# Patient Record
Sex: Male | Born: 1955
Health system: Southern US, Community
[De-identification: ages and names within clinical notes are randomized; demographics above are authoritative.]

## PROBLEM LIST (undated history)

## (undated) DIAGNOSIS — H269 Unspecified cataract: Secondary | ICD-10-CM

## (undated) HISTORY — DX: Unspecified cataract: H26.9

## (undated) HISTORY — PX: WISDOM TOOTH EXTRACTION: SHX21

---

## 2019-12-13 ENCOUNTER — Other Ambulatory Visit: Payer: Self-pay

## 2019-12-13 ENCOUNTER — Emergency Department (HOSPITAL_COMMUNITY): Payer: Medicaid Other

## 2019-12-13 ENCOUNTER — Inpatient Hospital Stay (HOSPITAL_COMMUNITY)
Admission: EM | Admit: 2019-12-13 | Discharge: 2019-12-23 | DRG: 638 | Disposition: A | Discharge status: Home Health | Payer: Medicaid Other | Attending: Family Medicine | Admitting: Family Medicine

## 2019-12-13 ENCOUNTER — Encounter (HOSPITAL_COMMUNITY): Payer: Self-pay | Admitting: Emergency Medicine

## 2019-12-13 DIAGNOSIS — R000 Tachycardia, unspecified: Secondary | ICD-10-CM

## 2019-12-13 DIAGNOSIS — R8281 Pyuria: Secondary | ICD-10-CM | POA: Diagnosis present

## 2019-12-13 DIAGNOSIS — K047 Periapical abscess without sinus: Secondary | ICD-10-CM | POA: Diagnosis present

## 2019-12-13 DIAGNOSIS — E1110 Type 2 diabetes mellitus with ketoacidosis without coma: Principal | ICD-10-CM | POA: Diagnosis present

## 2019-12-13 DIAGNOSIS — E861 Hypovolemia: Secondary | ICD-10-CM | POA: Diagnosis present

## 2019-12-13 DIAGNOSIS — R432 Parageusia: Secondary | ICD-10-CM | POA: Diagnosis present

## 2019-12-13 DIAGNOSIS — N390 Urinary tract infection, site not specified: Secondary | ICD-10-CM | POA: Diagnosis present

## 2019-12-13 DIAGNOSIS — R319 Hematuria, unspecified: Secondary | ICD-10-CM | POA: Diagnosis present

## 2019-12-13 DIAGNOSIS — N179 Acute kidney failure, unspecified: Secondary | ICD-10-CM | POA: Diagnosis present

## 2019-12-13 DIAGNOSIS — N1832 Chronic kidney disease, stage 3b: Secondary | ICD-10-CM | POA: Diagnosis present

## 2019-12-13 DIAGNOSIS — Z283 Underimmunization status: Secondary | ICD-10-CM

## 2019-12-13 DIAGNOSIS — I471 Supraventricular tachycardia: Secondary | ICD-10-CM | POA: Diagnosis present

## 2019-12-13 DIAGNOSIS — E1165 Type 2 diabetes mellitus with hyperglycemia: Secondary | ICD-10-CM | POA: Diagnosis present

## 2019-12-13 DIAGNOSIS — E1169 Type 2 diabetes mellitus with other specified complication: Secondary | ICD-10-CM | POA: Diagnosis present

## 2019-12-13 DIAGNOSIS — R439 Unspecified disturbances of smell and taste: Secondary | ICD-10-CM | POA: Diagnosis present

## 2019-12-13 DIAGNOSIS — E119 Type 2 diabetes mellitus without complications: Secondary | ICD-10-CM

## 2019-12-13 DIAGNOSIS — E669 Obesity, unspecified: Secondary | ICD-10-CM | POA: Diagnosis present

## 2019-12-13 DIAGNOSIS — Z6835 Body mass index (BMI) 35.0-35.9, adult: Secondary | ICD-10-CM

## 2019-12-13 DIAGNOSIS — E0810 Diabetes mellitus due to underlying condition with ketoacidosis without coma: Secondary | ICD-10-CM

## 2019-12-13 DIAGNOSIS — R430 Anosmia: Secondary | ICD-10-CM | POA: Diagnosis present

## 2019-12-13 DIAGNOSIS — E876 Hypokalemia: Secondary | ICD-10-CM | POA: Diagnosis not present

## 2019-12-13 DIAGNOSIS — E785 Hyperlipidemia, unspecified: Secondary | ICD-10-CM | POA: Diagnosis present

## 2019-12-13 DIAGNOSIS — D751 Secondary polycythemia: Secondary | ICD-10-CM | POA: Diagnosis present

## 2019-12-13 DIAGNOSIS — R600 Localized edema: Secondary | ICD-10-CM | POA: Diagnosis present

## 2019-12-13 DIAGNOSIS — E860 Dehydration: Secondary | ICD-10-CM | POA: Diagnosis present

## 2019-12-13 DIAGNOSIS — E872 Acidosis: Secondary | ICD-10-CM

## 2019-12-13 DIAGNOSIS — D1779 Benign lipomatous neoplasm of other sites: Secondary | ICD-10-CM | POA: Diagnosis present

## 2019-12-13 DIAGNOSIS — Z23 Encounter for immunization: Secondary | ICD-10-CM

## 2019-12-13 DIAGNOSIS — E1310 Other specified diabetes mellitus with ketoacidosis without coma: Secondary | ICD-10-CM

## 2019-12-13 DIAGNOSIS — E1129 Type 2 diabetes mellitus with other diabetic kidney complication: Secondary | ICD-10-CM | POA: Diagnosis present

## 2019-12-13 DIAGNOSIS — R1909 Other intra-abdominal and pelvic swelling, mass and lump: Secondary | ICD-10-CM

## 2019-12-13 DIAGNOSIS — E870 Hyperosmolality and hypernatremia: Secondary | ICD-10-CM | POA: Diagnosis present

## 2019-12-13 DIAGNOSIS — B951 Streptococcus, group B, as the cause of diseases classified elsewhere: Secondary | ICD-10-CM | POA: Diagnosis present

## 2019-12-13 DIAGNOSIS — R7989 Other specified abnormal findings of blood chemistry: Secondary | ICD-10-CM | POA: Diagnosis present

## 2019-12-13 DIAGNOSIS — E1122 Type 2 diabetes mellitus with diabetic chronic kidney disease: Secondary | ICD-10-CM | POA: Diagnosis present

## 2019-12-13 DIAGNOSIS — R739 Hyperglycemia, unspecified: Secondary | ICD-10-CM

## 2019-12-13 DIAGNOSIS — Z79899 Other long term (current) drug therapy: Secondary | ICD-10-CM

## 2019-12-13 DIAGNOSIS — Z20822 Contact with and (suspected) exposure to covid-19: Secondary | ICD-10-CM | POA: Diagnosis present

## 2019-12-13 DIAGNOSIS — I959 Hypotension, unspecified: Secondary | ICD-10-CM | POA: Diagnosis present

## 2019-12-13 DIAGNOSIS — R1110 Vomiting, unspecified: Secondary | ICD-10-CM

## 2019-12-13 DIAGNOSIS — E8729 Other acidosis: Secondary | ICD-10-CM | POA: Insufficient documentation

## 2019-12-13 DIAGNOSIS — I517 Cardiomegaly: Secondary | ICD-10-CM | POA: Diagnosis present

## 2019-12-13 LAB — BASIC METABOLIC PANEL
Anion gap: 29 — ABNORMAL HIGH (ref 5–15)
Anion gap: 28 — ABNORMAL HIGH (ref 5–15)
BUN: 30 mg/dL — ABNORMAL HIGH (ref 8–23)
BUN: 33 mg/dL — ABNORMAL HIGH (ref 8–23)
CO2: 9 mmol/L — ABNORMAL LOW (ref 22–32)
CO2: 9 mmol/L — ABNORMAL LOW (ref 22–32)
Calcium: 10 mg/dL (ref 8.9–10.3)
Calcium: 9.8 mg/dL (ref 8.9–10.3)
Chloride: 106 mmol/L (ref 98–111)
Chloride: 111 mmol/L (ref 98–111)
Creatinine, Ser: 1.84 mg/dL — ABNORMAL HIGH (ref 0.61–1.24)
Creatinine, Ser: 1.94 mg/dL — ABNORMAL HIGH (ref 0.61–1.24)
GFR calc Af Amer: 44 mL/min — ABNORMAL LOW (ref >60)
GFR calc Af Amer: 41 mL/min — ABNORMAL LOW (ref >60)
GFR calc non Af Amer: 38 mL/min — ABNORMAL LOW (ref >60)
GFR calc non Af Amer: 36 mL/min — ABNORMAL LOW (ref >60)
Glucose, Bld: 566 mg/dL (ref 70–99)
Glucose, Bld: 482 mg/dL — ABNORMAL HIGH (ref 70–99)
Potassium: 4.7 mmol/L (ref 3.5–5.1)
Potassium: 4.9 mmol/L (ref 3.5–5.1)
Sodium: 144 mmol/L (ref 135–145)
Sodium: 148 mmol/L — ABNORMAL HIGH (ref 135–145)

## 2019-12-13 LAB — I-STAT VENOUS BLOOD GAS, ED
Acid-base deficit: 13 mmol/L — ABNORMAL HIGH (ref 0.0–2.0)
Bicarbonate: 10.9 mmol/L — ABNORMAL LOW (ref 20.0–28.0)
Calcium, Ion: 1.19 mmol/L (ref 1.15–1.40)
HCT: 56 % — ABNORMAL HIGH (ref 39.0–52.0)
Hemoglobin: 19 g/dL — ABNORMAL HIGH (ref 13.0–17.0)
O2 Saturation: 86 %
Potassium: 4.8 mmol/L (ref 3.5–5.1)
Sodium: 150 mmol/L — ABNORMAL HIGH (ref 135–145)
TCO2: 12 mmol/L — ABNORMAL LOW (ref 22–32)
pCO2, Ven: 22.1 mmHg — ABNORMAL LOW (ref 44.0–60.0)
pH, Ven: 7.302 (ref 7.250–7.430)
pO2, Ven: 55 mmHg — ABNORMAL HIGH (ref 32.0–45.0)

## 2019-12-13 LAB — CBG MONITORING, ED
Glucose-Capillary: 556 mg/dL (ref 70–99)
Glucose-Capillary: 490 mg/dL — ABNORMAL HIGH (ref 70–99)
Glucose-Capillary: 490 mg/dL — ABNORMAL HIGH (ref 70–99)
Glucose-Capillary: 423 mg/dL — ABNORMAL HIGH (ref 70–99)
Glucose-Capillary: 423 mg/dL — ABNORMAL HIGH (ref 70–99)
Glucose-Capillary: 338 mg/dL — ABNORMAL HIGH (ref 70–99)
Glucose-Capillary: 312 mg/dL — ABNORMAL HIGH (ref 70–99)
Glucose-Capillary: 288 mg/dL — ABNORMAL HIGH (ref 70–99)
Glucose-Capillary: 280 mg/dL — ABNORMAL HIGH (ref 70–99)

## 2019-12-13 LAB — SARS CORONAVIRUS 2 BY RT PCR (HOSPITAL ORDER, PERFORMED IN ~~LOC~~ HOSPITAL LAB): SARS Coronavirus 2: NEGATIVE

## 2019-12-13 LAB — URINALYSIS, ROUTINE W REFLEX MICROSCOPIC
Bacteria, UA: NONE SEEN
Bilirubin Urine: NEGATIVE
Glucose, UA: >=500 mg/dL — AB
Ketones, ur: 80 mg/dL — AB
Leukocytes,Ua: NEGATIVE
Nitrite: NEGATIVE
Protein, ur: 100 mg/dL — AB
RBC / HPF: >50 RBC/hpf — ABNORMAL HIGH (ref 0–5)
Specific Gravity, Urine: 1.03 (ref 1.005–1.030)
pH: 5 (ref 5.0–8.0)

## 2019-12-13 LAB — CBC
HCT: 59.3 % — ABNORMAL HIGH (ref 39.0–52.0)
Hemoglobin: 18.8 g/dL — ABNORMAL HIGH (ref 13.0–17.0)
MCH: 30 pg (ref 26.0–34.0)
MCHC: 31.7 g/dL (ref 30.0–36.0)
MCV: 94.7 fL (ref 80.0–100.0)
Platelets: 265 10*3/uL (ref 150–400)
RBC: 6.26 MIL/uL — ABNORMAL HIGH (ref 4.22–5.81)
RDW: 12.8 % (ref 11.5–15.5)
WBC: 12.1 10*3/uL — ABNORMAL HIGH (ref 4.0–10.5)
nRBC: 0 % (ref 0.0–0.2)

## 2019-12-13 LAB — CBC WITH DIFFERENTIAL/PLATELET
Abs Immature Granulocytes: 0.06 10*3/uL (ref 0.00–0.07)
Basophils Absolute: 0 10*3/uL (ref 0.0–0.1)
Basophils Relative: 0 %
Eosinophils Absolute: 0 10*3/uL (ref 0.0–0.5)
Eosinophils Relative: 0 %
HCT: 57.6 % — ABNORMAL HIGH (ref 39.0–52.0)
Hemoglobin: 18.6 g/dL — ABNORMAL HIGH (ref 13.0–17.0)
Immature Granulocytes: 0 %
Lymphocytes Relative: 7 %
Lymphs Abs: 1 10*3/uL (ref 0.7–4.0)
MCH: 30.7 pg (ref 26.0–34.0)
MCHC: 32.3 g/dL (ref 30.0–36.0)
MCV: 95 fL (ref 80.0–100.0)
Monocytes Absolute: 1 10*3/uL (ref 0.1–1.0)
Monocytes Relative: 7 %
Neutro Abs: 12.3 10*3/uL — ABNORMAL HIGH (ref 1.7–7.7)
Neutrophils Relative %: 86 %
Platelets: 237 10*3/uL (ref 150–400)
RBC: 6.06 MIL/uL — ABNORMAL HIGH (ref 4.22–5.81)
RDW: 13.2 % (ref 11.5–15.5)
WBC: 14.4 10*3/uL — ABNORMAL HIGH (ref 4.0–10.5)
nRBC: 0 % (ref 0.0–0.2)

## 2019-12-13 LAB — LIPID PANEL
Cholesterol: 304 mg/dL — ABNORMAL HIGH (ref 0–200)
HDL: 33 mg/dL — ABNORMAL LOW (ref >40)
LDL Cholesterol: 211 mg/dL — ABNORMAL HIGH (ref 0–99)
Total CHOL/HDL Ratio: 9.2 RATIO
Triglycerides: 300 mg/dL — ABNORMAL HIGH (ref <150)
VLDL: 60 mg/dL — ABNORMAL HIGH (ref 0–40)

## 2019-12-13 LAB — HEPATIC FUNCTION PANEL
ALT: 47 U/L — ABNORMAL HIGH (ref 0–44)
AST: 26 U/L (ref 15–41)
Albumin: 3.9 g/dL (ref 3.5–5.0)
Alkaline Phosphatase: 147 U/L — ABNORMAL HIGH (ref 38–126)
Bilirubin, Direct: 0.2 mg/dL (ref 0.0–0.2)
Indirect Bilirubin: 2.2 mg/dL — ABNORMAL HIGH (ref 0.3–0.9)
Total Bilirubin: 2.4 mg/dL — ABNORMAL HIGH (ref 0.3–1.2)
Total Protein: 8.9 g/dL — ABNORMAL HIGH (ref 6.5–8.1)

## 2019-12-13 LAB — HEMOGLOBIN A1C
Hgb A1c MFr Bld: 13.2 % — ABNORMAL HIGH (ref 4.8–5.6)
Mean Plasma Glucose: 332.14 mg/dL

## 2019-12-13 LAB — BETA-HYDROXYBUTYRIC ACID: Beta-Hydroxybutyric Acid: >8 mmol/L — ABNORMAL HIGH (ref 0.05–0.27)

## 2019-12-13 LAB — BRAIN NATRIURETIC PEPTIDE: B Natriuretic Peptide: 686.3 pg/mL — ABNORMAL HIGH (ref 0.0–100.0)

## 2019-12-13 LAB — HIV ANTIBODY (ROUTINE TESTING W REFLEX): HIV Screen 4th Generation wRfx: NONREACTIVE

## 2019-12-13 MED ORDER — LACTATED RINGERS IV SOLN: INTRAVENOUS | Status: DC

## 2019-12-13 MED ORDER — POTASSIUM CHLORIDE 10 MEQ/100ML IV SOLN
10.0000 meq | INTRAVENOUS | Status: AC
  Administered 2019-12-13 (×2): 10 meq via INTRAVENOUS
  Filled 2019-12-13: qty 100

## 2019-12-13 MED ORDER — POTASSIUM CHLORIDE 10 MEQ/100ML IV SOLN
10.0000 meq | INTRAVENOUS | Status: AC
  Filled 2019-12-13: qty 100

## 2019-12-13 MED ORDER — SODIUM CHLORIDE 0.9 % IV BOLUS
1000.0000 mL | Freq: Once | INTRAVENOUS | Status: AC
  Administered 2019-12-13: 1000 mL via INTRAVENOUS

## 2019-12-13 MED ORDER — LACTATED RINGERS IV BOLUS
20.0000 mL/kg | Freq: Once | INTRAVENOUS | Status: AC
  Administered 2019-12-13: 1000 mL via INTRAVENOUS

## 2019-12-13 MED ORDER — DEXTROSE IN LACTATED RINGERS 5 % IV SOLN: INTRAVENOUS | Status: DC

## 2019-12-13 MED ORDER — DEXTROSE 50 % IV SOLN: 0.0000 mL | INTRAVENOUS | Status: DC | PRN

## 2019-12-13 MED ORDER — INSULIN REGULAR(HUMAN) IN NACL 100-0.9 UT/100ML-% IV SOLN
INTRAVENOUS | Status: DC
  Administered 2019-12-13: 6 [IU]/h via INTRAVENOUS
  Administered 2019-12-14: 3.6 [IU]/h via INTRAVENOUS
  Filled 2019-12-13 (×2): qty 100

## 2019-12-13 MED ORDER — ENOXAPARIN SODIUM 40 MG/0.4ML ~~LOC~~ SOLN
40.0000 mg | SUBCUTANEOUS | Status: DC
  Administered 2019-12-14: 40 mg via SUBCUTANEOUS
  Filled 2019-12-13: qty 0.4

## 2019-12-13 NOTE — ED Triage Notes (Signed)
Pt reports no taste or smell x 2 weeks with generalized weakness and numbness to bilateral feet.  Denies pain.

## 2019-12-13 NOTE — H&P (Addendum)
[facility] Admission History and Physical Service Pager: [phone]  Patient name: Bill Torres Medical record number: [id number] Date of birth: 02/20/1956 Age: 64 y.o. Gender: male  Primary Care Provider: Patient, No Pcp Per Consultants: None Code Status: Full Preferred Emergency Contact: Kallie Locks (daughter) [phone]  Chief Complaint: Generalized weakness  Assessment and Plan: Isao Seltzer is a 64 y.o. male presenting with generalized weakness and hyperglycemia, likely DKA. PMH is significant for obesity. Has never seen a doctor.  DKA  Newly diagnosed T2DM Patient presenting with tachycardia and hyperglycemia with glucose 566 on presentation with anion gap metabolic acidosis (bicarb 9, anion gap 29, pH decreased at 7.3).  EKG showing sinus tachycardia without arrhythmia, likely in the setting of dehydration.  Suspect newly diagnosed DM in DKA, BHBA significantly elevated at >8.  UA on admission showing greater than 500 glucose, 80 ketones, 100 protein, no evidence of infection.  Mild leukocytosis with WBC 12.1 which can be seen with DKA.  Covid negative. Patient was started on insulin drip per protocol in the ED. - admit to progressive, FPTS, Dr. McDiarmid - continue insulin drip per protocol - CBG monitoring per protocol - f/u BHB q8h - BMP q4h - other labs: HbA1c, urine microalbumin - strict I/O - cardiac monitoring, continuous pulse ox - vitals per unit routine  Anosmia  unvaccinated against Covid-19 Patient reports several days of loss of smell, taste.  No known exposures to COVID-19.  Covid test on admission is negative.  He is unvaccinated.  No symptoms of cough, chest pain, dyspnea.  CXR unremarkable, other than some possible atelectasis.  Tachycardic, but otherwise stable.  Breathing comfortably on room air.  Unclear etiology of anosmia. -Continue to monitor -Patient would benefit from inpatient COVID-19 vaccine if  amendable  ?AKI, CKD3b Elevated creatinine 1.84 on admission, unclear baseline.  Likely prerenal in the setting of dehydration, though creatinine level could be patient's baseline.  Could possibly have underlying CKD given lack of medical care.  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  - IV fluids - BMP - Continue to monitor fluid status  Hyperlipidemia: Lipid panel on admission shows cholesterol 304, triglycerides 300, HDL low at 33, VLDL elevated at 60, and LDL elevated at 211.  Patient's ASCVD risk 16%. -Patient would benefit from statin once no longer n.p.o.  Lower extremity edema Patient with 1+ pitting edema bilaterally on exam.  CXR without evidence of pulmonary edema.  BNP elevated at 686. -Recommend following up with TTE  FEN/GI: NPO, insulin drip per protocol Prophylaxis: enoxaparin  Disposition: progressive  History of Present Illness:  Bill Torres is a 64 y.o. male presenting with generalized weakness and fatigue.  Patient has noticed decreased smell and taste for the past 2 weeks.  He has not been eating as much as a result, and has been eating primarily fruit and drinking ginger ale to try to get his taste back.  He does have some taste but states it is very diminished.  As a result, he has also had fatigue and generalized weakness as well.  He also endorses polyuria (sometimes voiding 3 times in a night).  Denies nausea, vomiting.  He is not vaccinated against COVID. No known exposures.  Patient states he mostly stays at home and always wears a mask when he leaves the house.  Denies chest pain, shortness of breath, cough, diarrhea, vision changes.  Nobody at home is ill, no sick contacts.  Review Of Systems: Per HPI  with the following additions: no headache, neck pain  Patient Active Problem List   Diagnosis Date Noted  . Diabetic ketoacidosis associated with diabetes mellitus due to underlying condition ([place]) 12/13/2019    Past Medical  History: History reviewed. No pertinent past medical history. No known PMH Takes multiple vitamins  Past Surgical History: History reviewed. No pertinent surgical history.  No surgical history  Social History: Social History   Tobacco Use  . Smoking status: Never Smoker  . Smokeless tobacco: Never Used  Substance Use Topics  . Alcohol use: Not Currently  . Drug use: Not Currently   Additional social history: lives with daughter and 2 children.  Denies smoking, alcohol use, recreational drug use. Please also refer to relevant sections of EMR.  Family History: No family history on file. Mom: no known medical history Dad: no known medical history Daughter: T58DM, 74 years old  Allergies and Medications: No Known Allergies No current facility-administered medications on file prior to encounter.   No current outpatient medications on file prior to encounter.  No medications  Objective: BP (!) 138/107 (BP Location: Right Arm)   Pulse (!) 133   Temp 99.4 F (37.4 C) (Oral)   Resp 16   Ht 6\' 1"  (1.854 m)   Wt 120.2 kg   SpO2 97%   BMI 34.96 kg/m  Exam: General: Obese male lying in bed, NAD Eyes: EOMI ENTM: MM dry Neck: supple Cardiovascular: Tachycardic, regular rhythm, no murmurs, pulses intact, 1+ pitting edema Respiratory: CTAB, breathing comfortably on room air, no tachypnea Gastrointestinal: soft, non-tender, +BS MSK: moving extremities Derm: no rash, cool extremities Neuro: alert, cooperative, interactive, sensation intact to bilateral feet to light touch, CN II-XII grossly intact Psych: affect appropriate  Labs and Imaging: CBC BMET  Recent Labs  Lab 12/13/19 0856  WBC 12.1*  HGB 18.8*  HCT 59.3*  PLT 265   Recent Labs  Lab 12/13/19 0856  NA 144  K 4.7  CL 106  CO2 9*  BUN 30*  CREATININE 1.84*  GLUCOSE 566*  CALCIUM 10.0     EKG:  Date/Time:  Saturday December 13 2019 08:58:50 EDT Ventricular Rate:  131 PR Interval:  144 QRS  Duration: 76 QT Interval:  296 QTC Calculation: 437 R Axis:   5 Text Interpretation: Sinus tachycardia Otherwise normal ECG No prior ECG for comparison. No STEMI Confirmed by Antony Blackbird [phone]) on 12/13/2019 12:56:03 PM   Zola Button, MD 12/13/2019, 4:16 PM PGY-1, [place] Intern pager: [phone], text pages welcome   FPTS Upper-Level Resident Addendum  I have independently interviewed and examined the patient. I have discussed the above with the original author and agree with their documentation. My edits for correction/addition/clarification are in italics. Please see also any attending notes.    Milus Banister, DO PGY-3, [facility] 12/13/2019 7:58 PM  [place] Service pager: [phone] (text pages welcome through [facility])

## 2019-12-13 NOTE — ED Provider Notes (Signed)
[name] Provider Note   CSN: [id number] Arrival date & time: 12/13/19  6283     History Chief Complaint  Patient presents with  . Weakness    Bill Torres is a 64 y.o. male.  The history is provided by the patient and medical records. No language interpreter was used.  Hyperglycemia Blood sugar level PTA:  Not checked Severity:  Severe Timing:  Constant Progression:  Unchanged Chronicity:  New Diabetes status:  Non-diabetic Context: new diabetes diagnosis (today)   Context: not change in medication   Relieved by:  Nothing Ineffective treatments:  None tried Associated symptoms: dehydration, fatigue, increased thirst and polyuria   Associated symptoms: no abdominal pain, no altered mental status, no blurred vision, no chest pain, no confusion, no diaphoresis, no dizziness, no dysuria, no fever, no malaise, no nausea, no shortness of breath, no vomiting, no weakness and no weight change   Risk factors: obesity        History reviewed. No pertinent past medical history.  There are no problems to display for this patient.   History reviewed. No pertinent surgical history.     No family history on file.  Social History   Tobacco Use  . Smoking status: Never Smoker  . Smokeless tobacco: Never Used  Substance Use Topics  . Alcohol use: Not Currently  . Drug use: Not Currently    Home Medications Prior to Admission medications   Not on File    Allergies    Patient has no allergy information on record.  Review of Systems   Review of Systems  Constitutional: Positive for fatigue. Negative for chills, diaphoresis and fever.  Eyes: Negative for blurred vision and visual disturbance.  Respiratory: Negative for cough, chest tightness, shortness of breath and wheezing.   Cardiovascular: Negative for chest pain, palpitations and leg swelling.  Gastrointestinal: Negative for abdominal pain, constipation,  diarrhea, nausea and vomiting.  Endocrine: Positive for polydipsia and polyuria.  Genitourinary: Positive for frequency. Negative for dysuria.  Musculoskeletal: Negative for back pain and neck pain.  Skin: Negative for rash and wound.  Neurological: Negative for dizziness, weakness, light-headedness and headaches.  Psychiatric/Behavioral: Negative for agitation and confusion.  All other systems reviewed and are negative.   Physical Exam Updated Vital Signs BP (!) 138/107 (BP Location: Right Arm)   Pulse (!) 133   Temp 99.4 F (37.4 C) (Oral)   Resp 16   Ht 6\' 1"  (1.854 m)   Wt 120.2 kg   SpO2 97%   BMI 34.96 kg/m   Physical Exam Vitals and nursing note reviewed.  Constitutional:      General: He is not in acute distress.    Appearance: He is well-developed. He is not ill-appearing, toxic-appearing or diaphoretic.  HENT:     Head: Normocephalic and atraumatic.     Nose: No congestion or rhinorrhea.     Mouth/Throat:     Mouth: Mucous membranes are dry.     Pharynx: No oropharyngeal exudate or posterior oropharyngeal erythema.  Eyes:     Extraocular Movements: Extraocular movements intact.     Conjunctiva/sclera: Conjunctivae normal.     Pupils: Pupils are equal, round, and reactive to light.  Cardiovascular:     Rate and Rhythm: Regular rhythm. Tachycardia present.     Pulses: Normal pulses.     Heart sounds: No murmur heard.   Pulmonary:     Effort: Pulmonary effort is normal. No respiratory distress.  Breath sounds: Normal breath sounds. No wheezing, rhonchi or rales.  Chest:     Chest wall: No tenderness.  Abdominal:     General: Abdomen is flat.     Palpations: Abdomen is soft.     Tenderness: There is no abdominal tenderness. There is no right CVA tenderness, left CVA tenderness, guarding or rebound.  Musculoskeletal:        General: No tenderness.     Cervical back: Neck supple.     Right lower leg: No edema.     Left lower leg: No edema.  Skin:     General: Skin is warm and dry.     Capillary Refill: Capillary refill takes less than 2 seconds.     Findings: No erythema.  Neurological:     General: No focal deficit present.     Mental Status: He is alert.  Psychiatric:        Mood and Affect: Mood normal.     ED Results / Procedures / Treatments   Labs (all labs ordered are listed, but only abnormal results are displayed) Labs Reviewed  BASIC METABOLIC PANEL - Abnormal; Notable for the following components:      Result Value   CO2 9 (*)    Glucose, Bld 566 (*)    BUN 30 (*)    Creatinine, Ser 1.84 (*)    GFR calc non Af Amer 38 (*)    GFR calc Af Amer 44 (*)    Anion gap 29 (*)    All other components within normal limits  CBC - Abnormal; Notable for the following components:   WBC 12.1 (*)    RBC 6.26 (*)    Hemoglobin 18.8 (*)    HCT 59.3 (*)    All other components within normal limits  CBG MONITORING, ED - Abnormal; Notable for the following components:   Glucose-Capillary 556 (*)    All other components within normal limits  URINE CULTURE  SARS CORONAVIRUS 2 BY RT PCR (HOSPITAL ORDER, [place] [facility])  URINALYSIS, ROUTINE W REFLEX MICROSCOPIC  BETA-HYDROXYBUTYRIC ACID  BETA-HYDROXYBUTYRIC ACID  CBC WITH DIFFERENTIAL/PLATELET  HEPATIC FUNCTION PANEL  HEMOGLOBIN A1C  LIPID PANEL  MICROALBUMIN / CREATININE URINE RATIO  HIV ANTIBODY (ROUTINE TESTING W REFLEX)  CBG MONITORING, ED  I-STAT VENOUS BLOOD GAS, ED    EKG EKG Interpretation  Date/Time:  Saturday December 13 2019 08:58:50 EDT Ventricular Rate:  131 PR Interval:  144 QRS Duration: 76 QT Interval:  296 QTC Calculation: 437 R Axis:   5 Text Interpretation: Sinus tachycardia Otherwise normal ECG No prior ECG for comparison. No STEMI Confirmed by Antony Blackbird [phone]) on 12/13/2019 12:56:03 PM   Radiology DG Chest 1 View  Result Date: 12/13/2019 CLINICAL DATA:  No taste or smell for 2 weeks. Generalized weakness. EXAM:  CHEST  1 VIEW COMPARISON:  None. FINDINGS: Cardiomediastinal silhouette is normal. No pneumothorax. Mild opacity in left base favored to be atelectasis. No other acute abnormalities. IMPRESSION: Mild opacity in left base is favored represent atelectasis. No other acute abnormalities. Electronically Signed   By: Dorise Bullion III M.D   On: 12/13/2019 14:35    Procedures Procedures (including critical care time)  CRITICAL CARE Performed by: Gwenyth Allegra Ronniesha Seibold Total critical care time: 30 minutes Critical care time was exclusive of separately billable procedures and treating other patients. Critical care was necessary to treat or prevent imminent or life-threatening deterioration. Critical care was time spent personally by me on  the following activities: development of treatment plan with patient and/or surrogate as well as nursing, discussions with consultants, evaluation of patient's response to treatment, examination of patient, obtaining history from patient or surrogate, ordering and performing treatments and interventions, ordering and review of laboratory studies, ordering and review of radiographic studies, pulse oximetry and re-evaluation of patient's condition.   Medications Ordered in ED Medications  insulin regular, human (MYXREDLIN) 100 units/ 100 mL infusion (6 Units/hr Intravenous New Bag/Given 12/13/19 1503)  lactated ringers infusion (has no administration in time range)  dextrose 5 % in lactated ringers infusion (has no administration in time range)  dextrose 50 % solution 0-50 mL (has no administration in time range)  potassium chloride 10 mEq in 100 mL IVPB (has no administration in time range)  sodium chloride 0.9 % bolus 1,000 mL (has no administration in time range)  lactated ringers bolus 2,404 mL (1,000 mLs Intravenous New Bag/Given 12/13/19 1459)    ED Course  I have reviewed the triage vital signs and the nursing notes.  Pertinent labs & imaging results that  were available during my care of the patient were reviewed by me and considered in my medical decision making (see chart for details).    MDM Rules/Calculators/A&P                          Bill Torres is a 64 y.o. male with no significant past medical history who presents with loss of smell and taste for last 2 weeks, polyuria, and fatigue. Patient reports that he has not been vaccinated for COVID-19 and does not have any medical problems. He reports he does not take medicines. He says that for the last 2 weeks has been feeling very tired and fatigued. He said he also had increase in urination but denies dysuria. He reports no fevers, chills, chest, or cough. Denies chest pain, palpitations, shortness of breath. On arrival, he was found to be tachycardic but not hypotensive or hypoxic. Temperature was 99.4 orally.  Patient had screening blood work in triage which reveals that he does appear to be in DKA.  On exam, lungs are clear and chest is nontender. Abdomen is nontender. He is tachycardic in the 120s to 130s. His mouth is very dry on exam. He reports feeling fatigued and lightheaded.  Labs show DKA, will get work-up to look for occult infection and Covid given the loss of taste and smell. We will start him on insulin drip and fluids. He will be admitted for new onset diabetes and DKA with possible COVID-19 or other occult infection contributing.  He will be admitted after work-up.     All the other DKA orders were placed and he will be admitted for further management.  Final Clinical Impression(s) / ED Diagnoses Final diagnoses:  Tachycardia  Diabetes mellitus, new onset ([place])  Diabetic ketoacidosis without coma associated with other specified diabetes mellitus ([place])     Clinical Impression: 1. Diabetes mellitus, new onset ([place])   2. Tachycardia   3. Diabetic ketoacidosis without coma associated with other specified diabetes mellitus ([place])     Disposition: Admit  This  note was prepared with assistance of Dragon voice recognition software. Occasional wrong-word or sound-a-like substitutions may have occurred due to the inherent limitations of voice recognition software.     Danaye Sobh, Gwenyth Allegra, MD 12/13/19 [phone]

## 2019-12-14 ENCOUNTER — Observation Stay (HOSPITAL_COMMUNITY): Payer: Medicaid Other

## 2019-12-14 ENCOUNTER — Encounter (HOSPITAL_COMMUNITY): Payer: Self-pay | Admitting: Family Medicine

## 2019-12-14 DIAGNOSIS — Z283 Underimmunization status: Secondary | ICD-10-CM | POA: Diagnosis not present

## 2019-12-14 DIAGNOSIS — N179 Acute kidney failure, unspecified: Secondary | ICD-10-CM | POA: Diagnosis present

## 2019-12-14 DIAGNOSIS — E870 Hyperosmolality and hypernatremia: Secondary | ICD-10-CM | POA: Diagnosis present

## 2019-12-14 DIAGNOSIS — R439 Unspecified disturbances of smell and taste: Secondary | ICD-10-CM | POA: Diagnosis present

## 2019-12-14 DIAGNOSIS — I471 Supraventricular tachycardia: Secondary | ICD-10-CM | POA: Diagnosis present

## 2019-12-14 DIAGNOSIS — R7989 Other specified abnormal findings of blood chemistry: Secondary | ICD-10-CM | POA: Diagnosis present

## 2019-12-14 DIAGNOSIS — R432 Parageusia: Secondary | ICD-10-CM | POA: Diagnosis present

## 2019-12-14 DIAGNOSIS — D1779 Benign lipomatous neoplasm of other sites: Secondary | ICD-10-CM | POA: Diagnosis present

## 2019-12-14 DIAGNOSIS — E1110 Type 2 diabetes mellitus with ketoacidosis without coma: Secondary | ICD-10-CM | POA: Diagnosis present

## 2019-12-14 DIAGNOSIS — E1165 Type 2 diabetes mellitus with hyperglycemia: Secondary | ICD-10-CM | POA: Diagnosis present

## 2019-12-14 DIAGNOSIS — E785 Hyperlipidemia, unspecified: Secondary | ICD-10-CM

## 2019-12-14 DIAGNOSIS — E1169 Type 2 diabetes mellitus with other specified complication: Secondary | ICD-10-CM

## 2019-12-14 DIAGNOSIS — E861 Hypovolemia: Secondary | ICD-10-CM | POA: Diagnosis present

## 2019-12-14 DIAGNOSIS — E860 Dehydration: Secondary | ICD-10-CM | POA: Diagnosis present

## 2019-12-14 DIAGNOSIS — R8281 Pyuria: Secondary | ICD-10-CM | POA: Diagnosis present

## 2019-12-14 DIAGNOSIS — I517 Cardiomegaly: Secondary | ICD-10-CM | POA: Diagnosis present

## 2019-12-14 DIAGNOSIS — E1129 Type 2 diabetes mellitus with other diabetic kidney complication: Secondary | ICD-10-CM | POA: Diagnosis present

## 2019-12-14 DIAGNOSIS — N390 Urinary tract infection, site not specified: Secondary | ICD-10-CM | POA: Diagnosis present

## 2019-12-14 DIAGNOSIS — R430 Anosmia: Secondary | ICD-10-CM | POA: Diagnosis present

## 2019-12-14 DIAGNOSIS — D751 Secondary polycythemia: Secondary | ICD-10-CM | POA: Diagnosis present

## 2019-12-14 DIAGNOSIS — B951 Streptococcus, group B, as the cause of diseases classified elsewhere: Secondary | ICD-10-CM | POA: Diagnosis present

## 2019-12-14 DIAGNOSIS — R600 Localized edema: Secondary | ICD-10-CM

## 2019-12-14 DIAGNOSIS — E669 Obesity, unspecified: Secondary | ICD-10-CM | POA: Diagnosis present

## 2019-12-14 DIAGNOSIS — E1310 Other specified diabetes mellitus with ketoacidosis without coma: Secondary | ICD-10-CM | POA: Diagnosis not present

## 2019-12-14 DIAGNOSIS — Z6835 Body mass index (BMI) 35.0-35.9, adult: Secondary | ICD-10-CM | POA: Diagnosis not present

## 2019-12-14 DIAGNOSIS — K047 Periapical abscess without sinus: Secondary | ICD-10-CM | POA: Diagnosis present

## 2019-12-14 DIAGNOSIS — Z23 Encounter for immunization: Secondary | ICD-10-CM | POA: Diagnosis not present

## 2019-12-14 DIAGNOSIS — Z20822 Contact with and (suspected) exposure to covid-19: Secondary | ICD-10-CM | POA: Diagnosis present

## 2019-12-14 DIAGNOSIS — E1122 Type 2 diabetes mellitus with diabetic chronic kidney disease: Secondary | ICD-10-CM | POA: Diagnosis present

## 2019-12-14 DIAGNOSIS — R1909 Other intra-abdominal and pelvic swelling, mass and lump: Secondary | ICD-10-CM | POA: Diagnosis present

## 2019-12-14 DIAGNOSIS — R319 Hematuria, unspecified: Secondary | ICD-10-CM | POA: Diagnosis present

## 2019-12-14 HISTORY — DX: Hyperlipidemia, unspecified: E78.5

## 2019-12-14 HISTORY — DX: Type 2 diabetes mellitus with other specified complication: E11.69

## 2019-12-14 LAB — BASIC METABOLIC PANEL
Anion gap: 15 (ref 5–15)
Anion gap: 17 — ABNORMAL HIGH (ref 5–15)
Anion gap: 15 (ref 5–15)
BUN: 26 mg/dL — ABNORMAL HIGH (ref 8–23)
BUN: 27 mg/dL — ABNORMAL HIGH (ref 8–23)
BUN: 20 mg/dL (ref 8–23)
CO2: 18 mmol/L — ABNORMAL LOW (ref 22–32)
CO2: 19 mmol/L — ABNORMAL LOW (ref 22–32)
CO2: 18 mmol/L — ABNORMAL LOW (ref 22–32)
Calcium: 9.6 mg/dL (ref 8.9–10.3)
Calcium: 9.6 mg/dL (ref 8.9–10.3)
Calcium: 8.9 mg/dL (ref 8.9–10.3)
Chloride: 119 mmol/L — ABNORMAL HIGH (ref 98–111)
Chloride: 116 mmol/L — ABNORMAL HIGH (ref 98–111)
Chloride: 118 mmol/L — ABNORMAL HIGH (ref 98–111)
Creatinine, Ser: 1.38 mg/dL — ABNORMAL HIGH (ref 0.61–1.24)
Creatinine, Ser: 1.37 mg/dL — ABNORMAL HIGH (ref 0.61–1.24)
Creatinine, Ser: 1.43 mg/dL — ABNORMAL HIGH (ref 0.61–1.24)
GFR calc Af Amer: >60 mL/min (ref >60)
GFR calc Af Amer: >60 mL/min (ref >60)
GFR calc Af Amer: 60 mL/min — ABNORMAL LOW (ref >60)
GFR calc non Af Amer: 54 mL/min — ABNORMAL LOW (ref >60)
GFR calc non Af Amer: 55 mL/min — ABNORMAL LOW (ref >60)
GFR calc non Af Amer: 52 mL/min — ABNORMAL LOW (ref >60)
Glucose, Bld: 224 mg/dL — ABNORMAL HIGH (ref 70–99)
Glucose, Bld: 184 mg/dL — ABNORMAL HIGH (ref 70–99)
Glucose, Bld: 388 mg/dL — ABNORMAL HIGH (ref 70–99)
Potassium: 4.1 mmol/L (ref 3.5–5.1)
Potassium: 4.4 mmol/L (ref 3.5–5.1)
Potassium: 4 mmol/L (ref 3.5–5.1)
Sodium: 152 mmol/L — ABNORMAL HIGH (ref 135–145)
Sodium: 152 mmol/L — ABNORMAL HIGH (ref 135–145)
Sodium: 151 mmol/L — ABNORMAL HIGH (ref 135–145)

## 2019-12-14 LAB — GLUCOSE, CAPILLARY
Glucose-Capillary: 363 mg/dL — ABNORMAL HIGH (ref 70–99)
Glucose-Capillary: 203 mg/dL — ABNORMAL HIGH (ref 70–99)
Glucose-Capillary: 186 mg/dL — ABNORMAL HIGH (ref 70–99)
Glucose-Capillary: 172 mg/dL — ABNORMAL HIGH (ref 70–99)
Glucose-Capillary: 180 mg/dL — ABNORMAL HIGH (ref 70–99)
Glucose-Capillary: 191 mg/dL — ABNORMAL HIGH (ref 70–99)
Glucose-Capillary: 350 mg/dL — ABNORMAL HIGH (ref 70–99)
Glucose-Capillary: 302 mg/dL — ABNORMAL HIGH (ref 70–99)
Glucose-Capillary: 346 mg/dL — ABNORMAL HIGH (ref 70–99)

## 2019-12-14 LAB — ECHOCARDIOGRAM COMPLETE
Area-P 1/2: 5.38 cm2
Height: 73 in
S' Lateral: 2.6 cm
Single Plane A4C EF: 73.8 %
Weight: 4246.94 oz

## 2019-12-14 LAB — URINE CULTURE: Culture: >=100000 — AB

## 2019-12-14 LAB — MICROALBUMIN / CREATININE URINE RATIO
Creatinine, Urine: 54.5 mg/dL
Microalb Creat Ratio: 610 mg/g creat — ABNORMAL HIGH (ref 0–29)
Microalb, Ur: 332.5 ug/mL — ABNORMAL HIGH

## 2019-12-14 LAB — BETA-HYDROXYBUTYRIC ACID
Beta-Hydroxybutyric Acid: 3.17 mmol/L — ABNORMAL HIGH (ref 0.05–0.27)
Beta-Hydroxybutyric Acid: 3.39 mmol/L — ABNORMAL HIGH (ref 0.05–0.27)

## 2019-12-14 MED ORDER — PERFLUTREN LIPID MICROSPHERE
1.0000 mL | INTRAVENOUS | Status: AC | PRN
  Administered 2019-12-14: 2 mL via INTRAVENOUS
  Filled 2019-12-14: qty 10

## 2019-12-14 MED ORDER — ATORVASTATIN CALCIUM 40 MG PO TABS: 40.0000 mg | ORAL_TABLET | Freq: Every day | ORAL | Status: DC

## 2019-12-14 MED ORDER — ONDANSETRON HCL 4 MG/2ML IJ SOLN
4.0000 mg | Freq: Four times a day (QID) | INTRAMUSCULAR | Status: DC
  Administered 2019-12-14 – 2019-12-17 (×10): 4 mg via INTRAVENOUS
  Filled 2019-12-14 (×11): qty 2

## 2019-12-14 MED ORDER — INSULIN GLARGINE 100 UNIT/ML ~~LOC~~ SOLN
5.0000 [IU] | SUBCUTANEOUS | Status: DC
  Administered 2019-12-14: 5 [IU] via SUBCUTANEOUS
  Filled 2019-12-14: qty 0.05

## 2019-12-14 MED ORDER — INSULIN GLARGINE 100 UNIT/ML ~~LOC~~ SOLN
15.0000 [IU] | Freq: Every day | SUBCUTANEOUS | Status: DC
  Administered 2019-12-14 – 2019-12-15 (×2): 15 [IU] via SUBCUTANEOUS
  Filled 2019-12-14 (×2): qty 0.15

## 2019-12-14 MED ORDER — INSULIN ASPART 100 UNIT/ML ~~LOC~~ SOLN
0.0000 [IU] | Freq: Three times a day (TID) | SUBCUTANEOUS | Status: DC
  Administered 2019-12-14 (×2): 7 [IU] via SUBCUTANEOUS

## 2019-12-14 MED ORDER — INSULIN GLARGINE 100 UNIT/ML ~~LOC~~ SOLN
30.0000 [IU] | SUBCUTANEOUS | Status: DC
  Filled 2019-12-14: qty 0.3

## 2019-12-14 MED ORDER — PNEUMOCOCCAL VAC POLYVALENT 25 MCG/0.5ML IJ INJ: 0.5000 mL | INJECTION | INTRAMUSCULAR | Status: DC | PRN

## 2019-12-14 MED ORDER — ATORVASTATIN CALCIUM 80 MG PO TABS
80.0000 mg | ORAL_TABLET | Freq: Every day | ORAL | Status: DC
  Administered 2019-12-16 – 2019-12-23 (×8): 80 mg via ORAL
  Filled 2019-12-14 (×9): qty 1

## 2019-12-14 MED ORDER — LIVING WELL WITH DIABETES BOOK
Freq: Once | Status: AC
  Filled 2019-12-14: qty 1

## 2019-12-14 MED ORDER — INSULIN ASPART 100 UNIT/ML ~~LOC~~ SOLN
0.0000 [IU] | SUBCUTANEOUS | Status: DC
  Administered 2019-12-14: 7 [IU] via SUBCUTANEOUS
  Administered 2019-12-15 (×2): 9 [IU] via SUBCUTANEOUS
  Administered 2019-12-15 – 2019-12-16 (×5): 7 [IU] via SUBCUTANEOUS
  Administered 2019-12-16 (×2): 9 [IU] via SUBCUTANEOUS
  Administered 2019-12-16 (×2): 7 [IU] via SUBCUTANEOUS

## 2019-12-14 MED ORDER — DEXTROSE-NACL 5-0.45 % IV SOLN: INTRAVENOUS | Status: DC

## 2019-12-14 NOTE — Progress Notes (Signed)
Inpatient Diabetes Program Recommendations  AACE/ADA: New Consensus Statement on Inpatient Glycemic Control (2015)  Target Ranges:  Prepandial:   less than 140 mg/dL      Peak postprandial:   less than 180 mg/dL (1-2 hours)      Critically ill patients:  140 - 180 mg/dL   Lab Results  Component Value Date   GLUCAP 191 (H) 12/14/2019   HGBA1C 13.2 (H) 12/13/2019    Review of Glycemic Control  Diabetes history: New DM/new insulin  Current orders for Inpatient glycemic control: IV insulin transitioning to SQ this am  Inpatient Diabetes Program Recommendations:    - Based on A1c level, and insulin gtt rates (5 units/hour) increase Lantus to 35 units.  Noted no insurance. New DM and needs new insulin. Ordered the living well with DM education booklet for education to start. Education by bedside nursing staff.  DM Coordinator is not on campus over the weekend but is available by phone. DM Coordinator will see tomorrow and discuss insulin.   Thanks,  Tama Headings RN, MSN, BC-ADM Inpatient Diabetes Coordinator Team Pager [phone] (8a-5p)

## 2019-12-14 NOTE — Progress Notes (Signed)
  Echocardiogram 2D Echocardiogram with contrast has been performed.  Merrie Roof F 12/14/2019, 9:43 AM

## 2019-12-14 NOTE — Progress Notes (Signed)
   12/14/19 1845  Assess: MEWS Score  Temp 98.2 F (36.8 C)  BP (!) 144/84  Pulse Rate (!) 120  ECG Heart Rate (!) 120  SpO2 97 %  O2 Device Room Air  Patient Activity (if Appropriate) In bed  Assess: MEWS Score  MEWS Temp 0  MEWS Systolic 0  MEWS Pulse 2  MEWS RR 0  MEWS LOC 0  MEWS Score 2  MEWS Score Color Yellow  Assess: if the MEWS score is Yellow or Red  Were vital signs taken at a resting state? Yes  Focused Assessment No change from prior assessment  MEWS guidelines implemented *See Row Information* No, previously yellow, continue vital signs every 4 hours  Treat  MEWS Interventions Escalated (See documentation below) (see notes)  Pain Scale 0-10  Pain Score Asleep  Notify: Provider  Provider Name/Title Dr. Bayard Beaver  Date Provider Notified 12/14/19  Time Provider Notified 1844  Notification Type Page (secure chat)  Notification Reason Other (Comment)  Response Other (Comment) (awaiting orders)   -MEWS score has been YELLOW the entire shift due to elevated HR 110's,; Primary team aware - Page Resident on duty informed, awaiting orders -Other vitals are fine, pt is asymptomatic - No further interventions at this time

## 2019-12-14 NOTE — Progress Notes (Signed)
Mr. Larmon assessed this AM; no significant findings; pt stable.  Unable to chart assessment due to emergency.

## 2019-12-14 NOTE — Progress Notes (Addendum)
Family Medicine Teaching Service Progressive Note Service Pager: [phone]  Patient name: Bill Torres Medical record number: [id number] Date of birth: 1955-11-23 Age: 64 y.o. Gender: male  Primary Care Provider: Patient, No Pcp Per Consultants: None Code Status: Full Preferred Emergency Contact: Kallie Locks (daughter) [phone]  Major Events:  8/21: admitted for DKA, placed on insulin gtt 8/22:   Assessment and Plan: Bill Torres is a 64 y.o. male who presened with generalized weakness and hyperglycemia, likely DKA. PMH is significant for obesity and no prior medical care   DKA  Newly diagnosed T2DM  Hypernatremia  Patient transitioned from insulin drip to subcutaneous insulin.  BMP this morning shows glucose of 184. CBG 180 hgb A1c 13.2, urine microalbumin elevated at 332.5. Patient continues to have nausea when he tries to drink water. Reports that he has no nausea with eating fruit as it was the only thing he could taste. Will slowly titrate insulin given patient is not attempting to eat or drink PO given loss of taste and smell. Consider giving 1/2 NS for hypernatremia with NA of 152 this AM.  - transitioned to subcutaneous insulin  - continue mIVFs - start lantus 15 units today  - sensitive sliding scale insulin  - CBG monitoring before meals and at bedtime  - daily BMP - strict I/O - cardiac monitoring, continuous pulse ox - vitals per unit routine  Generalized Weakness  Patient continues to report feeling some weakness that he reported on admission. Patient requires assistance with sitting upright in bed for pulmonary exam. Likely deconditioning from being sick with DKA for the last few days.  -PT/OT consults   Supraventricular Tachycardia  No reported medical history of tachycardia as patient has not been to physician. HR ranged form 114 to 127 overnight. Patient denies sensation of palpitations. Tachycardia considered to be due to hypotension  compensatory response, however, BP has improved on mIVFs. Review of ECG does not show signs of atrial fibrillation of flutter. BP prior to rounds was hypotensive with systolic of 80.  - cardiac monitoring  - consider starting BB if BP remains within normal limits  - continue to monitor HR   Elevated BNP   Patient with BNP of 686.3 on admission.  Patient with trace lower extremity edema.  Patient denies shortness of breath or orthopnea and does not have crackles on physical exam.  Patient does report shortness of breath with exertion on an outpatient basis.  Given patient's lack of medical care over the years, will consider if this is potential new onset heart failure.  -Follow-up echocardiogram  Anosmia  unvaccinated against Covid-19 Patient continues to report loss of smell and taste.  Patient states that he is only able to taste fruit and is requesting that this morning. -Continue to monitor -Patient would benefit from inpatient COVID-19 vaccine if amendable  ?AKI, CKD3b Elevated creatinine 1.84 on admission, improved slightly overnight with fluids to 1.3.  Microalbumin elevated at 332.5 - continue IV fluids - BMP - monitor I/Os  Hyperlipidemia: Lipid panel on admission shows cholesterol 304, triglycerides 300, HDL low at 33, VLDL elevated at 60, and LDL elevated at 211.  -Start atorvastatin 80 mg daily  FEN/GI: carb modified diet   Prophylaxis: enoxaparin  Disposition: progressive  Subjective  Patient repots feeling tired and unable to drink water without feeling nauseous. Patient states that he has only been able to taste fruit during the last few days.   Objective: Temp:  [99.2 F (37.3 C)-99.4 F (37.4 C)] 99.2  F (37.3 C) (08/22 0844) Pulse Rate:  [115-133] 116 (08/22 0844) Resp:  [15-26] 15 (08/22 0500) BP: (107-141)/(57-107) 120/87 (08/22 0844) SpO2:  [96 %-99 %] 96 % (08/22 0500) Weight:  [120.4 kg] 120.4 kg (08/22 0008)   Physical Exam: General: Obese male  lying in bed in no acute distress HEENT: Somewhat dry mucous membranes with peeling skin on lips Cardiovascular: Tachycardia, do not note murmur Respiratory: Some decreased breath sounds but no crackles and no wheezing no rhonchi Gastrointestinal: No tenderness to palpation, obese abdomen Neuro: Alert and oriented to person, place and situation  Laboratory: Recent Labs  Lab 12/13/19 0856 12/13/19 1647 12/13/19 1654  WBC 12.1* 14.4*  --   HGB 18.8* 18.6* 19.0*  HCT 59.3* 57.6* 56.0*  PLT 265 237  --    Recent Labs  Lab 12/13/19 1647 12/13/19 1647 12/13/19 1654 12/14/19 0032 12/14/19 0609  NA 148*   < > 150* 152* 152*  K 4.9   < > 4.8 4.1 4.4  CL 111  --   --  119* 116*  CO2 9*  --   --  18* 19*  BUN 33*  --   --  26* 27*  CREATININE 1.94*  --   --  1.38* 1.37*  CALCIUM 9.8  --   --  9.6 9.6  PROT 8.9*  --   --   --   --   BILITOT 2.4*  --   --   --   --   ALKPHOS 147*  --   --   --   --   ALT 47*  --   --   --   --   AST 26  --   --   --   --   GLUCOSE 482*  --   --  224* 184*   < > = values in this interval not displayed.    DG Chest 1 View  Result Date: 12/13/2019 CLINICAL DATA:  No taste or smell for 2 weeks. Generalized weakness. EXAM: CHEST  1 VIEW COMPARISON:  None. FINDINGS: Cardiomediastinal silhouette is normal. No pneumothorax. Mild opacity in left base favored to be atelectasis. No other acute abnormalities. IMPRESSION: Mild opacity in left base is favored represent atelectasis. No other acute abnormalities. Electronically Signed   By: Dorise Bullion III M.D   On: 12/13/2019 14:35    Eulis Foster, MD 12/14/2019, 10:10 AM PGY-2, [facility] Intern pager: [phone], text pages welcome

## 2019-12-15 ENCOUNTER — Inpatient Hospital Stay (HOSPITAL_COMMUNITY): Payer: Medicaid Other

## 2019-12-15 ENCOUNTER — Encounter (HOSPITAL_COMMUNITY): Payer: Self-pay | Admitting: Family Medicine

## 2019-12-15 DIAGNOSIS — E1110 Type 2 diabetes mellitus with ketoacidosis without coma: Principal | ICD-10-CM

## 2019-12-15 DIAGNOSIS — K047 Periapical abscess without sinus: Secondary | ICD-10-CM

## 2019-12-15 DIAGNOSIS — N179 Acute kidney failure, unspecified: Secondary | ICD-10-CM

## 2019-12-15 DIAGNOSIS — R430 Anosmia: Secondary | ICD-10-CM

## 2019-12-15 DIAGNOSIS — E870 Hyperosmolality and hypernatremia: Secondary | ICD-10-CM

## 2019-12-15 HISTORY — DX: Periapical abscess without sinus: K04.7

## 2019-12-15 LAB — BASIC METABOLIC PANEL
Anion gap: 14 (ref 5–15)
BUN: 16 mg/dL (ref 8–23)
CO2: 23 mmol/L (ref 22–32)
Calcium: 9.2 mg/dL (ref 8.9–10.3)
Chloride: 117 mmol/L — ABNORMAL HIGH (ref 98–111)
Creatinine, Ser: 1.38 mg/dL — ABNORMAL HIGH (ref 0.61–1.24)
GFR calc Af Amer: >60 mL/min (ref >60)
GFR calc non Af Amer: 54 mL/min — ABNORMAL LOW (ref >60)
Glucose, Bld: 371 mg/dL — ABNORMAL HIGH (ref 70–99)
Potassium: 3.9 mmol/L (ref 3.5–5.1)
Sodium: 154 mmol/L — ABNORMAL HIGH (ref 135–145)

## 2019-12-15 LAB — GLUCOSE, CAPILLARY
Glucose-Capillary: 331 mg/dL — ABNORMAL HIGH (ref 70–99)
Glucose-Capillary: 367 mg/dL — ABNORMAL HIGH (ref 70–99)
Glucose-Capillary: 321 mg/dL — ABNORMAL HIGH (ref 70–99)
Glucose-Capillary: 232 mg/dL — ABNORMAL HIGH (ref 70–99)
Glucose-Capillary: 340 mg/dL — ABNORMAL HIGH (ref 70–99)
Glucose-Capillary: 344 mg/dL — ABNORMAL HIGH (ref 70–99)
Glucose-Capillary: 367 mg/dL — ABNORMAL HIGH (ref 70–99)

## 2019-12-15 LAB — VITAMIN B12: Vitamin B-12: 809 pg/mL (ref 180–914)

## 2019-12-15 LAB — TSH: TSH: 1.459 u[IU]/mL (ref 0.350–4.500)

## 2019-12-15 MED ORDER — INSULIN GLARGINE 100 UNIT/ML ~~LOC~~ SOLN
25.0000 [IU] | Freq: Every day | SUBCUTANEOUS | Status: DC
  Administered 2019-12-16: 25 [IU] via SUBCUTANEOUS
  Filled 2019-12-15: qty 0.25

## 2019-12-15 MED ORDER — INSULIN STARTER KIT- PEN NEEDLES (ENGLISH)
1.0000 | Freq: Once | Status: AC
  Administered 2019-12-15: 1
  Filled 2019-12-15: qty 1

## 2019-12-15 MED ORDER — INSULIN GLARGINE 100 UNIT/ML ~~LOC~~ SOLN
10.0000 [IU] | Freq: Once | SUBCUTANEOUS | Status: AC
  Administered 2019-12-15: 10 [IU] via SUBCUTANEOUS
  Filled 2019-12-15: qty 0.1

## 2019-12-15 MED ORDER — ENOXAPARIN SODIUM 60 MG/0.6ML ~~LOC~~ SOLN
60.0000 mg | SUBCUTANEOUS | Status: DC
  Administered 2019-12-15 – 2019-12-18 (×4): 60 mg via SUBCUTANEOUS
  Filled 2019-12-15 (×4): qty 0.6

## 2019-12-15 MED ORDER — AMOXICILLIN 500 MG PO CAPS
500.0000 mg | ORAL_CAPSULE | Freq: Three times a day (TID) | ORAL | Status: DC
  Administered 2019-12-15 – 2019-12-23 (×24): 500 mg via ORAL
  Filled 2019-12-15 (×24): qty 1

## 2019-12-15 MED ORDER — AMOXICILLIN-POT CLAVULANATE 500-125 MG PO TABS
1.0000 | ORAL_TABLET | Freq: Three times a day (TID) | ORAL | Status: DC
  Filled 2019-12-15: qty 1

## 2019-12-15 MED ORDER — DEXTROSE 5 % IV SOLN: INTRAVENOUS | Status: DC

## 2019-12-15 NOTE — Progress Notes (Addendum)
Inpatient Diabetes Program Recommendations  AACE/ADA: New Consensus Statement on Inpatient Glycemic Control (2015)  Target Ranges:  Prepandial:   less than 140 mg/dL      Peak postprandial:   less than 180 mg/dL (1-2 hours)      Critically ill patients:  140 - 180 mg/dL   Lab Results  Component Value Date   GLUCAP 340 (H) 12/15/2019   HGBA1C 13.2 (H) 12/13/2019    Review of Glycemic Control Results for Bill Torres, Bill Torres (MRN [id number]) as of 12/15/2019 12:20  Ref. Range 12/15/2019 06:19 12/15/2019 08:02 12/15/2019 11:23  Glucose-Capillary Latest Ref Range: 70 - 99 mg/dL 367 (H) 321 (H) 340 (H)   Diabetes history: DM 2 Outpatient Diabetes medications:  None Current orders for Inpatient glycemic control:  Lantus 25 units daily Novolog sensitive q 4 hours  Inpatient Diabetes Program Recommendations:    Attempted to speak with patient regarding new diagnosis of DM.  Patient tired and eyes closed during conversation.  Explained that based on A1C and current blood sugars, patient will likely need insulin at home.  Patient verbalized understanding however he then closed his eyes.  Discussed need for patient to learn how to check blood sugars and administer insulin.  Asked patient about what he eats/drinks at home.  He complains that he has not been able to taste and therefore has been drinking lots of ginger ale and pepsi.  When I asked him about drinking more water he states, I cant taste the water and it made me throw up.  Will re-visit patient on 8/24 when he is more alert.  Discussed educational needs with RN as well.    Please consider increasing Lantus to 35 units daily as well.   Thanks,  Adah Perl, RN, BC-ADM Inpatient Diabetes Coordinator Pager [phone] (8a-5p)

## 2019-12-15 NOTE — Progress Notes (Signed)
Diabetes RN requested staff RN's to teach pt to give own insulin injections. This nurse attempted to with noon doses but pt refused, stating he was too tired.

## 2019-12-15 NOTE — Hospital Course (Addendum)
Bill Torres is a 64 y.o. male who presened with generalized weakness and hyperglycemia, likely DKA. PMH is significant for obesity and no prior medical care.   DKA  Newly diagnosed T2DM  Patient presented with tachycardia and hyperglycemia with glucose 566 on presentation.  He had an EKG done which showed tachycardia without arrhythmia.  It was suspected to be newly diagnosed diabetes and DKA, with a beta hydroxybutyrate significantly elevated at greater than 8, UA showing greater than 500 glucose, 80 ketones, 100 protein, and no evidence of infection.  He had a mild leukocytosis with WBC 12.1. He was started on insulin drip per protocol in the ED.  Patient was transitioned to subcutaneous insulin on 8/22.  Anosmia  unvaccinated against Covid-19 Patient reported several days of loss of smell, taste.  Covid test was negative.  Chest x-ray showed some possible atelectasis, but was otherwise unremarkable.  He was tachycardic, but otherwise stable and breathing comfortably on room air. Maxillofacial CT shows poor dentition with numerous absent teeth and dental caries, and periapical abscesses involving the residual left mandibular molar, mandibular incisors, and maxillary incisors. Patient treated with Amoxicillin 500mg  TID for 14 days  UTI Urine culture was positive for group B strep. Patient remained asymptomatic. Infection was treated with the Amoxicillin the patient was taking for his abscesses.    Lipoma of right groin Patient complained of lower right sided groin pain on 8/26 that was thought to be due to a hernia. A 4-5cm mass was palpated, and was confirmed to be a lipoma on soft tissue ultrasound. It did not seem to cause much pain to patient. Monitored the lipoma and patient can follow up outpatient   Generalized Weakness  Patient reported weakness on admission. He was likely deconditioned from being sick with DKA. He had PT and OT while hospitalized and both recommended SNF once  discharged to maximize function prior to going home with daughter who works. They also recommended a rolling walker.   Hyperlipidemia  Patient's lipid panel on admission showed cholesterol 304, triglycerides 300, HDL low at 33, VLDL elevated at 60, and LDL elevated at 211. He was put on atorvastatin 80 mg daily and tolerated it well.      F/U Issues  - Needs dental follow up for abscesses

## 2019-12-15 NOTE — Progress Notes (Signed)
Spoke to daughter and provided update on the patient's care. Daughter will visit the patient at beside in one hour.

## 2019-12-15 NOTE — Evaluation (Signed)
Occupational Therapy Evaluation Patient Details Name: Bill Torres MRN: [id number] DOB: Jul 16, 1955 Today's Date: 12/15/2019    History of Present Illness Bill Torres is a 64 y.o. male who presened with generalized weakness and hyperglycemia, likely DKA. pt with x2 weeks with no taste or smell with negative COVID test. PMH is significant for obesity and no prior medical care    Clinical Impression   PT admitted with DM likely DKA. Pt currently with functional limitiations due to the deficits listed below (see OT problem list). Pt lacks awareness to DM diagnoses as it relates to food choices and how he should attempt PO intakes. Pt requesting juice tasting he can not keep water down. Pt states "you dont know how bad I want just a cold cup of ice water" Pt in a very dark room and reports "I want it dark".  Pt will benefit from skilled OT to increase their independence and safety with adls and balance to allow discharge SNF (pending (A) at home as reports daughter works and he delayed coming to hospital because he didn't want to bother her).     Follow Up Recommendations  SNF    Equipment Recommendations  Other (comment) (RW)    Recommendations for Other Services       Precautions / Restrictions Precautions Precautions: Fall Restrictions Weight Bearing Restrictions: No      Mobility Bed Mobility Overal bed mobility: Needs Assistance Bed Mobility: Rolling;Supine to Sit;Sit to Supine Rolling: Min assist Sidelying to sit: Min assist   Sit to supine: Min guard   General bed mobility comments: pt requires mod (A) first attempt with mod cues. pt progressed to sitting the second attempt min (A) in all aspects. Pt requires mod cues even with improved mobility  Transfers Overall transfer level: Needs assistance Equipment used: Rolling walker (2 wheeled) Transfers: Sit to/from [facility] Sit to Stand: Min assist;From elevated surface          General transfer comment: declineda t this time    Balance Overall balance assessment: Needs assistance Sitting-balance support: No upper extremity supported;Feet supported Sitting balance-Leahy Scale: Fair     Standing balance support: Bilateral upper extremity supported;During functional activity Standing balance-Leahy Scale: Poor Standing balance comment: relies on UE support.                            ADL either performed or assessed with clinical judgement   ADL Overall ADL's : Needs assistance/impaired Eating/Feeding: Set up Eating/Feeding Details (indicate cue type and reason): reports npo due to nauseated trying     Upper Body Bathing: Minimal assistance   Lower Body Bathing: Minimal assistance                         General ADL Comments: focused on bed mobility as pt is in a dark room laying down and prefers to lay in the dark     Vision Patient Visual Report: Other (comment) Additional Comments: difficult to assess     Perception     Praxis      Pertinent Vitals/Pain Pain Assessment: No/denies pain     Hand Dominance Right   Extremity/Trunk Assessment Upper Extremity Assessment Upper Extremity Assessment: Generalized weakness   Lower Extremity Assessment Lower Extremity Assessment: Defer to PT evaluation   Cervical / Trunk Assessment Cervical / Trunk Assessment: Normal   Communication Communication Communication: No difficulties   Cognition Arousal/Alertness: Awake/alert  Behavior During Therapy: WFL for tasks assessed/performed Overall Cognitive Status: Impaired/Different from baseline Area of Impairment: Awareness                               General Comments: pt reports i think if i could get some grape fruit juice i be able to taste that its bitter. pt lacks awareness to DM related food choices   General Comments  pt reports "i would have been here sooner but i didnt want to bother my daughter. she is  just so busy"    Exercises Exercises: General Lower Extremity General Exercises - Lower Extremity Long Arc Quad: AROM;Both;10 reps;Seated   Shoulder Instructions      Home Living Family/patient expects to be discharged to:: Private residence Living Arrangements: Children Available Help at Discharge: Family;Available PRN/intermittently Type of Home: House Home Access: Level entry     Home Layout: Able to live on main level with bedroom/bathroom;1/2 bath on main level;Two level Alternate Level Stairs-Number of Steps: flight   Bathroom Shower/Tub: Teacher, early years/pre: Standard     Home Equipment: None   Additional Comments: reports daughter works as a Transport planner in the homes. pt states "she takes care of people in their homes"      Prior Functioning/Environment Level of Independence: Independent        Comments: Driving, retired, pt cooks some        OT Problem List: Impaired balance (sitting and/or standing);Decreased activity tolerance;Decreased knowledge of use of DME or AE;Decreased knowledge of precautions;Decreased safety awareness;Decreased cognition;Obesity      OT Treatment/Interventions: Self-care/ADL training;Therapeutic exercise;Energy conservation;DME and/or AE instruction;Therapeutic activities;Cognitive remediation/compensation;Patient/family education;Balance training    OT Goals(Current goals can be found in the care plan section) Acute Rehab OT Goals Patient Stated Goal: to be able to get some grape fruit so i can taste OT Goal Formulation: With patient Time For Goal Achievement: 12/29/19 Potential to Achieve Goals: Good  OT Frequency: Min 2X/week   Barriers to D/C:            Co-evaluation              AM-PAC OT "6 Clicks" Daily Activity     Outcome Measure Help from another person eating meals?: A Little Help from another person taking care of personal grooming?: A Little Help from another person toileting, which  includes using toliet, bedpan, or urinal?: A Little Help from another person bathing (including washing, rinsing, drying)?: A Little Help from another person to put on and taking off regular upper body clothing?: A Little Help from another person to put on and taking off regular lower body clothing?: A Little 6 Click Score: 18   End of Session Nurse Communication: Mobility status;Precautions  Activity Tolerance: Patient tolerated treatment well Patient left: in bed;with call bell/phone within reach;with bed alarm set  OT Visit Diagnosis: Unsteadiness on feet (R26.81);Muscle weakness (generalized) (M62.81)                Time: 0712-1975 OT Time Calculation (min): 17 min Charges:  OT General Charges $OT Visit: 1 Visit OT Evaluation $OT Eval Moderate Complexity: 1 Mod   Bill Torres, OTR/L  Acute Rehabilitation Services Pager: [phone] Office: [phone] .   Bill Torres 12/15/2019, 2:11 PM

## 2019-12-15 NOTE — Progress Notes (Addendum)
Family Medicine Teaching Service Daily Progress Note Intern Pager: [phone]  Patient name: Hyun Marsalis Medical record number: [id number] Date of birth: 1955-11-18 Age: 64 y.o. Gender: male  Primary Care Provider: Patient, No Pcp Per Consultants: None Code Status: Full  Major Events:  8/21: admitted for DKA, placed on insulin gtt 8/22: Transitioned to subcutaneous insulin  Assessment and Plan: Sonia Stickels is a 64 y.o. male who presened with generalized weakness and hyperglycemia, likely DKA. PMH is significant for obesity and no prior medical care.   DKA  Newly diagnosed T2DM Patient transitioned from insulin drip to subcutaneous insulin on 8/22.  BMP this morning shows glucose of 388. CBG 367. On admission, hgb A1c 13.2, urine microalbumin elevated at 332.5.  - continue mIVFs - Increased Lantus from 15 to 25 units daily  - sensitive sliding scale insulin  - CBG monitoring before meals and at bedtime  - daily BMP - strict I/O - cardiac monitoring, continuous pulse ox - vitals per unit routine  Hypernatremia Na+ currently 154.  Free water deficit calculated to be 7.3 L - Switched from D5 1/2 NS to D5 for 18 hours and will reassess   Anosmia and Dysgeusia  unvaccinated against Covid-19 Patient continues to report loss of smell and taste. Maxillofacial CT shows poor dentition with numerous absent teeth and dental caries, and periapical abscesses involving the residual left mandibular molar, mandibular incisors, and maxillary incisors.  -Continue to monitor -Patient would benefit from inpatient COVID-19 vaccine if amendable - amoxicillin 500mg  TID 14 days  - Consult Dr. Enrique Sack, appreciate recommendations   Generalized Weakness  Patient reported weakness on admission. Patient requires assistance with sitting upright in bed for pulmonary exam. Likely deconditioning from being sick with DKA for the last few days.  -PT/OT consults   Supraventricular  Tachycardia  No reported medical history of tachycardia as patient has not been to physician. HR ranged form 107 to 120 overnight. Patient denies sensation of palpitations. Tachycardia considered to be due to hypotension compensatory response, however, BP has improved on mIVFs. Review of ECG does not show signs of atrial fibrillation or flutter. BP prior to rounds was hypotensive with systolic of 80.  - cardiac monitoring  - consider starting BB if BP remains within normal limits  - continue to monitor HR   Elevated BNP   Patient with BNP of 686.3 on admission.  Patient with trace lower extremity edema.  Patient denies shortness of breath or orthopnea and does not have crackles on physical exam. Patient does report shortness of breath with exertion on an outpatient basis. Given patient's lack of medical care over the years, will consider if this is potential new onset heart failure.  -Follow-up echocardiogram  ?AKI, CKD3b Elevated creatinine 1.84 on admission, currently 1.43.  Microalbumin elevated at 332.5 - continue IV fluids - BMP - monitor I/Os  Hyperlipidemia  Lipid panel on admission shows cholesterol 304, triglycerides 300, HDL low at 33, VLDL elevated at 60, and LDL elevated at 211.  -Start atorvastatin 80 mg daily  FEN/GI: carb modified diet   Prophylaxis: enoxaparin  Disposition: progressive   Subjective:   No acute events overnight. Patient endorsed nausea but denied vomiting this morning stating he was able to keep his breakfast down. He still does not have a sense of smell or taste.   Objective: Temp:  [98.2 F (36.8 C)-99.3 F (37.4 C)] 98.5 F (36.9 C) (08/23 0500) Pulse Rate:  [104-120] 104 (08/23 0500) Resp:  [17-24] 19 (08/23  0500) BP: (108-144)/(58-87) 122/67 (08/23 0500) SpO2:  [93 %-98 %] 96 % (08/23 0500) Weight:  [122.3 kg] 122.3 kg (08/23 0100) Physical Exam: General: alert, in no acute distress  Cardiovascular: RRR. No murmur appreciated   Respiratory: CTA Abdomen: soft, non-tender obese  Extremities: warm, dry. Distal pulses 2+  Laboratory: Recent Labs  Lab 12/13/19 0856 12/13/19 1647 12/13/19 1654  WBC 12.1* 14.4*  --   HGB 18.8* 18.6* 19.0*  HCT 59.3* 57.6* 56.0*  PLT 265 237  --    Recent Labs  Lab 12/13/19 1647 12/13/19 1654 12/14/19 0032 12/14/19 0609 12/14/19 2144  NA 148*   < > 152* 152* 151*  K 4.9   < > 4.1 4.4 4.0  CL 111   < > 119* 116* 118*  CO2 9*   < > 18* 19* 18*  BUN 33*   < > 26* 27* 20  CREATININE 1.94*   < > 1.38* 1.37* 1.43*  CALCIUM 9.8   < > 9.6 9.6 8.9  PROT 8.9*  --   --   --   --   BILITOT 2.4*  --   --   --   --   ALKPHOS 147*  --   --   --   --   ALT 47*  --   --   --   --   AST 26  --   --   --   --   GLUCOSE 482*   < > 224* 184* 388*   < > = values in this interval not displayed.    Imaging/Diagnostic Tests:  DG Chest 1 View  Mild opacity in left base is favored represent atelectasis. No other acute abnormalities.  DG Abd 1 View  No cause for bombing identified.  No bowel obstruction.   US RENAL No cause for hematuria identified.  No acute abnormalities.   ECHOCARDIOGRAM COMPLETE 1. Left ventricular ejection fraction, by estimation, is 70 to 75%. The left ventricle has hyperdynamic function. The left ventricle has no regional wall motion abnormalities. There is mild concentric left ventricular hypertrophy. Left ventricular diastolic parameters are consistent with Grade I diastolic dysfunction (impaired relaxation).  2. Right ventricular systolic function is normal. The right ventricular size is mildly enlarged.  3. The mitral valve is normal in structure. No evidence of mitral valve regurgitation. No evidence of mitral stenosis.  4. The aortic valve is normal in structure. Aortic valve regurgitation is not visualized. No aortic stenosis is present.  5. The inferior vena cava is normal in size with greater than 50% respiratory variability, suggesting right atrial pressure  of 3 mmHg. FINDINGS  Left Ventricle: Left ventricular ejection fraction, by estimation, is 70 to 75%. The left ventricle has hyperdynamic function. The left ventricle has no regional wall motion abnormalities. Definity contrast agent was given IV to delineate the left ventricular endocardial borders. The left ventricular internal cavity size was normal in size. There is mild concentric left ventricular hypertrophy. Left ventricular diastolic parameters are consistent with Grade I diastolic dysfunction (impaired relaxation). Normal left ventricular filling pressure. Right Ventricle: The right ventricular size is mildly enlarged. No increase in right ventricular wall thickness. Right ventricular systolic function is normal. Left Atrium: Left atrial size was normal in size. Right Atrium: Right atrial size was normal in size. Pericardium: There is no evidence of pericardial effusion. Mitral Valve: The mitral valve is normal in structure. Normal mobility of the mitral valve leaflets. No evidence of mitral valve regurgitation. No evidence  of mitral valve stenosis. Tricuspid Valve: The tricuspid valve is normal in structure. Tricuspid valve regurgitation is not demonstrated. No evidence of tricuspid stenosis. Aortic Valve: The aortic valve is normal in structure. Aortic valve regurgitation is not visualized. No aortic stenosis is present. Pulmonic Valve: The pulmonic valve was normal in structure. Pulmonic valve regurgitation is not visualized. No evidence of pulmonic stenosis. Aorta: The aortic root is normal in size and structure. Venous: The inferior vena cava is normal in size with greater than 50% respiratory variability, suggesting right atrial pressure of 3 mmHg. IAS/Shunts: No atrial level shunt detected by color flow Doppler.   CT MAXILLOFACIAL WO CONTRAST Poor dentition with numerous absent teeth and dental caries. Additionally, periapical abscesses are seen involving the residual left mandibular molar,  mandibular incisors, and maxillary incisors.    Shary Key, DO 12/15/2019, 5:28 AM PGY-1, [place] Intern pager: [phone], text pages welcome

## 2019-12-15 NOTE — Progress Notes (Signed)
Brief Nutrition Education Note  RD consulted for nutrition education regarding diabetes.   Attempted to speak with pt x 2. At initial visit, pt very sleepy and did not respond to touch or name being called. RD also attempted to speak with pt via phone call to hospital room, however, no answer.   Lab Results  Component Value Date   HGBA1C 13.2 (H) 12/13/2019   PTA DM medications are none.   Labs reviewed: CBGS: 321 (inpatient orders for glycemic control are 0-9 units insulin aspart every 4 hours and 25 units insulin glargine daily).    RD provided "Carbohydrate Counting for People with Diabetes" handout from the Academy of Nutrition and Dietetics.   Current diet order is carb modified, patient is consuming approximately 100% of meals at this time. Labs and medications reviewed. No further nutrition interventions warranted at this time. RD contact information provided. If additional nutrition issues arise, please re-consult RD.  Loistine Chance, RD, LDN, [place] Registered Dietitian II Certified Diabetes Care and Education Specialist Please refer to [facility] for RD and/or RD on-call/weekend/after hours pager

## 2019-12-15 NOTE — Discharge Instructions (Addendum)
Dear Bill Torres,   Thank you so much for allowing [place] to be part of your care!  You were admitted to [facility] for diabetic ketoacidosis due to underlying diabetes. You were treated with insulin through the IV and transitioned to insulin injections. We will be discharging you with subcutaneous insulin injections to control your blood sugar.     POST-HOSPITAL & CARE INSTRUCTIONS 1. Please schedule appointment to see PCP within the next week 2. Please let PCP/Specialists know of any changes that were made.  3. Please see medications section of this packet for any medication changes.   DOCTOR'S APPOINTMENT & FOLLOW UP CARE INSTRUCTIONS  Future Appointments  Date Time Provider [place]  01/07/2020 10:50 AM Argentina Donovan, PA-C [id number] None    RETURN PRECAUTIONS: Return if you develop increased weakness, lightheaded, difficulty breathing, increased nausea and vomiting, or other new and concerning symptoms.   Take care and be well!  [facility]  [place], [place] [id number] [phone]    Carbohydrate Counting For People With Diabetes  Foods with carbohydrates make your blood glucose level go up. Learning how to count carbohydrates can help you control your blood glucose levels. First, identify the foods you eat that contain carbohydrates. Then, using the Foods with Carbohydrates chart, determine about how much carbohydrates are in your meals and snacks. Make sure you are eating foods with fiber, protein, and healthy fat along with your carbohydrate foods. Foods with Carbohydrates The following table shows carbohydrate foods that have about 15 grams of carbohydrate each. Using measuring cups, spoons, or a food scale when you first begin learning about carbohydrate counting can help you learn about the portion sizes you typically eat. The following foods have 15 grams  carbohydrate each:  Grains . 1 slice bread (1 ounce)  . 1 small tortilla (6-inch size)  .  large bagel (1 ounce)  . 1/3 cup pasta or rice (cooked)  .  hamburger or hot dog bun ( ounce)  .  cup cooked cereal  .  to  cup ready-to-eat cereal  . 2 taco shells (5-inch size) Fruit . 1 small fresh fruit ( to 1 cup)  .  medium banana  . 17 small grapes (3 ounces)  . 1 cup melon or berries  .  cup canned or frozen fruit  . 2 tablespoons dried fruit (blueberries, cherries, cranberries, raisins)  .  cup unsweetened fruit juice  Starchy Vegetables .  cup cooked beans, peas, corn, potatoes/sweet potatoes  .  large baked potato (3 ounces)  . 1 cup acorn or butternut squash  Snack Foods . 3 to 6 crackers  . 8 potato chips or 13 tortilla chips ( ounce to 1 ounce)  . 3 cups popped popcorn  Dairy . 3/4 cup (6 ounces) nonfat plain yogurt, or yogurt with sugar-free sweetener  . 1 cup milk  . 1 cup plain rice, soy, coconut or flavored almond milk Sweets and Desserts .  cup ice cream or frozen yogurt  . 1 tablespoon jam, jelly, pancake syrup, table sugar, or honey  . 2 tablespoons light pancake syrup  . 1 inch square of frosted cake or 2 inch square of unfrosted cake  . 2 small cookies (2/3 ounce each) or  large cookie  Sometimes you'll have to estimate carbohydrate amounts if you don't know the exact recipe. One cup of mixed foods like soups can  have 1 to 2 carbohydrate servings, while some casseroles might have 2 or more servings of carbohydrate. Foods that have less than 20 calories in each serving can be counted as "free" foods. Count 1 cup raw vegetables, or  cup cooked non-starchy vegetables as "free" foods. If you eat 3 or more servings at one meal, then count them as 1 carbohydrate serving.  Foods without Carbohydrates  Not all foods contain carbohydrates. Meat, some dairy, fats, non-starchy vegetables, and many beverages don't contain carbohydrate. So when you count  carbohydrates, you can generally exclude chicken, pork, beef, fish, seafood, eggs, tofu, cheese, butter, sour cream, avocado, nuts, seeds, olives, mayonnaise, water, black coffee, unsweetened tea, and zero-calorie drinks. Vegetables with no or low carbohydrate include green beans, cauliflower, tomatoes, and onions. How much carbohydrate should I eat at each meal?  Carbohydrate counting can help you plan your meals and manage your weight. Following are some starting points for carbohydrate intake at each meal. Work with your registered dietitian nutritionist to find the best range that works for your blood glucose and weight.   To Lose Weight To Maintain Weight  Women 2 - 3 carb servings 3 - 4 carb servings  Men 3 - 4 carb servings 4 - 5 carb servings  Checking your blood glucose after meals will help you know if you need to adjust the timing, type, or number of carbohydrate servings in your meal plan. Achieve and keep a healthy body weight by balancing your food intake and physical activity.  Tips How should I plan my meals?  Plan for half the food on your plate to include non-starchy vegetables, like salad greens, broccoli, or carrots. Try to eat 3 to 5 servings of non-starchy vegetables every day. Have a protein food at each meal. Protein foods include chicken, fish, meat, eggs, or beans (note that beans contain carbohydrate). These two food groups (non-starchy vegetables and proteins) are low in carbohydrate. If you fill up your plate with these foods, you will eat less carbohydrate but still fill up your stomach. Try to limit your carbohydrate portion to  of the plate.  What fats are healthiest to eat?  Diabetes increases risk for heart disease. To help protect your heart, eat more healthy fats, such as olive oil, nuts, and avocado. Eat less saturated fats like butter, cream, and high-fat meats, like bacon and sausage. Avoid trans fats, which are in all foods that list "partially hydrogenated oil"  as an ingredient. What should I drink?  Choose drinks that are not sweetened with sugar. The healthiest choices are water, carbonated or seltzer waters, and tea and coffee without added sugars.  Sweet drinks will make your blood glucose go up very quickly. One serving of soda or energy drink is  cup. It is best to drink these beverages only if your blood glucose is low.  Artificially sweetened, or diet drinks, typically do not increase your blood glucose if they have zero calories in them. Read labels of beverages, as some diet drinks do have carbohydrate and will raise your blood glucose. Label Reading Tips Read Nutrition Facts labels to find out how many grams of carbohydrate are in a food you want to eat. Don't forget: sometimes serving sizes on the label aren't the same as how much food you are going to eat, so you may need to calculate how much carbohydrate is in the food you are serving yourself.   Carbohydrate Counting for People with Diabetes Sample 1-Day Menu  Breakfast  cup yogurt, low fat, low sugar (1 carbohydrate serving)   cup cereal, ready-to-eat, unsweetened (1 carbohydrate serving)  1 cup strawberries (1 carbohydrate serving)   cup almonds ( carbohydrate serving)  Lunch 1, 5 ounce can chunk light tuna  2 ounces cheese, low fat cheddar  6 whole wheat crackers (1 carbohydrate serving)  1 small apple (1 carbohydrate servings)   cup carrots ( carbohydrate serving)   cup snap peas  1 cup 1% milk (1 carbohydrate serving)   Evening Meal Stir fry made with: 3 ounces chicken  1 cup brown rice (3 carbohydrate servings)   cup broccoli ( carbohydrate serving)   cup green beans   cup onions  1 tablespoon olive oil  2 tablespoons teriyaki sauce ( carbohydrate serving)  Evening Snack 1 extra small banana (1 carbohydrate serving)  1 tablespoon peanut butter   Carbohydrate Counting for People with Diabetes Vegan Sample 1-Day Menu  Breakfast 1 cup cooked oatmeal (2  carbohydrate servings)   cup blueberries (1 carbohydrate serving)  2 tablespoons flaxseeds  1 cup soymilk fortified with calcium and vitamin D  1 cup coffee  Lunch 2 slices whole wheat bread (2 carbohydrate servings)   cup baked tofu   cup lettuce  2 slices tomato  2 slices avocado   cup baby carrots ( carbohydrate serving)  1 orange (1 carbohydrate serving)  1 cup soymilk fortified with calcium and vitamin D   Evening Meal Burrito made with: 1 6-inch corn tortilla (1 carbohydrate serving)  1 cup refried vegetarian beans (2 carbohydrate servings)   cup chopped tomatoes   cup lettuce   cup salsa  1/3 cup brown rice (1 carbohydrate serving)  1 tablespoon olive oil for rice   cup zucchini   Evening Snack 6 small whole grain crackers (1 carbohydrate serving)  2 apricots ( carbohydrate serving)   cup unsalted peanuts ( carbohydrate serving)    Carbohydrate Counting for People with Diabetes Vegetarian (Lacto-Ovo) Sample 1-Day Menu  Breakfast 1 cup cooked oatmeal (2 carbohydrate servings)   cup blueberries (1 carbohydrate serving)  2 tablespoons flaxseeds  1 egg  1 cup 1% milk (1 carbohydrate serving)  1 cup coffee  Lunch 2 slices whole wheat bread (2 carbohydrate servings)  2 ounces low-fat cheese   cup lettuce  2 slices tomato  2 slices avocado   cup baby carrots ( carbohydrate serving)  1 orange (1 carbohydrate serving)  1 cup unsweetened tea  Evening Meal Burrito made with: 1 6-inch corn tortilla (1 carbohydrate serving)   cup refried vegetarian beans (1 carbohydrate serving)   cup tomatoes   cup lettuce   cup salsa  1/3 cup brown rice (1 carbohydrate serving)  1 tablespoon olive oil for rice   cup zucchini  1 cup 1% milk (1 carbohydrate serving)  Evening Snack 6 small whole grain crackers (1 carbohydrate serving)  2 apricots ( carbohydrate serving)   cup unsalted peanuts ( carbohydrate serving)    Copyright 2020  Academy of Nutrition and  Dietetics. All rights reserved.  Using Nutrition Labels: Carbohydrate  . Serving Size  . Look at the serving size. All the information on the label is based on this portion. Randol Kern Per Container  . The number of servings contained in the package. . Guidelines for Carbohydrate  . Look at the total grams of carbohydrate in the serving size.  . 1 carbohydrate choice = 15 grams of carbohydrate. Range of Carbohydrate Grams Per Choice  Carbohydrate Grams/Choice Carbohydrate Choices  6-10   11-20 1  21-25 1  26-35 2  36-40 2  41-50 3  51-55 3  56-65 4  66-70 4  71-80 5    Copyright 2020  Academy of Nutrition and Dietetics. All rights reserved.

## 2019-12-15 NOTE — Evaluation (Signed)
Physical Therapy Evaluation Patient Details Name: Bill Torres MRN: [id number] DOB: [birth year] Today's Date: 12/15/2019   History of Present Illness  (P) Bill Torres is a 64 y.o. male who presened with generalized weakness and hyperglycemia, likely DKA. pt with x2 weeks with no taste or smell with negative COVID test. PMH is significant for obesity and no prior medical care   Clinical Impression  Pt admitted with above diagnosis. Pt was able to stand with min  assist and cues from significantly raised bed.  Pt took a few steps to [facility].  Limited per pt as he was fatigued.  Of note, pts bed was wet with urine and pt unaware. Changed all linens. May need SNF to maximize function prior to d/c home as his daughter works.   Pt currently with functional limitations due to the deficits listed below (see PT Problem List). Pt will benefit from skilled PT to increase their independence and safety with mobility to allow discharge to the venue listed below.      Follow Up Recommendations SNF;Supervision/Assistance - 24 hour (Depends on progress, daughter works per pt)    Clinical biochemist with 5" wheels;3in1 (PT)    Recommendations for Other Services       Precautions / Restrictions Precautions Precautions: (P) Fall Restrictions Weight Bearing Restrictions: No      Mobility  Bed Mobility Overal bed mobility: Needs Assistance Bed Mobility: Rolling;Sidelying to Sit Rolling: Mod assist Sidelying to sit: Mod assist       General bed mobility comments: Pt needed to reach to threapist and pull up on PT hand to come to EOB.  Took incr time and effort  Transfers Overall transfer level: Needs assistance Equipment used: Rolling walker (2 wheeled) Transfers: Sit to/from [facility] Sit to Stand: Min assist;From elevated surface         General transfer comment: Pt stood from bed with bed elevated quite a bit as pt is 6 feet 1 inch tall.  Pt needed assist to power up but was steady once up with RW.  Took a few steps to [facility] with RW. Changed linens as they were wet with sweat or urine.    Ambulation/Gait Ambulation/Gait assistance: Min guard;Min assist Gait Distance (Feet): 3 Feet Assistive device: Rolling walker (2 wheeled) Gait Pattern/deviations: Step-through pattern;Decreased stride length   Gait velocity interpretation: <1.31 ft/sec, indicative of household ambulator General Gait Details: Pt steady with RW but limited distance today per pt request  Stairs            Wheelchair Mobility    Modified Rankin (Stroke Patients Only)       Balance Overall balance assessment: Needs assistance Sitting-balance support: No upper extremity supported;Feet supported Sitting balance-Leahy Scale: Fair     Standing balance support: Bilateral upper extremity supported;During functional activity Standing balance-Leahy Scale: Poor Standing balance comment: relies on UE support.                              Pertinent Vitals/Pain      Home Living Family/patient expects to be discharged to:: (P) Private residence Living Arrangements: (P) Children Available Help at Discharge: (P) Family;Available PRN/intermittently Type of Home: (P) House Home Access: (P) Level entry     Home Layout: (P) Able to live on main level with bedroom/bathroom;1/2 bath on main level;Two level        Prior Function  Hand Dominance        Extremity/Trunk Assessment   Upper Extremity Assessment Upper Extremity Assessment: Defer to OT evaluation    Lower Extremity Assessment Lower Extremity Assessment: Generalized weakness    Cervical / Trunk Assessment Cervical / Trunk Assessment: Normal  Communication      Cognition                                              General Comments      Exercises General Exercises - Lower Extremity Long Arc Quad: AROM;Both;10 reps;Seated    Assessment/Plan    PT Assessment Patient needs continued PT services  PT Problem List Decreased balance;Decreased activity tolerance;Decreased mobility;Decreased knowledge of use of DME;Decreased safety awareness;Decreased knowledge of precautions       PT Treatment Interventions DME instruction;Gait training;Functional mobility training;Therapeutic activities;Therapeutic exercise;Balance training;Patient/family education;Stair training    PT Goals (Current goals can be found in the Care Plan section)  Acute Rehab PT Goals Patient Stated Goal: to go home PT Goal Formulation: With patient Time For Goal Achievement: 12/29/19 Potential to Achieve Goals: Good    Frequency Min 3X/week   Barriers to discharge Decreased caregiver support      Co-evaluation               AM-PAC PT "6 Clicks" Mobility  Outcome Measure Help needed turning from your back to your side while in a flat bed without using bedrails?: A Little Help needed moving from lying on your back to sitting on the side of a flat bed without using bedrails?: A Lot Help needed moving to and from a bed to a chair (including a wheelchair)?: A Lot Help needed standing up from a chair using your arms (e.g., wheelchair or bedside chair)?: A Lot Help needed to walk in hospital room?: A Little Help needed climbing 3-5 steps with a railing? : A Lot 6 Click Score: 14    End of Session Equipment Utilized During Treatment: Gait belt Activity Tolerance: Patient limited by fatigue Patient left: in bed;with call bell/phone within reach;with bed alarm set Nurse Communication: Mobility status PT Visit Diagnosis: Muscle weakness (generalized) (M62.81)    Time: 9702-6378 PT Time Calculation (min) (ACUTE ONLY): 27 min   Charges:   PT Evaluation $PT Eval Moderate Complexity: 1 Mod PT Treatments $Gait Training: 8-22 mins        Bill Torres W,PT Acute Rehabilitation Services Pager:  [phone]  Office:  [phone]     Denice Paradise 12/15/2019, 2:02 PM

## 2019-12-15 NOTE — Plan of Care (Signed)
  Problem: Clinical Measurements: Goal: Ability to maintain clinical measurements within normal limits will improve Outcome: Progressing   

## 2019-12-16 DIAGNOSIS — K047 Periapical abscess without sinus: Secondary | ICD-10-CM

## 2019-12-16 DIAGNOSIS — R432 Parageusia: Secondary | ICD-10-CM

## 2019-12-16 LAB — BASIC METABOLIC PANEL
Anion gap: 13 (ref 5–15)
BUN: 10 mg/dL (ref 8–23)
CO2: 26 mmol/L (ref 22–32)
Calcium: 9.1 mg/dL (ref 8.9–10.3)
Chloride: 109 mmol/L (ref 98–111)
Creatinine, Ser: 1.16 mg/dL (ref 0.61–1.24)
GFR calc Af Amer: >60 mL/min (ref >60)
GFR calc non Af Amer: >60 mL/min (ref >60)
Glucose, Bld: 341 mg/dL — ABNORMAL HIGH (ref 70–99)
Potassium: 3.7 mmol/L (ref 3.5–5.1)
Sodium: 148 mmol/L — ABNORMAL HIGH (ref 135–145)

## 2019-12-16 LAB — GLUCOSE, CAPILLARY
Glucose-Capillary: 386 mg/dL — ABNORMAL HIGH (ref 70–99)
Glucose-Capillary: 310 mg/dL — ABNORMAL HIGH (ref 70–99)
Glucose-Capillary: 324 mg/dL — ABNORMAL HIGH (ref 70–99)
Glucose-Capillary: 333 mg/dL — ABNORMAL HIGH (ref 70–99)
Glucose-Capillary: 356 mg/dL — ABNORMAL HIGH (ref 70–99)
Glucose-Capillary: 350 mg/dL — ABNORMAL HIGH (ref 70–99)
Glucose-Capillary: 332 mg/dL — ABNORMAL HIGH (ref 70–99)

## 2019-12-16 LAB — HCV INTERPRETATION

## 2019-12-16 LAB — HCV AB W REFLEX TO QUANT PCR: HCV Ab: <0.1 s/co ratio (ref 0.0–0.9)

## 2019-12-16 MED ORDER — INSULIN GLARGINE 100 UNIT/ML ~~LOC~~ SOLN
10.0000 [IU] | Freq: Once | SUBCUTANEOUS | Status: AC
  Administered 2019-12-16: 10 [IU] via SUBCUTANEOUS
  Filled 2019-12-16: qty 0.1

## 2019-12-16 MED ORDER — DEXTROSE 5 % IV SOLN: INTRAVENOUS | Status: DC

## 2019-12-16 MED ORDER — INSULIN ASPART 100 UNIT/ML ~~LOC~~ SOLN
5.0000 [IU] | Freq: Three times a day (TID) | SUBCUTANEOUS | Status: DC
  Administered 2019-12-16: 5 [IU] via SUBCUTANEOUS

## 2019-12-16 MED ORDER — INSULIN GLARGINE 100 UNIT/ML ~~LOC~~ SOLN
40.0000 [IU] | Freq: Every day | SUBCUTANEOUS | Status: DC
  Administered 2019-12-17: 40 [IU] via SUBCUTANEOUS
  Filled 2019-12-16: qty 0.4

## 2019-12-16 MED ORDER — INSULIN ASPART 100 UNIT/ML ~~LOC~~ SOLN
0.0000 [IU] | Freq: Once | SUBCUTANEOUS | Status: AC
  Administered 2019-12-16: 7 [IU] via SUBCUTANEOUS

## 2019-12-16 MED ORDER — DEXTROSE 5 % IV SOLN: INTRAVENOUS | Status: AC

## 2019-12-16 MED ORDER — INSULIN ASPART 100 UNIT/ML ~~LOC~~ SOLN
0.0000 [IU] | SUBCUTANEOUS | Status: DC
  Administered 2019-12-17 (×3): 5 [IU] via SUBCUTANEOUS
  Administered 2019-12-17 (×2): 7 [IU] via SUBCUTANEOUS
  Administered 2019-12-17: 9 [IU] via SUBCUTANEOUS
  Administered 2019-12-18: 5 [IU] via SUBCUTANEOUS
  Administered 2019-12-18: 7 [IU] via SUBCUTANEOUS
  Administered 2019-12-18: 3 [IU] via SUBCUTANEOUS
  Administered 2019-12-18 – 2019-12-19 (×5): 5 [IU] via SUBCUTANEOUS

## 2019-12-16 MED ORDER — INSULIN GLARGINE 100 UNIT/ML ~~LOC~~ SOLN: 35.0000 [IU] | Freq: Every day | SUBCUTANEOUS | Status: DC

## 2019-12-16 MED ORDER — INSULIN GLARGINE 100 UNIT/ML ~~LOC~~ SOLN: 45.0000 [IU] | Freq: Every day | SUBCUTANEOUS | Status: DC

## 2019-12-16 NOTE — Progress Notes (Signed)
Brief Follow-Up Education Note  RD working remotely.  RD attempted to follow-up with pt regarding diabetes education. Please see progress note on 12/15/19 regarding efforts to educate pt. RD attempted to speak with pt via call to hospital room phone, however, call was terminated prior to RD having the opportunity to speak with pt.   Handouts have been provided to AVS. Plan for pt to follow-up with [facility] for PCP and medication assistance.   RD will re-attempt to educate as time allows.   Loistine Chance, RD, LDN, [place] Registered Dietitian II Certified Diabetes Care and Education Specialist Please refer to [facility] for RD and/or RD on-call/weekend/after hours pager

## 2019-12-16 NOTE — Progress Notes (Signed)
Inpatient Diabetes Program Recommendations  AACE/ADA: New Consensus Statement on Inpatient Glycemic Control (2015)  Target Ranges:  Prepandial:   less than 140 mg/dL      Peak postprandial:   less than 180 mg/dL (1-2 hours)      Critically ill patients:  140 - 180 mg/dL   Lab Results  Component Value Date   GLUCAP 333 (H) 12/16/2019   HGBA1C 13.2 (H) 12/13/2019    Review of Glycemic Control Results for YADER, CRIGER (MRN [id number]) as of 12/16/2019 10:39  Ref. Range 12/15/2019 22:05 12/16/2019 02:33 12/16/2019 06:43 12/16/2019 07:26 12/16/2019 10:09  Glucose-Capillary Latest Ref Range: 70 - 99 mg/dL 367 (H) 386 (H) 310 (H) 324 (H) 333 (H)  Diabetes history: DM 2 Outpatient Diabetes medications:  None Current orders for Inpatient glycemic control:  Lantus 25 units daily Novolog sensitive q 4 hours  Inpatient Diabetes Program Recommendations:    Please increase Lantus to 40 units daily and add Novolog 4 units tid with meals. Will see patient again today.   Thanks,  Adah Perl, RN, BC-ADM Inpatient Diabetes Coordinator Pager [phone]   1420:  Spoke with patient at length regarding DM diagnosis.  Patient has never seen a doctor before and was tearful.  It is apparent that he is very overwhelmed with new diagnosis.  He states that he lost his job due to [place] and has had to move in with his daughter.  He has been weak for a while but states "I didn't want to bother anyone".  I asked if he was feeling sad and he states "yes".  Spoke with pt about new diagnosis.  Discussed A1C results with him and explained what an A1C is, basic pathophysiology of DM Type 2, basic home care, importance of checking CBGs and maintaining good CBG control to prevent long-term and short-term complications.  Reviewed signs and symptoms of hyperglycemia and hypoglycemia.  RNs to provide ongoing basic DM education at bedside with this patient.  He has given an injection to himself.  Will give him  Reli-On meter for home due to lack of insurance.  Called and spoke with daughter as well.  She has used insulin pen in the past, however wanted refresher. Sent insulin pen video to her. She is a CNA and knows how to check CBG's as well.  She states that her dad is very scared too and said things such as "If I live" and "If I'm still here".    Called and reported to resident.  Patient will likely not do well with 4 shot a day regimen.  Consider metformin plus basal insulin at d/c.  Hopefully he can get insulin from [facility].  He will need close f/u and lots of support.

## 2019-12-16 NOTE — Progress Notes (Signed)
Family Medicine Teaching Service Daily Progress Note Intern Pager: [phone]  Patient name: Bill Torres Medical record number: [id number] Date of birth: 1955/07/15 Age: 64 y.o. Gender: male  Primary Care Provider: Patient, No Pcp Per Consultants: None Code Status: Full  Major Events:  8/21: admitted for DKA, placed on insulin gtt 8/22:Transitioned to subcutaneous insulin  Assessment and Plan: Bill Torres is a 64 y.o. male who presented with generalized weakness and hyperglycemia, likely DKA. PMH is significant for obesity and no prior medical care. Patient is medically stable for discharge to SNF.  DKA  Newly diagnosed T2DM Patient transitioned from insulin drip to subcutaneous insulin on 8/22. On admission, hgb A1c13.2, urine microalbuminelevated at 332.5.CBG this am is 324 - continue mIVFs - Increasing Lantus to 40 units daily  - sensitive sliding scale insulin - CBGmonitoring before meals and at bedtime -daily BMP - strict I/O - cardiac monitoring, continuous pulse ox - vitals per unit routine  Hypernatremia Na+ currently 148.   - D5 until tomorrow and will reassess   Anosmia and Dysgeusia  unvaccinated against Covid-19 Patient continues to report loss of smell and taste. Maxillofacial CT shows poor dentition with numerous absent teeth and dental caries, and periapical abscesses involving the residual left mandibular molar, mandibular incisors, and maxillary incisors.  - Continue to monitor - Patient to receivet COVID-19 vaccine tomorrow, 8/25 - amoxicillin 500mg  TID 14 days  - Consult Dr. Enrique Sack, appreciate recommendations   Generalized Weakness  Patient reported weakness on admission. Patient requires assistance with sitting upright in bed for pulmonary exam. Likely deconditioning from being sick with DKA for the last few days.  -PT/OT consults: PT and OT both recommend SNF on d/c to maximize function prior to going home with daughter  who works. Recommends rolling walker   Supraventricular Tachycardia  No reported medical history of tachycardia as patient has not been to physician. Patient denies sensation of palpitations. Tachycardia considered to be due to hypotension compensatory response, however, BP has improved on mIVFs. Review of ECG does not show signs of atrial fibrillation or flutter.  - cardiac monitoring  - consider starting BB if BP remains within normal limits  - continue to monitor HR   Elevated BNP  Patient with BNP of 686.3 on admission. Patient with trace lower extremity edema. Patient denies shortness of breath or orthopnea and does not have crackles on physical exam. Patient does report shortness of breath with exertion on an outpatient basis. Given patient's lack of medical care over the years, will consider if this is potential new onset heart failure.  -Follow-up echocardiogram  ?AKI, CKD3b Elevated creatinine 1.84 on admission,currently 1.43.Microalbumin elevated at 332.5 -continueIV fluids - BMP -monitor I/Os  Hyperlipidemia  Lipid panel on admission shows cholesterol 304, triglycerides 300, HDL low at 33, VLDL elevated at 60, and LDL elevated at 211.  -atorvastatin80 mg daily  FEN/GI: carb modified diet   Prophylaxis: enoxaparin Disposition: d/c to SNF  Subjective:   No acute events overnight. Patient states he wasn't as nauseous and had not vomited overnight or this morning. He stated he was interested in SNF after discharge. He stated he was able to smell but still could not taste. I discussed with him about his dental abscesses which could be contributing.    Objective: Temp:  [97.9 F (36.6 C)-98.6 F (37 C)] 98.4 F (36.9 C) (08/24 0447) Pulse Rate:  [98-106] 98 (08/24 0500) Resp:  [12-20] 19 (08/24 0500) BP: (118-133)/(75-87) 118/79 (08/24 0500) SpO2:  [94 %-  95 %] 95 % (08/24 0500) Weight:  [120.5 kg] 120.5 kg (08/24 0447) Physical Exam: General: well  appearing, in no acute distress  Cardiovascular: RRR. No murmurs, rubs, or gallops Respiratory: CTA bilaterally Abdomen: soft, obese, non tender, non distended  Extremities: warm, dry. Distal pulses 2+ bilaterally   Laboratory: Recent Labs  Lab 12/13/19 0856 12/13/19 1647 12/13/19 1654  WBC 12.1* 14.4*  --   HGB 18.8* 18.6* 19.0*  HCT 59.3* 57.6* 56.0*  PLT 265 237  --    Recent Labs  Lab 12/13/19 1647 12/13/19 1654 12/14/19 0609 12/14/19 2144 12/15/19 0756  NA 148*   < > 152* 151* 154*  K 4.9   < > 4.4 4.0 3.9  CL 111   < > 116* 118* 117*  CO2 9*   < > 19* 18* 23  BUN 33*   < > 27* 20 16  CREATININE 1.94*   < > 1.37* 1.43* 1.38*  CALCIUM 9.8   < > 9.6 8.9 9.2  PROT 8.9*  --   --   --   --   BILITOT 2.4*  --   --   --   --   ALKPHOS 147*  --   --   --   --   ALT 47*  --   --   --   --   AST 26  --   --   --   --   GLUCOSE 482*   < > 184* 388* 371*   < > = values in this interval not displayed.     Imaging/Diagnostic Tests:  None new   Shary Key, DO 12/16/2019, 5:44 AM PGY-1, [place] Intern pager: [phone], text pages welcome

## 2019-12-16 NOTE — Social Work (Signed)
8/24:CSW sent email to Bill Torres with [employer] to follow up on Medicaid and Disability application.

## 2019-12-17 ENCOUNTER — Inpatient Hospital Stay: Payer: Medicaid Other

## 2019-12-17 DIAGNOSIS — Z23 Encounter for immunization: Secondary | ICD-10-CM

## 2019-12-17 DIAGNOSIS — E1169 Type 2 diabetes mellitus with other specified complication: Secondary | ICD-10-CM

## 2019-12-17 DIAGNOSIS — E785 Hyperlipidemia, unspecified: Secondary | ICD-10-CM

## 2019-12-17 LAB — GLUCOSE, CAPILLARY
Glucose-Capillary: 267 mg/dL — ABNORMAL HIGH (ref 70–99)
Glucose-Capillary: 288 mg/dL — ABNORMAL HIGH (ref 70–99)
Glucose-Capillary: 293 mg/dL — ABNORMAL HIGH (ref 70–99)
Glucose-Capillary: 329 mg/dL — ABNORMAL HIGH (ref 70–99)
Glucose-Capillary: 341 mg/dL — ABNORMAL HIGH (ref 70–99)
Glucose-Capillary: 347 mg/dL — ABNORMAL HIGH (ref 70–99)
Glucose-Capillary: 354 mg/dL — ABNORMAL HIGH (ref 70–99)

## 2019-12-17 LAB — BASIC METABOLIC PANEL WITH GFR
Anion gap: 15 (ref 5–15)
BUN: 10 mg/dL (ref 8–23)
CO2: 21 mmol/L — ABNORMAL LOW (ref 22–32)
Calcium: 8.7 mg/dL — ABNORMAL LOW (ref 8.9–10.3)
Chloride: 107 mmol/L (ref 98–111)
Creatinine, Ser: 0.94 mg/dL (ref 0.61–1.24)
GFR calc Af Amer: >60 mL/min
GFR calc non Af Amer: >60 mL/min
Glucose, Bld: 370 mg/dL — ABNORMAL HIGH (ref 70–99)
Potassium: 4.1 mmol/L (ref 3.5–5.1)
Sodium: 143 mmol/L (ref 135–145)

## 2019-12-17 MED ORDER — SENNA 8.6 MG PO TABS
1.0000 | ORAL_TABLET | Freq: Every day | ORAL | Status: DC
  Administered 2019-12-17 – 2019-12-23 (×5): 8.6 mg via ORAL
  Filled 2019-12-17 (×6): qty 1

## 2019-12-17 MED ORDER — METFORMIN HCL 500 MG PO TABS
500.0000 mg | ORAL_TABLET | Freq: Every day | ORAL | Status: DC
  Administered 2019-12-18 – 2019-12-21 (×4): 500 mg via ORAL
  Filled 2019-12-17 (×4): qty 1

## 2019-12-17 MED ORDER — POLYETHYLENE GLYCOL 3350 17 G PO PACK
17.0000 g | PACK | Freq: Every day | ORAL | Status: DC
  Administered 2019-12-17 – 2019-12-19 (×3): 17 g via ORAL
  Filled 2019-12-17 (×4): qty 1

## 2019-12-17 MED ORDER — ONDANSETRON HCL 4 MG PO TABS: 4.0000 mg | ORAL_TABLET | Freq: Three times a day (TID) | ORAL | Status: DC | PRN

## 2019-12-17 MED ORDER — INSULIN GLARGINE 100 UNIT/ML ~~LOC~~ SOLN
50.0000 [IU] | Freq: Every day | SUBCUTANEOUS | Status: DC
  Administered 2019-12-18: 50 [IU] via SUBCUTANEOUS
  Filled 2019-12-17 (×2): qty 0.5

## 2019-12-17 NOTE — TOC Progression Note (Signed)
Transition of Care [facility]) - Progression Note    Patient Details  Name: Bill Torres MRN: [id number] Date of Birth: 1955-07-17  [facility] CM/SW Contact  Zenon Mayo, RN Phone Number: 12/17/2019, 2:57 PM  Clinical Narrative:    Patient's daughter Bill Torres  In the room wanted to speak to someone about medicaid.  NCM left message for financial counselor , Elie Confer Revels to call her at her cell phone number at [phone].          Expected Discharge Plan and Services                                                 Social Determinants of Health (SDOH) Interventions    Readmission Risk Interventions No flowsheet data found.

## 2019-12-17 NOTE — Progress Notes (Addendum)
Physical Therapy Treatment Patient Details Name: Bill Torres MRN: [id number] DOB: 03-01-1956 Today's Date: 12/17/2019    History of Present Illness Bill Torres is a 64 y.o. male who presened with generalized weakness and hyperglycemia, likely DKA. pt with x2 weeks with no taste or smell with negative COVID test. PMH is significant for obesity and no prior medical care     PT Comments    Pt admitted with above diagnosis. Pt was able to ambulate with min guard assist with RW with good safety overall and no LOb.  VSS. Pt feels weak and still wants to do SNF for rehab. This PT agrees. Updated frequency.  Pt currently with functional limitations due to balance and endurance deficits. Pt will benefit from skilled PT to increase their independence and safety with mobility to allow discharge to the venue listed below.     Follow Up Recommendations  SNF;Supervision/Assistance - 24 hour (Depends on progress, daughter works per pt)     Clinical biochemist with 5" wheels;3in1 (PT)    Recommendations for Other Services       Precautions / Restrictions Precautions Precautions: Fall Restrictions Weight Bearing Restrictions: No    Mobility  Bed Mobility Overal bed mobility: Needs Assistance Bed Mobility: Rolling;Supine to Sit;Sit to Supine Rolling: Min assist Sidelying to sit: Min assist       General bed mobility comments: pt requires min asssist to come to EOB.   Transfers Overall transfer level: Needs assistance Equipment used: Rolling walker (2 wheeled) Transfers: Sit to/from [facility] Sit to Stand: From elevated surface;Supervision;Min guard         General transfer comment: Pt was able to ambulate with min guard assist with RW in room.  Refused to leave room stating he doesnt walk that much at home.  Ambulation/Gait Ambulation/Gait assistance: Min guard;Min assist Gait Distance (Feet): 55 Feet Assistive device:  Rolling walker (2 wheeled) Gait Pattern/deviations: Step-through pattern;Decreased stride length   Gait velocity interpretation: <1.31 ft/sec, indicative of household ambulator General Gait Details: Pt steady with RW but limited distance today per pt request. Walked to bathroom and then back to chair. Urinated on toilet only.     Stairs             Wheelchair Mobility    Modified Rankin (Stroke Patients Only)       Balance Overall balance assessment: Needs assistance Sitting-balance support: No upper extremity supported;Feet supported Sitting balance-Leahy Scale: Fair     Standing balance support: Bilateral upper extremity supported;During functional activity Standing balance-Leahy Scale: Poor Standing balance comment: relies on UE support.                             Cognition Arousal/Alertness: Awake/alert Behavior During Therapy: WFL for tasks assessed/performed Overall Cognitive Status: Impaired/Different from baseline Area of Impairment: Awareness                                      Exercises General Exercises - Lower Extremity Long Arc Quad: AROM;Both;10 reps;Seated Hip Flexion/Marching: AROM;Both;10 reps;Seated    General Comments        Pertinent Vitals/Pain Pain Assessment: No/denies pain    Home Living                      Prior Function  PT Goals (current goals can now be found in the care plan section) Acute Rehab PT Goals Patient Stated Goal: to be able to get some grape fruit so i can taste Progress towards PT goals: Progressing toward goals    Frequency    Min 2X/week      PT Plan Current plan remains appropriate;Frequency needs to be updated    Co-evaluation              AM-PAC PT "6 Clicks" Mobility   Outcome Measure  Help needed turning from your back to your side while in a flat bed without using bedrails?: A Little Help needed moving from lying on your back to  sitting on the side of a flat bed without using bedrails?: A Little Help needed moving to and from a bed to a chair (including a wheelchair)?: A Little Help needed standing up from a chair using your arms (e.g., wheelchair or bedside chair)?: A Little Help needed to walk in hospital room?: A Little Help needed climbing 3-5 steps with a railing? : A Lot 6 Click Score: 17    End of Session Equipment Utilized During Treatment: Gait belt Activity Tolerance: Patient limited by fatigue Patient left: with call bell/phone within reach;in chair;with chair alarm set Nurse Communication: Mobility status PT Visit Diagnosis: Muscle weakness (generalized) (M62.81)     Time: 7510-2585 PT Time Calculation (min) (ACUTE ONLY): 28 min  Charges:  $Gait Training: 23-37 mins                     Bill Torres W,PT Acute Rehabilitation Services Pager:  [phone]  Office:  [place] 12/17/2019, 1:45 PM

## 2019-12-17 NOTE — Progress Notes (Signed)
   Covid-19 Vaccination Clinic  Name:  Bill Torres    MRN: [id number] DOB: 1955-10-10  12/17/2019  [name] was observed post Covid-19 immunization for 15 minutes without incident. He was provided with Vaccine Information Sheet and instruction to access the V-Safe system.   [name] was instructed to call 911 with any severe reactions post vaccine: Marland Kitchen Difficulty breathing  . Swelling of face and throat  . A fast heartbeat  . A bad rash all over body  . Dizziness and weakness   Immunizations Administered    Name Date Dose VIS Date Route   Moderna COVID-19 Vaccine 12/17/2019 11:12 AM 0.5 mL 03/2019 Intramuscular   Manufacturer: Moderna   Lot: [id number]   [place]: 08168-387-06

## 2019-12-17 NOTE — Progress Notes (Signed)
Family Medicine Teaching Service Daily Progress Note Intern Pager: [phone]  Patient name: Bill Torres Medical record number: [id number] Date of birth: 1956-02-24 Age: 64 y.o. Gender: male  Primary Care Provider: Patient, No Pcp Per Consultants: None  Code Status: Full  Major Events:  8/21: admitted for DKA, placed on insulin gtt 8/22:Transitionedto subcutaneous insulin 8/25: Pfizer COVID vaccine dose 1  Assessment and Plan: Bill Torres is a 64 y.o. male who presented with generalized weakness and hyperglycemia, likely DKA. PMH is significant for obesity and no prior medical care.Patient is medically stable for discharge to SNF.  DKA  Newly diagnosed T2DM Patient transitioned from insulin drip to subcutaneous insulinon 8/22. On admission, hgb A1c13.2, urine microalbuminelevated at 332.5.CBG this am is 347. - continue mIVFs - Increasing Lantus to 50 units daily  - added Metformin 500mg  daily  - sensitive sliding scale insulin - CBGmonitoring before meals and at bedtime -daily BMP - strict I/O - cardiac monitoring, continuous pulse ox - vitals per unit routine  Hypernatremia Na+ currently 143. 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 - Continue to monitor - Amoxicillin 500mg  TID 14 days -Consult Dr. Enrique Sack, appreciate recommendations - Patient received 1st dose of COVID-19 vaccine today, 8/25  Generalized Weakness  Patientreportedweakness on admission. Patient requires assistance with sitting upright in bed for pulmonary exam. Likely deconditioning from being sick with DKA for the last few days.  -PT/OT consults: PT and OT both  recommend SNF on d/c to maximize function prior to going home with daughter who works. Recommends rolling walker   Supraventricular Tachycardia  No reported medical history of tachycardia as patient has not been to physician. Patient denies sensation of palpitations. Tachycardia considered to be due to hypotension compensatory response, however, BP has improved on mIVFs. Review of ECG does not show signs of atrial fibrillation orflutter.  - cardiac monitoring  - consider starting BB if BP remains within normal limits  - continue to monitor HR   Elevated BNP  Patient with BNP of 686.3 on admission. Patient with trace lower extremity edema. Patient denies shortness of breath or orthopnea and does not have crackles on physical exam. Patient does report shortness of breath with exertion on an outpatient basis. Given patient's lack of medical care over the years, will consider if this is potential new onset heart failure.  -Follow-up echocardiogram  ?AKI, CKD3b Elevated creatinine 1.84 on admission,currently 0.94.Microalbumin on admission elevated at 332.5 -continueIV fluids - BMP -monitor I/Os  Hyperlipidemia Lipid panel on admission shows cholesterol 304, triglycerides 300, HDL low at 33, VLDL elevated at 60, and LDL elevated at 211.  -atorvastatin80 mg daily  TOC consult - assisting with Medicaid and disability application   FEN/GI: carb modified diet  Prophylaxis: enoxaparin  Disposition: d/c to SNF  Subjective:   No acute events overnight. Patient stated he was feeling OK and denies nausea or vomiting overnight or this morning. States he has not had a bowel movement for several days, but that it is normal for him. Advised that he will have a stool softener available to him if needed. Denies any other compalints   Objective: Temp:  [97.9 F (36.6 C)-98.5 F (36.9 C)] 97.9 F (36.6 C) (08/25 0400) Pulse Rate:  [85-97] 87 (08/25 0401) Resp:  [  12-23] 17 (08/25  0401) BP: (111-128)/(68-80) 111/68 (08/25 0400) SpO2:  [76 %-97 %] 92 % (08/25 0401) Weight:  [121.8 kg] 121.8 kg (08/25 0012) Physical Exam: General: well appearing, in no acute distress   Cardiovascular: RRR No murmurs appreciated Respiratory: CTA bilaterally Abdomen: soft, obese, non- distended Extremities: warm, dry. No edema. Distal pulses 2+ bilaterally   Laboratory: Recent Labs  Lab 12/13/19 0856 12/13/19 1647 12/13/19 1654  WBC 12.1* 14.4*  --   HGB 18.8* 18.6* 19.0*  HCT 59.3* 57.6* 56.0*  PLT 265 237  --    Recent Labs  Lab 12/13/19 1647 12/13/19 1654 12/14/19 2144 12/15/19 0756 12/16/19 0704  NA 148*   < > 151* 154* 148*  K 4.9   < > 4.0 3.9 3.7  CL 111   < > 118* 117* 109  CO2 9*   < > 18* 23 26  BUN 33*   < > 20 16 10   CREATININE 1.94*   < > 1.43* 1.38* 1.16  CALCIUM 9.8   < > 8.9 9.2 9.1  PROT 8.9*  --   --   --   --   BILITOT 2.4*  --   --   --   --   ALKPHOS 147*  --   --   --   --   ALT 47*  --   --   --   --   AST 26  --   --   --   --   GLUCOSE 482*   < > 388* 371* 341*   < > = values in this interval not displayed.     Imaging/Diagnostic Tests:  No new images   Shary Key, DO 12/17/2019, 5:54 AM PGY-1, [place] Intern pager: [phone], text pages welcome

## 2019-12-18 ENCOUNTER — Inpatient Hospital Stay (HOSPITAL_COMMUNITY): Payer: Medicaid Other

## 2019-12-18 LAB — GLUCOSE, CAPILLARY
Glucose-Capillary: 251 mg/dL — ABNORMAL HIGH (ref 70–99)
Glucose-Capillary: 274 mg/dL — ABNORMAL HIGH (ref 70–99)
Glucose-Capillary: 276 mg/dL — ABNORMAL HIGH (ref 70–99)
Glucose-Capillary: 287 mg/dL — ABNORMAL HIGH (ref 70–99)
Glucose-Capillary: 298 mg/dL — ABNORMAL HIGH (ref 70–99)
Glucose-Capillary: 334 mg/dL — ABNORMAL HIGH (ref 70–99)

## 2019-12-18 LAB — BASIC METABOLIC PANEL
Anion gap: 12 (ref 5–15)
BUN: 8 mg/dL (ref 8–23)
CO2: 26 mmol/L (ref 22–32)
Calcium: 9 mg/dL (ref 8.9–10.3)
Chloride: 107 mmol/L (ref 98–111)
Creatinine, Ser: 0.94 mg/dL (ref 0.61–1.24)
GFR calc Af Amer: >60 mL/min (ref >60)
GFR calc non Af Amer: >60 mL/min (ref >60)
Glucose, Bld: 281 mg/dL — ABNORMAL HIGH (ref 70–99)
Potassium: 3.4 mmol/L — ABNORMAL LOW (ref 3.5–5.1)
Sodium: 145 mmol/L (ref 135–145)

## 2019-12-18 LAB — ZINC: Zinc: 84 ug/dL (ref 44–115)

## 2019-12-18 NOTE — Progress Notes (Signed)
Family Medicine Teaching Service Daily Progress Note Intern Pager: [phone]  Patient name: Bill Torres Medical record number: [id number] Date of birth: 07-Jul-1955 Age: 64 y.o. Gender: male  Primary Care Provider: Patient, No Pcp Per Consultants: None Code Status: Full   Major Events:  8/21: admitted for DKA, placed on insulin gtt 8/22:Transitionedto subcutaneous insulin 8/25: Moderna COVID vaccine dose 1  Assessment and Plan: Bill Torres is a 64 y.o. male who presented with generalized weakness and hyperglycemia, likely DKA. PMH is significant for obesity and no prior medical care.Patient is medically stable for discharge to SNF.  DKA  Newly diagnosed T2DM Patient transitioned from insulin drip to subcutaneous insulinon 8/22. On admission, hgb A1c13.2, urine microalbuminelevated at 332.5.CBG this am is 298 down from 347 yesterday morning. 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 - Continue to monitor - Amoxicillin 500mg  TID 14 days -Left message for Dr. Enrique Sack with my call back number, appreciate recommendations - Patientreceived 1st dose of COVID-19 vaccineyesterday, 8/25  Generalized Weakness  Patientreportedweakness on admission. Patient requires assistance with sitting upright in bed for pulmonary exam. Likely deconditioning from being sick with DKA for the last  few days.  -PT/OT consults: PT and OT both recommend SNF on d/c to maximize function prior to going home with daughter who works. Recommends rolling walker  Elevated BNP  Patient with BNP of 686.3 on admission. Patient with trace lower extremity edema. Patient denies shortness of breath or orthopnea and does not have crackles on physical exam. Patient does report shortness of breath with exertion on an outpatient basis. Given patient's lack of medical care over the years, will consider if this is potential new onset heart failure.  -Follow-up echocardiogram  ?AKI, CKD3b Elevated creatinine 1.84 on admission,currently 0.94.Microalbumin on admission elevated at 332.5 -continueIV fluids - BMP -monitor I/Os  Hyperlipidemia Lipid panel on admission shows cholesterol 304, triglycerides 300, HDL low at 33, VLDL elevated at 60, and LDL elevated at 211.  -atorvastatin80 mg daily  TOC consult - assisting with Medicaid and disability application   FEN/GI: carb modified diet  Prophylaxis: enoxaparin  Disposition: d/c to SNF  Subjective:   No acute events overnight. Patient complained of lower abdominal pain and diarrhea this morning which could be attributable to his Metformin he was starting on yesterday. Denies painful urination or bowel movements.   Objective: Temp:  [97.6 F (36.4 C)-99 F (37.2 C)] 99 F (37.2 C) (08/26 0412) Pulse Rate:  [92-100] 92 (08/26 0000) Resp:  [14-20] 14 (08/26 0000) BP: (96-107)/(63-82) 107/75 (08/26 0000) SpO2:  [96 %-100 %] 100 % (08/26 0000) Weight:  [123.5 kg] 123.5 kg (08/26 0412) Physical Exam: General: well appearing, in no acute distress  Cardiovascular: RRR. No murmurs appreciated Respiratory: CTA bilaterally. Normal WOB Abdomen: soft, obese, non-distended  Extremities: warm, dry. No edema. Distal pulses 2+ bilaterally   Laboratory: Recent Labs  Lab 12/13/19 0856 12/13/19 1647 12/13/19 1654  WBC 12.1* 14.4*  --   HGB  18.8* 18.6* 19.0*  HCT  59.3* 57.6* 56.0*  PLT 265 237  --    Recent Labs  Lab 12/13/19 1647 12/13/19 1654 12/15/19 0756 12/16/19 0704 12/17/19 0636  NA 148*   < > 154* 148* 143  K 4.9   < > 3.9 3.7 4.1  CL 111   < > 117* 109 107  CO2 9*   < > 23 26 21*  BUN 33*   < > 16 10 10   CREATININE 1.94*   < > 1.38* 1.16 0.94  CALCIUM 9.8   < > 9.2 9.1 8.7*  PROT 8.9*  --   --   --   --   BILITOT 2.4*  --   --   --   --   ALKPHOS 147*  --   --   --   --   ALT 47*  --   --   --   --   AST 26  --   --   --   --   GLUCOSE 482*   < > 371* 341* 370*   < > = values in this interval not displayed.     Imaging/Diagnostic Tests:  No new images   Shary Key, DO 12/18/2019, 7:00 AM PGY-1, [place] Intern pager: [phone], text pages welcome

## 2019-12-18 NOTE — Plan of Care (Signed)
  RD consulted for nutrition education regarding diabetes.   Lab Results  Component Value Date   HGBA1C 13.2 (H) 12/13/2019   Spoke with pt at bedside, who appeared anxious and withdrawn at time of visit. He reports that this is his first time in the hospital and his new diabetes diagnosis is very concerning to him. He shares with this RD that he started experiencing taste changes about 3 weeks ago, which progressively started getting worse ("even water tasted funny"). Pt was consuming mostly fruit and soft drinks at this time, which was the only thing that was palatable to him. Since admission, pt reports taste changes are still present, but improved. He complained of a dry mouth, which was inhibiting his concentration; RD provided pt with a sprite zero and ice chips for relief.   RD actively listened to pt's concerns about diabetes diagnosis. He reports that he has been educated about carbohydrates, serving sizes, and insulin injections. He has practiced injecting himself a little bit, but still feels very uncomfortable doing this. He reports he is willing to change his diet, but feels overwhelmed, especially when nothing currently tastes good.   Focus on RD session was on self-management for diabetes. RD discussed importance of taking medications and checking blood sugars, as well as regular PCP contact. Also discussed low calorie drink options (pt did not care for diet Sprite that he was given, but states "it's tolerable and better than water"). Pt shares with this RD that his daughter who helps care for him also has DM, which is helpful as a support. Used reflective listening and positive self talk to empower pt to learn about DM self-care and commit to behaviors that he thinks he can control currently (learning insulin injections, finding a sugar free soft drink he prefers, and getting comfortable using a glucometer). Pt became more open to this RD towards end of session after listening to his  concerns. Case discussed with RN, who reports she will continue to assist pt with insulin injections. Pt expressed appreciation for RD visit.   RD provided "Carbohydrate Counting for People with Diabetes" handout from the Academy of Nutrition and Dietetics. Discussed different food groups and their effects on blood sugar, emphasizing carbohydrate-containing foods. Provided list of carbohydrates and recommended serving sizes of common foods.  Discussed importance of controlled and consistent carbohydrate intake throughout the day. Provided examples of ways to balance meals/snacks and encouraged intake of high-fiber, whole grain complex carbohydrates. Teach back method used.  Expect fair compliance.  Current diet order is carb modified, patient is consuming approximately 25-75% of meals at this time. Labs and medications reviewed. No further nutrition interventions warranted at this time. RD contact information provided. If additional nutrition issues arise, please re-consult RD.  Loistine Chance, RD, LDN, [place] Registered Dietitian II Certified Diabetes Care and Education Specialist Please refer to [facility] for RD and/or RD on-call/weekend/after hours pager

## 2019-12-19 DIAGNOSIS — R1909 Other intra-abdominal and pelvic swelling, mass and lump: Secondary | ICD-10-CM

## 2019-12-19 LAB — GLUCOSE, CAPILLARY
Glucose-Capillary: 243 mg/dL — ABNORMAL HIGH (ref 70–99)
Glucose-Capillary: 244 mg/dL — ABNORMAL HIGH (ref 70–99)
Glucose-Capillary: 257 mg/dL — ABNORMAL HIGH (ref 70–99)
Glucose-Capillary: 265 mg/dL — ABNORMAL HIGH (ref 70–99)
Glucose-Capillary: 271 mg/dL — ABNORMAL HIGH (ref 70–99)
Glucose-Capillary: 301 mg/dL — ABNORMAL HIGH (ref 70–99)

## 2019-12-19 MED ORDER — ENOXAPARIN SODIUM 80 MG/0.8ML ~~LOC~~ SOLN: 65.0000 mg | SUBCUTANEOUS | Status: DC

## 2019-12-19 MED ORDER — ENOXAPARIN SODIUM 60 MG/0.6ML ~~LOC~~ SOLN
60.0000 mg | SUBCUTANEOUS | Status: DC
  Administered 2019-12-19 – 2019-12-22 (×4): 60 mg via SUBCUTANEOUS
  Filled 2019-12-19 (×4): qty 0.6

## 2019-12-19 MED ORDER — POTASSIUM CHLORIDE CRYS ER 20 MEQ PO TBCR
40.0000 meq | EXTENDED_RELEASE_TABLET | ORAL | Status: AC
  Administered 2019-12-19 (×2): 40 meq via ORAL
  Filled 2019-12-19 (×2): qty 2

## 2019-12-19 MED ORDER — INSULIN GLARGINE 100 UNIT/ML ~~LOC~~ SOLN
65.0000 [IU] | Freq: Every day | SUBCUTANEOUS | Status: DC
  Administered 2019-12-19 – 2019-12-20 (×2): 65 [IU] via SUBCUTANEOUS
  Filled 2019-12-19 (×3): qty 0.65

## 2019-12-19 MED ORDER — INSULIN ASPART 100 UNIT/ML ~~LOC~~ SOLN
0.0000 [IU] | Freq: Three times a day (TID) | SUBCUTANEOUS | Status: DC
  Administered 2019-12-19: 11 [IU] via SUBCUTANEOUS
  Administered 2019-12-19: 5 [IU] via SUBCUTANEOUS
  Administered 2019-12-20 – 2019-12-21 (×4): 8 [IU] via SUBCUTANEOUS
  Administered 2019-12-21: 3 [IU] via SUBCUTANEOUS
  Administered 2019-12-21: 5 [IU] via SUBCUTANEOUS
  Administered 2019-12-22: 8 [IU] via SUBCUTANEOUS
  Administered 2019-12-22: 5 [IU] via SUBCUTANEOUS
  Administered 2019-12-22: 8 [IU] via SUBCUTANEOUS
  Administered 2019-12-23: 11 [IU] via SUBCUTANEOUS
  Administered 2019-12-23: 5 [IU] via SUBCUTANEOUS

## 2019-12-19 NOTE — Progress Notes (Signed)
Inpatient Diabetes Program Recommendations  AACE/ADA: New Consensus Statement on Inpatient Glycemic Control (2015)  Target Ranges:  Prepandial:   less than 140 mg/dL      Peak postprandial:   less than 180 mg/dL (1-2 hours)      Critically ill patients:  140 - 180 mg/dL   Lab Results  Component Value Date   GLUCAP 301 (H) 12/19/2019   HGBA1C 13.2 (H) 12/13/2019    Review of Glycemic Control Results for Bill Torres, Bill Torres (MRN [id number]) as of 12/19/2019 14:01  Ref. Range 12/14/2019 21:44 12/15/2019 07:56 12/16/2019 07:04 12/17/2019 06:36 12/18/2019 07:33  Glucose Latest Ref Range: 70 - 99 mg/dL 388 (H) 371 (H) 341 (H) 370 (H) 281 (H)   Diabetes history: DM 2- new diagnosis Current orders for Inpatient glycemic control:  Novolog moderate tid with meals and HS Lantus 65 units daily Metformin 500 mg bid  Inpatient Diabetes Program Recommendations:    Spoke with patient today.  He states that he has been giving his own injections.  We discussed normal blood sugar values.  He seemed more engaged and stated that his daughter can help him with checking his blood sugars and administering insulin.  Attempted to show him insulin pen again bu he stated that he's already seen it. I sent insulin pen video to patient's daughter on Tuesday as well. He states he has no further questions.  We did review normal blood sugar values of 80-130 and goals.    Agree with increase in Lantus.  May need to increase Metformin to bid as well to help with insulin resistance.    Thanks,  Adah Perl, RN, BC-ADM Inpatient Diabetes Coordinator Pager [phone] (8a-5p)

## 2019-12-19 NOTE — Progress Notes (Signed)
Family Medicine Teaching Service Daily Progress Note Intern Pager: [phone]  Patient name: Bill Torres Medical record number: [id number] Date of birth: 1955-06-30 Age: 64 y.o. Gender: male  Primary Care Provider: Patient, No Pcp Per Consultants: None Code Status: Full   Major Events:  8/21: admitted for DKA, placed on insulin gtt 8/22:Transitionedto subcutaneous insulin 8/25: Moderna COVID vaccine dose 1  Assessment and Plan: Bill Torres is a 64 y.o. male who presented with generalized weakness and hyperglycemia, likely DKA. PMH is significant for obesity and no prior medical care.Patient is medically stable for discharge to SNF.  DKA  Newly diagnosed T2DM Patient transitioned from insulin drip to subcutaneous insulinon 8/22. On admission, hgb A1c13.2, urine microalbuminelevated at 332.5.CBG this am is 244.  -Increased Lantus from 50 units to 65 today.  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 Both are improving.Maxillofacial CT shows poor dentition with numerous absent teeth and dental caries, andperiapical abscesses involving the residual left mandibular molar, mandibular incisors, and maxillary incisors. - Continue to monitor -Amoxicillin 500mg  TID 14 days -Left message for Dr. Enrique Sack with my call back number, appreciate recommendations - Patientreceived1st dose ofCOVID-19 vaccine 8/25  Lipoma Patient complained of lower right sided groin pain yesterday, 8/26 that he thought to be due to a hernia. 4-5 cm somewhat well circumcised mass palpated  Soft tissue ultrasound showed a lipoma. Patient reports today that it is not painful but just pulsates occasionally.   - will monitor   UTI Urine culture positive for group B strep. Patient  is asymptomatic  - already treating patient with Amoxicillin. Will continue to monitor   Generalized Weakness  Patientreportedweakness on admission. Patient requires assistance with sitting upright in bed for pulmonary exam. Likely deconditioning from being sick with DKA for the last few days.  -PT/OT consults: PT and OT both recommend SNF on d/c to maximize function prior to going home with daughter who works. Recommends rolling walker  Hyperlipidemia Lipid panel on admission shows cholesterol 304, triglycerides 300, HDL low at 33, VLDL elevated at 60, and LDL elevated at 211.  -atorvastatin80 mg daily  TOC consult - assisting with Medicaid and disability application  FEN/GI: carb modified diet  Prophylaxis: enoxaparin  Disposition:d/c to SNF as soon as available  Subjective:  No acute events overnight. Patient laying with an ice pack over his head stating it helps him cool down. Denies fever or pain and was comfortable with the current temperature. Patient denies any nausea or vomiting and states that his sense of smell and taste are improving. He denies any current abdominal pain and states that his lipoma is not painful but that it pulsates occasionally.  I asked patient how he is doing mentally and coping with everything and he states that he is fine and doing well and that he is not interested in talking to psych at this time  Advised him to please reach out if needed and if he wants to utilize the resources we have available to him.  Objective: Temp:  [98 F (36.7 C)-98.8 F (37.1 C)] 98.8 F (37.1 C) (08/27 0500) Pulse Rate:  [92-99] 92 (08/27 0500) Resp:  [17-21] 17 (08/27 0500) BP: (97-111)/(71-88) 107/88 (08/27 0500) SpO2:  [93 %-100 %] 98 % (08/27 0500) Weight:  [121.9 kg] 121.9 kg (08/27 0500) Physical Exam:  General: well appearing, in no acute distress  Cardiovascular: RRR no murmurs  Respiratory: CTA bilaterally.  Abdomen: soft,  non-distended Extremities: warm, dry. No edema. Distal pulses 2+ bilaterally   Laboratory: Recent Labs  Lab 12/13/19 0856 12/13/19 1647 12/13/19 1654  WBC 12.1* 14.4*  --   HGB 18.8* 18.6* 19.0*  HCT 59.3* 57.6* 56.0*  PLT 265 237  --    Recent Labs  Lab 12/13/19 1647 12/13/19 1654 12/16/19 0704 12/17/19 0636 12/18/19 0733  NA 148*   < > 148* 143 145  K 4.9   < > 3.7 4.1 3.4*  CL 111   < > 109 107 107  CO2 9*   < > 26 21* 26  BUN 33*   < > 10 10 8   CREATININE 1.94*   < > 1.16 0.94 0.94  CALCIUM 9.8   < > 9.1 8.7* 9.0  PROT 8.9*  --   --   --   --   BILITOT 2.4*  --   --   --   --   ALKPHOS 147*  --   --   --   --   ALT 47*  --   --   --   --   AST 26  --   --   --   --   GLUCOSE 482*   < > 341* 370* 281*   < > = values in this interval not displayed.    Imaging/Diagnostic Tests:  [place] CHEST SOFT TISSUE Changes consistent with a subcutaneous lipoma as described. Clinical follow-up is recommended.   Shary Key, DO 12/19/2019, 5:30 AM PGY-1, [place] Intern pager: [phone], text pages welcome

## 2019-12-20 DIAGNOSIS — R1909 Other intra-abdominal and pelvic swelling, mass and lump: Secondary | ICD-10-CM

## 2019-12-20 DIAGNOSIS — I517 Cardiomegaly: Secondary | ICD-10-CM

## 2019-12-20 DIAGNOSIS — E119 Type 2 diabetes mellitus without complications: Secondary | ICD-10-CM

## 2019-12-20 LAB — GLUCOSE, CAPILLARY
Glucose-Capillary: 226 mg/dL — ABNORMAL HIGH (ref 70–99)
Glucose-Capillary: 245 mg/dL — ABNORMAL HIGH (ref 70–99)
Glucose-Capillary: 262 mg/dL — ABNORMAL HIGH (ref 70–99)
Glucose-Capillary: 266 mg/dL — ABNORMAL HIGH (ref 70–99)
Glucose-Capillary: 269 mg/dL — ABNORMAL HIGH (ref 70–99)
Glucose-Capillary: 279 mg/dL — ABNORMAL HIGH (ref 70–99)

## 2019-12-20 MED ORDER — INSULIN GLARGINE 100 UNIT/ML ~~LOC~~ SOLN
10.0000 [IU] | Freq: Once | SUBCUTANEOUS | Status: AC
  Administered 2019-12-20: 10 [IU] via SUBCUTANEOUS
  Filled 2019-12-20: qty 0.1

## 2019-12-20 MED ORDER — INSULIN GLARGINE 100 UNIT/ML ~~LOC~~ SOLN
75.0000 [IU] | Freq: Every day | SUBCUTANEOUS | Status: DC
  Administered 2019-12-21: 75 [IU] via SUBCUTANEOUS
  Filled 2019-12-20: qty 0.75

## 2019-12-20 NOTE — Progress Notes (Signed)
Family Medicine Teaching Service Daily Progress Note Intern Pager: [phone]  Patient name: Bill Torres Medical record number: [id number] Date of birth: 03/20/1956 Age: 64 y.o. Gender: male  Primary Care Provider: Patient, No Pcp Torres Consultants: None Code Status: Full  Pt Overview and Major Events to Date:  8/21 admitted for likely DKA, placed on insulin drip 8/22 transitioned to subcu insulin 8/25 dose #1 Moderna Covid vaccine  Assessment and Plan: Bill Torres is a 64 year old male who presented with generalized weakness and hyperglycemia and was treated for DKA.  PMHx includes obesity and no prior medical care.  Patient is medically stable for discharge to SNF.  DKA  newly diagnosed type 2 diabetes CBG this morning 266.  Continue CBG monitoring before meals and at bedtime, along with routine vitals. Metformin 500 mg was discontinued yesterday due to diarrhea. -Continue Lantus 65 units (8/27-) -Continue moderate sliding scale insulin -Consider metformin XR for glucose control with reduced side effects  Anosmia and dysgeusia  recent dose #1 Moderna Covid vaccine Both are improving.  Likely contributing are poor dentition and periapical abscesses.  Treating with amoxicillin 500 mg 3 times daily for 14 days (8/23-9/6).   -Recommend second dose Covid vaccine 9/15 -Continue amoxicillin until 9/6  Lipoma of right groin Confirmed with ultrasound, negative for hernia. -Continue to monitor  UTI Patient continues to be asymptomatic.  Already treating with amoxicillin. -Continue to monitor -Continue amoxacillin as above  Generalized weakness Likely deconditioning from hospital stay thus far.  PT and OT both recommend SNF upon discharge to maximize function, also recommend rolling walker. -fall precautions  Hyperlipidemia -Continue atorvastatin 80 mg daily  TOC consult Placed to assist with Medicaid and disability application. -Follow-up  FEN/GI: Carb modified  diet PPx: Enoxaparin  Disposition: Discharge to SNF as soon as available, medically stable for discharge  Subjective:  Bill Torres was found laying in bed, awake and alert.  He reported not sleeping well overnight, which he attributes to anxiety.  He said that he was just worried about his health and a lot of different things.  His only complaint this morning is that the nursing staff are taking too long to get him to the bedside commode and then off.  Otherwise, he is feeling well.  He denies fever, chills, nausea, vomiting, malaise.  Reports that his ankles and feet are little swollen.  Objective: Temp:  [98.6 F (37 C)-98.9 F (37.2 C)] 98.6 F (37 C) (08/28 0400) Pulse Rate:  [85-111] 85 (08/28 0800) Resp:  [10-23] 17 (08/28 0800) BP: (116-119)/(66-79) 119/79 (08/28 0400) SpO2:  [76 %-100 %] 100 % (08/28 0800) Weight:  [122.1 kg] 122.1 kg (08/28 0400)   Physical Exam General: Awake, alert, oriented Cardiovascular: Regular rate and rhythm, S1 and S2 present, no murmurs auscultated Respiratory: Lung fields clear to auscultation bilaterally Extremities: No palpable bilateral lower extremity edema Neuro: Cranial nerves II through X grossly intact, able to move all extremities spontaneously    Laboratory: Recent Labs  Lab 12/13/19 0856 12/13/19 1647 12/13/19 1654  WBC 12.1* 14.4*  --   HGB 18.8* 18.6* 19.0*  HCT 59.3* 57.6* 56.0*  PLT 265 237  --    Recent Labs  Lab 12/13/19 1647 12/13/19 1654 12/16/19 0704 12/17/19 0636 12/18/19 0733  NA 148*   < > 148* 143 145  K 4.9   < > 3.7 4.1 3.4*  CL 111   < > 109 107 107  CO2 9*   < > 26 21* 26  BUN 33*   < > 10 10 8   CREATININE 1.94*   < > 1.16 0.94 0.94  CALCIUM 9.8   < > 9.1 8.7* 9.0  PROT 8.9*  --   --   --   --   BILITOT 2.4*  --   --   --   --   ALKPHOS 147*  --   --   --   --   ALT 47*  --   --   --   --   AST 26  --   --   --   --   GLUCOSE 482*   < > 341* 370* 281*   < > = values in this interval not  displayed.    Imaging/Diagnostic Tests: No results found.   Ezequiel Essex, MD 12/20/2019, 8:51 AM PGY-1, [place] Intern pager: [phone], text pages welcome

## 2019-12-20 NOTE — Plan of Care (Signed)

## 2019-12-21 LAB — GLUCOSE, CAPILLARY
Glucose-Capillary: 255 mg/dL — ABNORMAL HIGH (ref 70–99)
Glucose-Capillary: 159 mg/dL — ABNORMAL HIGH (ref 70–99)
Glucose-Capillary: 224 mg/dL — ABNORMAL HIGH (ref 70–99)
Glucose-Capillary: 223 mg/dL — ABNORMAL HIGH (ref 70–99)

## 2019-12-21 LAB — BASIC METABOLIC PANEL
Anion gap: 8 (ref 5–15)
BUN: 5 mg/dL — ABNORMAL LOW (ref 8–23)
CO2: 28 mmol/L (ref 22–32)
Calcium: 8.5 mg/dL — ABNORMAL LOW (ref 8.9–10.3)
Chloride: 104 mmol/L (ref 98–111)
Creatinine, Ser: 0.94 mg/dL (ref 0.61–1.24)
GFR calc Af Amer: >60 mL/min (ref >60)
GFR calc non Af Amer: >60 mL/min (ref >60)
Glucose, Bld: 234 mg/dL — ABNORMAL HIGH (ref 70–99)
Potassium: 3.3 mmol/L — ABNORMAL LOW (ref 3.5–5.1)
Sodium: 140 mmol/L (ref 135–145)

## 2019-12-21 MED ORDER — POTASSIUM CHLORIDE CRYS ER 20 MEQ PO TBCR
40.0000 meq | EXTENDED_RELEASE_TABLET | Freq: Once | ORAL | Status: AC
  Administered 2019-12-21: 40 meq via ORAL
  Filled 2019-12-21: qty 2

## 2019-12-21 MED ORDER — INSULIN GLARGINE 100 UNIT/ML ~~LOC~~ SOLN
50.0000 [IU] | Freq: Every morning | SUBCUTANEOUS | Status: DC
  Administered 2019-12-22 – 2019-12-23 (×2): 50 [IU] via SUBCUTANEOUS
  Filled 2019-12-21 (×2): qty 0.5

## 2019-12-21 MED ORDER — INSULIN GLARGINE 100 UNIT/ML ~~LOC~~ SOLN
20.0000 [IU] | Freq: Every day | SUBCUTANEOUS | Status: DC
  Administered 2019-12-21: 20 [IU] via SUBCUTANEOUS
  Filled 2019-12-21 (×2): qty 0.2

## 2019-12-21 NOTE — Progress Notes (Addendum)
Family Medicine Teaching Service Daily Progress Note Intern Pager: [phone]  Patient name: Bill Torres Medical record number: [id number] Date of birth: [birth year] Age: 64 y.o. Gender: male  Primary Care Provider: Patient, No Pcp Per Consultants: None Code Status: Full  Major Events:  8/21: admitted for DKA, placed on insulin gtt 8/22:Transitionedto subcutaneous insulin 8/25:ModernaCOVID vaccine dose 1  Assessment and Plan: Kyjuan Gause is a 64 y.o. male who presented with generalized weakness and hyperglycemia, likely DKA. PMH is significant for obesity and no prior medical care.Patient is medically stable for discharge to SNF.  DKA  Newly diagnosed T2DM Patient transitioned from insulin drip to subcutaneous insulinon 8/22. On admission, hgb A1c13.2, urine microalbuminelevated at 332.5.CBG this am is255. 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 Both are improving.Maxillofacial CT shows poor dentition with numerous absent teeth and dental caries, andperiapical abscesses involving the residual left mandibular molar, mandibular incisors, and maxillary incisors. -Amoxicillin 500mg  TID 14 days(8/23-9/6) -Left message forDr. Enrique Sack with my call back number, appreciate recommendations - Patientreceived1st dose ofCOVID-19 vaccine 8/25. Recommended 2nd dose 9/15  Lipoma of R groin  Confirmed on ultrasound. Negative for hernia. Is not currently causing patient pain. - con't to monitor   UTI Urine culture positive for group B strep. Patient is asymptomatic  - con't with Amoxicillin as above.  - con't to monitor   Generalized Weakness  Patientreportedweakness on admission. PT and OT both  recommend SNF on d/c to maximize function prior to going home with daughter who works. Recommends rolling walker  Hyperlipidemia -atorvastatin80 mg daily  TOC consult Assisting with Medicaid and disability application - Follow-up   FEN/GI: carb modified diet  Prophylaxis: enoxaparin  Disposition:d/c to SNF as soon as available  Subjective:   No acute events overnight. Patient denies diarrhea.  Patient still concerned that it takes a while for the nurses to get to him when he presses the button to use the restroom.  He denies any other concerns or complaints.  Objective: Temp:  [97.7 F (36.5 C)-98.2 F (36.8 C)] 97.7 F (36.5 C) (08/29 0600) Pulse Rate:  [83-94] 83 (08/29 0600) Resp:  [14-22] 14 (08/29 0600) BP: (116)/(76-78) 116/78 (08/29 0600) SpO2:  [95 %-100 %] 95 % (08/29 0600) Weight:  [122.9 kg] 122.9 kg (08/29 0600) Physical Exam: General: well appearing, in no acute distress  Cardiovascular: RRR no murmurs appreciated  Respiratory: CTA bilaterally  Abdomen:soft, non distended Extremities: warm, dry. Distal pulses 2+ bilaterally. No edema   Laboratory: No results for input(s): WBC, HGB, HCT, PLT in the last 168 hours. Recent Labs  Lab 12/17/19 0636 12/18/19 0733 12/21/19 0437  NA 143 145 140  K 4.1 3.4* 3.3*  CL 107 107 104  CO2 21* 26 28  BUN 10 8 5*  CREATININE 0.94 0.94 0.94  CALCIUM 8.7* 9.0 8.5*  GLUCOSE 370* 281* 234*    Imaging/Diagnostic Tests:  No new imaging   Shary Key, DO 12/21/2019, 7:35 AM PGY-1, [place] Intern pager: [phone], text pages welcome

## 2019-12-22 DIAGNOSIS — R739 Hyperglycemia, unspecified: Secondary | ICD-10-CM

## 2019-12-22 LAB — BASIC METABOLIC PANEL
Anion gap: 10 (ref 5–15)
BUN: 5 mg/dL — ABNORMAL LOW (ref 8–23)
CO2: 29 mmol/L (ref 22–32)
Calcium: 9 mg/dL (ref 8.9–10.3)
Chloride: 106 mmol/L (ref 98–111)
Creatinine, Ser: 1 mg/dL (ref 0.61–1.24)
GFR calc Af Amer: >60 mL/min (ref >60)
GFR calc non Af Amer: >60 mL/min (ref >60)
Glucose, Bld: 277 mg/dL — ABNORMAL HIGH (ref 70–99)
Potassium: 4.2 mmol/L (ref 3.5–5.1)
Sodium: 145 mmol/L (ref 135–145)

## 2019-12-22 LAB — GLUCOSE, CAPILLARY
Glucose-Capillary: 274 mg/dL — ABNORMAL HIGH (ref 70–99)
Glucose-Capillary: 226 mg/dL — ABNORMAL HIGH (ref 70–99)
Glucose-Capillary: 298 mg/dL — ABNORMAL HIGH (ref 70–99)
Glucose-Capillary: 315 mg/dL — ABNORMAL HIGH (ref 70–99)

## 2019-12-22 MED ORDER — INSULIN GLARGINE 100 UNIT/ML ~~LOC~~ SOLN
30.0000 [IU] | Freq: Every day | SUBCUTANEOUS | Status: DC
  Administered 2019-12-22: 30 [IU] via SUBCUTANEOUS
  Filled 2019-12-22 (×2): qty 0.3

## 2019-12-22 NOTE — Progress Notes (Signed)
Inpatient Diabetes Program Recommendations  AACE/ADA: New Consensus Statement on Inpatient Glycemic Control (2015)  Target Ranges:  Prepandial:   less than 140 mg/dL      Peak postprandial:   less than 180 mg/dL (1-2 hours)      Critically ill patients:  140 - 180 mg/dL   Lab Results  Component Value Date   GLUCAP 274 (H) 12/22/2019   HGBA1C 13.2 (H) 12/13/2019    Review of Glycemic Control Results for MOISHY, LADAY (MRN [id number]) as of 12/22/2019 11:29  Ref. Range 12/21/2019 11:46 12/21/2019 16:41 12/21/2019 21:02 12/22/2019 06:00  Glucose-Capillary Latest Ref Range: 70 - 99 mg/dL 159 (H) 224 (H) 223 (H) 274 (H)   Diabetes history:DM 2 Outpatient Diabetes medications:None Current orders for Inpatient glycemic control:Lantus 30 units QHS, 50 units QD Novolog 0-15 units TID  Inpatient Diabetes Program Recommendations:    Noted changes to insulin. Of noted patient received Lantus 75 units x 1 in AM on 8/29 and an additional 20 units QPM (totalling 95 units). FSBG was still 274 mg/dL. Assuming patient will need more Lantus.   Consider  -Lantus 40 units QHS and continue with Lantus 50 units QD -Add Novolog 4 units TID (assuming patient is consuming >50% of meal).  Thanks, Bronson Curb, MSN, RNC-OB Diabetes Coordinator [phone] (8a-5p)

## 2019-12-22 NOTE — Progress Notes (Signed)
Physical Therapy Treatment Patient Details Name: Bill Torres MRN: [id number] DOB: Feb 16, 1956 Today's Date: 12/22/2019    History of Present Illness Bill Torres is a 63 y.o. male who presened with generalized weakness and hyperglycemia, likely DKA. pt with x2 weeks with no taste or smell with negative COVID test. PMH is significant for obesity and no prior medical care     PT Comments    Pt admitted with above diagnosis. Pt was able to ambulate with RW with min guard assist in room as pt refuses to go into hallway.  Made laps in room with good safety with RW.  Pt without LOB.  Progressing well overall.   Pt currently with functional limitations due to balance and endurance deficits. Pt will benefit from skilled PT to increase their independence and safety with mobility to allow discharge to the venue listed below.     Follow Up Recommendations  SNF;Supervision/Assistance - 24 hour (Depends on progress, daughter works per pt)     Clinical biochemist with 5" wheels;3in1 (PT)    Recommendations for Other Services       Precautions / Restrictions Precautions Precautions: Fall Restrictions Weight Bearing Restrictions: No    Mobility  Bed Mobility Overal bed mobility: Needs Assistance Bed Mobility: Rolling;Supine to Sit;Sit to Supine Rolling: Supervision Sidelying to sit: Supervision   Sit to supine: Supervision   General bed mobility comments: did not need assist to come to eOB or get back in bed but just incr time  Transfers Overall transfer level: Needs assistance Equipment used: Rolling walker (2 wheeled) Transfers: Sit to/from Stand;Stand Pivot Transfers Sit to Stand: From elevated surface;Supervision;Min guard         General transfer comment: Pt did not need assist to come to stand with raised bed.   Ambulation/Gait Ambulation/Gait assistance: Min guard Gait Distance (Feet): 90 Feet Assistive device: Rolling walker (2  wheeled) Gait Pattern/deviations: Step-through pattern;Decreased stride length   Gait velocity interpretation: <1.31 ft/sec, indicative of household ambulator General Gait Details: Pt steady with RW. Pt refuses to walk in hallway therefore made laps in room.     Stairs             Wheelchair Mobility    Modified Rankin (Stroke Patients Only)       Balance Overall balance assessment: Needs assistance Sitting-balance support: No upper extremity supported;Feet supported Sitting balance-Leahy Scale: Fair     Standing balance support: Bilateral upper extremity supported;During functional activity Standing balance-Leahy Scale: Poor Standing balance comment: relies on UE support.                             Cognition Arousal/Alertness: Awake/alert Behavior During Therapy: WFL for tasks assessed/performed Overall Cognitive Status: Impaired/Different from baseline Area of Impairment: Awareness                                      Exercises General Exercises - Lower Extremity Long Arc Quad: AROM;Both;10 reps;Seated Hip Flexion/Marching: AROM;Both;10 reps;Seated    General Comments        Pertinent Vitals/Pain Pain Assessment: No/denies pain    Home Living                      Prior Function            PT Goals (current goals can now  be found in the care plan section) Progress towards PT goals: Progressing toward goals    Frequency    Min 2X/week      PT Plan Current plan remains appropriate;Frequency needs to be updated    Co-evaluation              AM-PAC PT "6 Clicks" Mobility   Outcome Measure  Help needed turning from your back to your side while in a flat bed without using bedrails?: A Little Help needed moving from lying on your back to sitting on the side of a flat bed without using bedrails?: A Little Help needed moving to and from a bed to a chair (including a wheelchair)?: A Little Help needed  standing up from a chair using your arms (e.g., wheelchair or bedside chair)?: A Little Help needed to walk in hospital room?: A Little Help needed climbing 3-5 steps with a railing? : A Lot 6 Click Score: 17    End of Session Equipment Utilized During Treatment: Gait belt Activity Tolerance: Patient limited by fatigue Patient left: with call bell/phone within reach;in bed;with bed alarm set Nurse Communication: Mobility status PT Visit Diagnosis: Muscle weakness (generalized) (M62.81)     Time: 8592-9244 PT Time Calculation (min) (ACUTE ONLY): 23 min  Charges:  $Gait Training: 8-22 mins $Therapeutic Exercise: 8-22 mins                     Bill Torres,PT Acute Rehabilitation Services Pager:  [phone]  Office:  [place] 12/22/2019, 10:26 AM

## 2019-12-22 NOTE — Progress Notes (Signed)
Patient blood sugar 315 tonight.Patient scheduled to receive only 30 units of lantus insulin tonight no sliding scale coverage. Dr. Nancy Fetter notified. No new orders received.

## 2019-12-22 NOTE — Progress Notes (Signed)
Brief Nutrition Education Follow-Up  RD consulted for nutrition education regarding diabetes.   Pt with new diagnosis of DM this admission. RD has already educated pt; refer to note on 12/17/25. RD spent extensive time educating pt during that visit. Pt reports he has a daughter, who also has DM, who will help care for him after discharge.   Current diet order is carb modified, patient is consuming approximately 50-100% of meals at this time. Labs and medications reviewed. No further nutrition interventions warranted at this time. RD contact information provided. If additional nutrition issues arise, please re-consult RD.  Loistine Chance, RD, LDN, [place] Registered Dietitian II Certified Diabetes Care and Education Specialist Please refer to [facility] for RD and/or RD on-call/weekend/after hours pager

## 2019-12-22 NOTE — Progress Notes (Signed)
Family Medicine Teaching Service Daily Progress Note Intern Pager: [phone]  Patient name: Bill Torres Medical record number: [id number] Date of birth: 03-27-1956 Age: 64 y.o. Gender: male  Primary Care Provider: Patient, No Pcp Per Consultants: None Code Status: Full   Major Events:  8/21: admitted for DKA, placed on insulin gtt 8/22:Transitionedto subcutaneous insulin 8/25:ModernaCOVID vaccine dose 1  Assessment and Plan: Bill Torres is a 64 y.o. male who presented with generalized weakness and hyperglycemia, likely DKA. PMH is significant for obesity and no prior medical care.Patient has not been approved for SNF because of no insurance. Pending medicaid approval. CIR consult placed as well.   DKA  Newly diagnosed T2DM Patient transitioned from insulin drip to subcutaneous insulinon 8/22. 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 Both are improving.Maxillofacial CT shows poor dentition with numerous absent teeth and dental caries, andperiapical abscesses involving the residual left mandibular molar, mandibular incisors, and maxillary incisors. - Continue to monitor -Amoxicillin 500mg  TID 14 days -Left message forDr. Enrique Sack with my call back number, appreciate recommendations - Patientreceived1st dose ofCOVID-19 vaccine 8/25  Lipoma Patient complained of lower right sided groin pain yesterday, 8/26 that he thought to be due to a hernia. 4-5 cm somewhat well  circumcised mass palpated  Soft tissue ultrasound showed a lipoma. Patient reports today that it is not painful but just pulsates occasionally.   - will monitor   UTI Urine culture positive for group B strep. Patient is asymptomatic  - already treating patient with Amoxicillin. Will continue to monitor   Generalized Weakness  Patientreportedweakness on admission. Patient requires assistance with sitting upright in bed for pulmonary exam. Likely deconditioning from being sick with DKA for the last few days.  -PT/OT consults: PT and OT both recommend SNF on d/c to maximize function prior to going home with daughter who works. Recommends rolling walker  Hyperlipidemia Lipid panel on admission shows cholesterol 304, triglycerides 300, HDL low at 33, VLDL elevated at 60, and LDL elevated at 211.  -atorvastatin80 mg daily  TOC consult - assisting with Medicaid and disability application - pt not approved for SNF because he has not been approved for Medicaid insurance - CIR consult placed by TOC   FEN/GI: carb modified diet  Prophylaxis: enoxaparin  Disposition:Pending medicaid approval for SNF. Consult placed for CIR   Subjective:   No acute events overnight. Patient denies nausea, diarrhea today.  He states he is starting to get his smell and taste back.  I asked patient what he has been eating and drinking because his blood sugar has continued to be high, and he stated he has juices and sodas.  He stated he does not like the taste of diet soda.  I stressed the importance of limiting his sugar and drinking water instead of the juice and soda.  Objective: Temp:  [97.7 F (36.5 C)-98.1 F (36.7 C)] 98.1 F (36.7 C) (08/29 2101) Pulse Rate:  [83-92] 92 (08/29 2101) Resp:  [14-18] 18 (08/29 2101) BP: (113-119)/(77-83)  119/77 (08/29 2101) SpO2:  [95 %-100 %] 100 % (08/29 2101) Weight:  [122.9 kg] 122.9 kg (08/29 0600) Physical Exam: General: alert, sitting in bed   Cardiovascular: RRR. No murmurs appreciated Respiratory: CTA bilateraly Abdomen: soft, non distended, non tender  Extremities: warm, dry. No edema. Distal pulses 2+ bilaterally   Laboratory: No results for input(s): WBC, HGB, HCT, PLT in the last 168 hours. Recent Labs  Lab 12/17/19 0636 12/18/19 0733 12/21/19 0437  NA 143 145 140  K 4.1 3.4* 3.3*  CL 107 107 104  CO2 21* 26 28  BUN 10 8 5*  CREATININE 0.94 0.94 0.94  CALCIUM 8.7* 9.0 8.5*  GLUCOSE 370* 281* 234*    Imaging/Diagnostic Tests:  No new images   Shary Key, DO 12/22/2019, 5:46 AM PGY-1, [place] Intern pager: [phone], text pages welcome

## 2019-12-23 LAB — BASIC METABOLIC PANEL
Anion gap: 9 (ref 5–15)
BUN: 5 mg/dL — ABNORMAL LOW (ref 8–23)
CO2: 30 mmol/L (ref 22–32)
Calcium: 8.8 mg/dL — ABNORMAL LOW (ref 8.9–10.3)
Chloride: 104 mmol/L (ref 98–111)
Creatinine, Ser: 0.98 mg/dL (ref 0.61–1.24)
GFR calc Af Amer: >60 mL/min (ref >60)
GFR calc non Af Amer: >60 mL/min (ref >60)
Glucose, Bld: 260 mg/dL — ABNORMAL HIGH (ref 70–99)
Potassium: 3.6 mmol/L (ref 3.5–5.1)
Sodium: 143 mmol/L (ref 135–145)

## 2019-12-23 LAB — GLUCOSE, CAPILLARY
Glucose-Capillary: 246 mg/dL — ABNORMAL HIGH (ref 70–99)
Glucose-Capillary: 313 mg/dL — ABNORMAL HIGH (ref 70–99)

## 2019-12-23 MED ORDER — INSULIN GLARGINE 100 UNIT/ML SOLOSTAR PEN: 45.0000 [IU] | PEN_INJECTOR | Freq: Two times a day (BID) | SUBCUTANEOUS | 0 refills | Status: DC

## 2019-12-23 MED ORDER — PEN NEEDLES 31G X 8 MM MISC: 0 refills | Status: DC

## 2019-12-23 MED ORDER — CANAGLIFLOZIN 300 MG PO TABS
300.0000 mg | ORAL_TABLET | Freq: Every day | ORAL | 0 refills | Status: DC
Start: 2019-12-23 — End: 2019-12-23

## 2019-12-23 MED ORDER — AMOXICILLIN 500 MG PO CAPS: 500.0000 mg | ORAL_CAPSULE | Freq: Three times a day (TID) | ORAL | 0 refills | Status: DC

## 2019-12-23 MED ORDER — INSULIN GLARGINE 100 UNIT/ML ~~LOC~~ SOLN
40.0000 [IU] | Freq: Every day | SUBCUTANEOUS | Status: DC
  Filled 2019-12-23: qty 0.4

## 2019-12-23 MED ORDER — EMPAGLIFLOZIN 10 MG PO TABS
10.0000 mg | ORAL_TABLET | Freq: Every day | ORAL | 0 refills | Status: DC
Start: 2019-12-23 — End: 2020-01-07

## 2019-12-23 MED ORDER — EMPAGLIFLOZIN 10 MG PO TABS
10.0000 mg | ORAL_TABLET | Freq: Every day | ORAL | Status: DC
  Administered 2019-12-23: 10 mg via ORAL
  Filled 2019-12-23: qty 1

## 2019-12-23 MED ORDER — EMPAGLIFLOZIN 10 MG PO TABS: 10.0000 mg | ORAL_TABLET | Freq: Every day | ORAL | Status: DC

## 2019-12-23 MED ORDER — ATORVASTATIN CALCIUM 80 MG PO TABS
80.0000 mg | ORAL_TABLET | Freq: Every day | ORAL | 0 refills | Status: DC
Start: 2019-12-24 — End: 2020-01-07

## 2019-12-23 MED FILL — JARDIANCE 10 MG TABLET: 10 | 14 days supply | Qty: 14 | Fill #0

## 2019-12-23 MED FILL — AMOXICILLIN 500 MG CAPS: 500 | 6 days supply | Qty: 18 | Fill #0

## 2019-12-23 MED FILL — PENTIPS 31G X 8 MM MISC: 31G X 8 MM | 30 days supply | Qty: 100 | Fill #0

## 2019-12-23 MED FILL — LANTUS SOLOSTAR 100 UNITS/M: 100 | 16 days supply | Qty: 15 | Fill #0

## 2019-12-23 MED FILL — ATORVASTATIN CALCIUM 80 MG: 80 | 30 days supply | Qty: 30 | Fill #0

## 2019-12-23 NOTE — Progress Notes (Signed)
Patient has been able to give himself insulin injections this shift and verbalize sites for injection.

## 2019-12-23 NOTE — Progress Notes (Signed)
Occupational Therapy Treatment Patient Details Name: Bill Torres MRN: [id number] DOB: 1955/08/19 Today's Date: 12/23/2019    History of present illness Bill Torres is a 64 y.o. male who presened with generalized weakness and hyperglycemia, likely DKA. pt with x2 weeks with no taste or smell with negative COVID test. PMH is significant for obesity and no prior medical care    OT comments  Patient continues to make steady progress towards goals in skilled OT session. Patient's session encompassed functional ambulation and completion of ADLs at sink in order to increase overall activity tolerance in session. Pt now at min G level to complete ADLs in standing, with no need for seated rest breaks with VSS throughout. Pt also mod I for bed mobility however remains self limiting as pt declining to complete further exercises after ADLs. Per RN, pt was able to shower independently yesterday as well as is able to ambulate to the bathroom independently. Discharge has been updated to reflect progress; will continue to follow acutely.    Follow Up Recommendations  Home health OT;Supervision - Intermittent    Equipment Recommendations  Other (comment) (RW)    Recommendations for Other Services      Precautions / Restrictions Precautions Precautions: Fall Restrictions Weight Bearing Restrictions: No       Mobility Bed Mobility Overal bed mobility: Modified Independent Bed Mobility: Supine to Sit     Supine to sit: Modified independent (Device/Increase time)     General bed mobility comments: Increased time but able to complete with no issues  Transfers Overall transfer level: Needs assistance Equipment used: Rolling walker (2 wheeled)   Sit to Stand: From elevated surface;Supervision;Min guard         General transfer comment: Pt did not need assist to come to stand with raised bed.     Balance Overall balance assessment: Needs assistance Sitting-balance  support: No upper extremity supported;Feet supported Sitting balance-Leahy Scale: Fair     Standing balance support: Bilateral upper extremity supported;During functional activity Standing balance-Leahy Scale: Poor Standing balance comment: relies on UE support.                            ADL either performed or assessed with clinical judgement   ADL Overall ADL's : Needs assistance/impaired     Grooming: Wash/dry hands;Wash/dry face;Oral care;Brushing hair;Set up;Standing Grooming Details (indicate cue type and reason): Able to complete in standing with no elevated HR         Upper Body Dressing : Set up Upper Body Dressing Details (indicate cue type and reason): donning back gown                 Functional mobility during ADLs: Min guard;Cueing for safety;Cueing for sequencing;Rolling walker General ADL Comments: Pt continues to make progress towards functional mobility and independence     Vision       Perception     Praxis      Cognition Arousal/Alertness: Awake/alert   Overall Cognitive Status: Within Functional Limits for tasks assessed                                          Exercises     Shoulder Instructions       General Comments      Pertinent Vitals/ Pain       Pain Assessment:  No/denies pain  Home Living                                          Prior Functioning/Environment              Frequency  Min 2X/week        Progress Toward Goals  OT Goals(current goals can now be found in the care plan section)  Progress towards OT goals: Progressing toward goals  Acute Rehab OT Goals Patient Stated Goal: To get out of here OT Goal Formulation: With patient Time For Goal Achievement: 12/29/19 Potential to Achieve Goals: Good  Plan Discharge plan needs to be updated    Co-evaluation                 AM-PAC OT "6 Clicks" Daily Activity     Outcome Measure   Help from  another person eating meals?: None Help from another person taking care of personal grooming?: None Help from another person toileting, which includes using toliet, bedpan, or urinal?: None Help from another person bathing (including washing, rinsing, drying)?: A Little Help from another person to put on and taking off regular upper body clothing?: None Help from another person to put on and taking off regular lower body clothing?: A Little 6 Click Score: 22    End of Session Equipment Utilized During Treatment: Gait belt;Rolling walker  OT Visit Diagnosis: Unsteadiness on feet (R26.81);Muscle weakness (generalized) (M62.81)   Activity Tolerance Patient tolerated treatment well   Patient Left in chair;with call bell/phone within reach;with chair alarm set   Nurse Communication Mobility status;Precautions        Time: 0931-1216 OT Time Calculation (min): 25 min  Charges: OT General Charges $OT Visit: 1 Visit OT Treatments $Self Care/Home Management : 23-37 mins  [place]. [place] [facility], [place] Acute Rehabilitation Services [place] 12/23/2019, 11:47 AM

## 2019-12-23 NOTE — Discharge Summary (Addendum)
[facility] Discharge Summary  Patient name: Egbert Seidel Medical record number: [id number] Date of birth: 05/25/1955 Age: 64 y.o. Gender: male Date of Admission: 12/13/2019  Date of Discharge: 12/23/19 Admitting Physician: Blane Ohara McDiarmid, MD  Primary Care Provider: Patient, No Pcp Per Consultants: None  Indication for Hospitalization: Hyperglycemia   Discharge Diagnoses/Problem List:  Hyperglycemia Groin swelling Acute kidney injury Dental abscess Dysgeusia Anosmia New onset Type 2 Diabetes    Disposition: Home with PT and Home health   Discharge Condition: Stable  Discharge Exam:  Temp:  [98 F (36.7 C)-98.6 F (37 C)] 98.1 F (36.7 C) (08/31 1206) Pulse Rate:  [87] 87 (08/30 1951) Resp:  [14-20] 20 (08/31 0400) BP: (112-134)/(75-90) 121/90 (08/31 1206) SpO2:  [97 %-99 %] 97 % (08/31 0400) Weight:  [121.5 kg] 121.5 kg (08/31 0600) Physical Exam: General: alert, in no acute distress Cardiovascular: RRR no murmurs appreciated Respiratory: CTA in all fields bilaterally Abdomen: soft, non tender, non distended Extremities: warm, dry. No edema. Distal pulses 2+ bilaterally   Brief Hospital Course:   DKA  Newly diagnosed T2DM  Patient presented with tachycardia and hyperglycemia with glucose 566.  He had an EKG done which showed tachycardia without arrhythmia.  It was suspected to be newly diagnosed diabetes and DKA, with a beta hydroxybutyrate significantly elevated at greater than 8, UA showing greater than 500 glucose, 80 ketones, 100 protein, and no evidence of infection.  He had a mild leukocytosis with WBC 12.1. He was started on insulin drip per protocol in the ED.  Patient was transitioned to subcutaneous insulin on 8/22.  Anosmia  unvaccinated against Covid-19 Patient reported several days of loss of smell, taste.  Covid test was negative.  Chest x-ray showed some possible atelectasis, but was otherwise unremarkable.  He was  tachycardic, but otherwise stable and breathing comfortably on room air. Maxillofacial CT shows poor dentition with numerous absent teeth and dental caries, and periapical abscesses involving the residual left mandibular molar, mandibular incisors, and maxillary incisors. Patient treated with Amoxicillin 500mg  TID for 14 days  UTI Urine culture was positive for group B strep. Patient remained asymptomatic. Infection was treated with the Amoxicillin the patient was taking for his abscesses.    Lipoma of right groin Patient complained of lower right sided groin pain on 8/26 that was thought to be due to a hernia. A 4-5cm mass was palpated, and was confirmed to be a lipoma on soft tissue ultrasound. It did not seem to cause much pain to patient. Monitored the lipoma and patient can follow up outpatient   Generalized Weakness  Patient reported weakness on admission. He was likely deconditioned from being sick with DKA. He had PT and OT while hospitalized and both recommended SNF once discharged to maximize function prior to going home with daughter who works. They also recommended a rolling walker.   Hyperlipidemia  Patient's lipid panel on admission showed cholesterol 304, triglycerides 300, HDL low at 33, VLDL elevated at 60, and LDL elevated at 211. He was put on atorvastatin 80 mg daily and tolerated it well.   Issues for Follow Up:  Follow up with PCP regarding new diagnosis of DM2 Follow up with dentist regarding dental caries and abscesses  3.   Follow up on Medcaid application 4.   PT and Home health to be continued at home 5.   Follow up on lipoma outpatient  6.   Continue the medications listed below  Significant Procedures: None  Significant Labs and Imaging:  No results for input(s): WBC, HGB, HCT, PLT in the last 168 hours. Recent Labs  Lab 12/17/19 0636 12/17/19 0636 12/18/19 0733 12/18/19 0733 12/21/19 0437 12/21/19 0437 12/22/19 1025 12/23/19 0757  NA 143  --  145  --   140  --  145 143  K 4.1   < > 3.4*   < > 3.3*   < > 4.2 3.6  CL 107  --  107  --  104  --  106 104  CO2 21*  --  26  --  28  --  29 30  GLUCOSE 370*  --  281*  --  234*  --  277* 260*  BUN 10  --  8  --  5*  --  5* 5*  CREATININE 0.94  --  0.94  --  0.94  --  1.00 0.98  CALCIUM 8.7*  --  9.0  --  8.5*  --  9.0 8.8*   < > = values in this interval not displayed.     Results/Tests Pending at Time of Discharge: None   Discharge Medications:  Allergies as of 12/23/2019   No Known Allergies      Medication List     TAKE these medications    amoxicillin 500 MG capsule Commonly known as: AMOXIL Take 1 capsule (500 mg total) by mouth every 8 (eight) hours.   atorvastatin 80 MG tablet Commonly known as: LIPITOR Take 1 tablet (80 mg total) by mouth daily. Start taking on: December 24, 2019   empagliflozin 10 MG Tabs tablet Commonly known as: JARDIANCE Take 1 tablet (10 mg total) by mouth daily.   insulin glargine 100 UNIT/ML Solostar Pen Commonly known as: LANTUS Inject 45 Units into the skin 2 (two) times daily.   Pen Needles 31G X 8 MM Misc Use as directed with insulin pen               Durable Medical Equipment  (From admission, onward)           Start     Ordered   12/23/19 0000  For home use only DME 3 n 1        12/23/19 1311   12/22/19 0000  For home use only DME Walker rolling       Question Answer Comment  Walker: With [place] Wheels   Patient needs a walker to treat with the following condition Physical deconditioning   Patient needs a walker to treat with the following condition Impairment of balance   Patient needs a walker to treat with the following condition Impaired functional mobility, balance, and endurance      12/22/19 0847            Discharge Instructions: Please refer to Patient Instructions section of EMR for full details.  Patient was counseled important signs and symptoms that should prompt return to medical care, changes in  medications, dietary instructions, activity restrictions, and follow up appointments.   Follow-Up Appointments:  Follow-up Information     [facility] on 01/07/2020.   Why: @10 :50am Contact information: [place] [id number] [phone]        [facility] Follow up.   Why: rolling walker and 3 n 1 for charity Contact information: [address] [place] [address] [phone]         [facility] Follow  up.   Specialty: [place] Why: HHPT for charity Contact information: [place] [address] [phone]                 Shary Key, DO 12/23/2019, 2:58 PM PGY-1, [place]

## 2019-12-23 NOTE — Plan of Care (Signed)
  Problem: Education: Goal: Knowledge of General Education information will improve Description: Including pain rating scale, medication(s)/side effects and non-pharmacologic comfort measures Outcome: Adequate for Discharge   Problem: Health Behavior/Discharge Planning: Goal: Ability to manage health-related needs will improve Outcome: Adequate for Discharge   Problem: Clinical Measurements: Goal: Ability to maintain clinical measurements within normal limits will improve Outcome: Adequate for Discharge Goal: Will remain free from infection Outcome: Adequate for Discharge Goal: Diagnostic test results will improve Outcome: Adequate for Discharge Goal: Cardiovascular complication will be avoided Outcome: Adequate for Discharge   Problem: Activity: Goal: Risk for activity intolerance will decrease Outcome: Adequate for Discharge   Problem: Nutrition: Goal: Adequate nutrition will be maintained Outcome: Adequate for Discharge   Problem: Coping: Goal: Level of anxiety will decrease Outcome: Adequate for Discharge

## 2019-12-23 NOTE — Progress Notes (Signed)
Inpatient Diabetes Program Recommendations  AACE/ADA: New Consensus Statement on Inpatient Glycemic Control (2015)  Target Ranges:  Prepandial:   less than 140 mg/dL      Peak postprandial:   less than 180 mg/dL (1-2 hours)      Critically ill patients:  140 - 180 mg/dL   Lab Results  Component Value Date   GLUCAP 313 (H) 12/23/2019   HGBA1C 13.2 (H) 12/13/2019    Review of Glycemic Control Results for HRITHIK, BOSCHEE (MRN [id number]) as of 12/23/2019 11:37  Ref. Range 12/22/2019 16:45 12/22/2019 21:13 12/23/2019 06:02 12/23/2019 11:24  Glucose-Capillary Latest Ref Range: 70 - 99 mg/dL 298 (H) 315 (H) 246 (H) 313 (H)   Diabetes history:DM 2 Outpatient Diabetes medications:None Current orders for Inpatient glycemic control:Lantus 40 units QHS, 50 units QD Novolog 0-15 units TID  Inpatient Diabetes Program Recommendations:  Consider  -Adding Novolog 8 units TID (assuming patient is consuming >50% of meal).  Thanks, Bronson Curb, MSN, RNC-OB Diabetes Coordinator [phone] (8a-5p)

## 2019-12-23 NOTE — TOC Transition Note (Addendum)
Transition of Care [facility]) - CM/SW Discharge Note   Patient Details  Name: Bill Torres MRN: [id number] Date of Birth: October 01, 1955  [facility]) CM/SW Contact:  Zenon Mayo, RN Phone Number: 12/23/2019, 1:37 PM   Clinical Narrative:    Patient is for dc today, he will be going home with HHPT, rolling walker and 3 n 1.  NCM made referral to [facility] with [place] informed her this will need to be for charity, she will see if he qualifies, NCM made referral to [facility] with [place] for [place] for charity, she will check to see if patient qualifies for charity.  Patient states his daughter will pick him up around 3 pm today. Patient qualifies for charity per [place].    Final next level of care: [place] Barriers to Discharge: No Barriers Identified   Patient Goals and CMS Choice Patient states their goals for this hospitalization and ongoing recovery are:: get better      Discharge Placement                       Discharge Plan and Services                DME Arranged: 3-N-1, Walker rolling DME Agency: [facility]       HH Arranged: PT [place] Agency: [facility] Date [place]: 12/23/19 Time [place]: 2836 Representative spoke with at [place]: [place] ([place]) Interventions     Readmission Risk Interventions No flowsheet data found.

## 2019-12-23 NOTE — Progress Notes (Signed)
D/C instructions given and reviewed. No questions voiced, encouraged to call with any concerns. Tele and IV removed, tolerated well.

## 2019-12-23 NOTE — Progress Notes (Signed)
PT note Called by MD regarding pt wanting to go home instead of SNF.  THis PT feels that if pt has RW and 3N1 as well as HHPT he is safe to go home.  Discussed this with pt on last visit and pt was at that time still wanting therapy but has since changed his mind. D/C plan updated. Dewarren Ledbetter W,PT Acute Rehabilitation Services Pager:  [phone]  Office:  [phone]

## 2020-01-01 NOTE — Progress Notes (Signed)
Patient ID: Bill Torres, male   DOB: [birth year], 63 y.o.   MRN: [id number]     Bill Torres, is a 64 y.o. male  YYQ:[id number]  [id number]  DOB - 29-Jul-1955  Subjective:  Chief Complaint and HPI: Bill Torres is a 64 y.o. male here today to establish care and for a follow up visit After hospitalization 8/21-8/31/2021 for newly diagnosed diabetes with an A1C of 13.2.  He just ran out of jardiance.  He has been taking 45 units lantus bid and glucose has been 120-220.  No hypoglycemic episodes.  He is here with his daughter.  Mouth abscess is feeling better.  He completed antibiotics.      Discharge Diagnoses/Problem List:  Hyperglycemia Groin swelling Acute kidney injury Dental abscess Dysgeusia Anosmia New onset Type 2 Diabetes   From discharge summary:   Brief Hospital Course:   DKA  Newly diagnosed T2DM Patient presented with tachycardia and hyperglycemia with glucose 566.  He had an EKG done which showed tachycardia without arrhythmia.  It was suspected to be newly diagnosed diabetes and DKA, with a beta hydroxybutyrate significantly elevated at greater than 8, UA showing greater than 500 glucose, 80 ketones, 100 protein, and no evidence of infection.  He had a mild leukocytosis with WBC 12.1. He was started on insulin drip per protocol in the ED.  Patient was transitioned to subcutaneous insulin on 8/22.  Anosmiaunvaccinated against Covid-19 Patient reported several days of loss of smell, taste.  Covid test was negative.  Chest x-ray showed some possible atelectasis, but was otherwise unremarkable.  He was tachycardic, but otherwise stable and breathing comfortably on room air. Maxillofacial CT shows poor dentition with numerous absent teeth and dental caries, andperiapical abscesses involving the residual left mandibular molar, mandibular incisors, and maxillary incisors.Patient treated with Amoxicillin 500mg  TID for 14 days  UTI Urine culture was  positive for group B strep. Patient remained asymptomatic. Infection was treated with the Amoxicillin the patient was taking for his abscesses.    Lipoma of right groin Patient complained of lower right sided groin pain on 8/26 that was thought to be due to a hernia. A 4-5cm mass was palpated, and was confirmed to be a lipoma on soft tissue ultrasound. It did not seem to cause much pain to patient. Monitored the lipoma and patient can follow up outpatient   Generalized Weakness  Patientreportedweakness on admission. He was likely deconditioned from being sick with DKA. He had PT and OT while hospitalized and both recommended SNF once discharged to maximize function prior to going home with daughter who works. They also recommended a rolling walker.  Hyperlipidemia Patient's lipid panel on admission showed cholesterol 304, triglycerides 300, HDL low at 33, VLDL elevated at 60, and LDL elevated at 211. He was put on atorvastatin80 mg daily and tolerated it well.   Issues for Follow Up:  1. Follow up with PCP regarding new diagnosis of DM2 2. Follow up with dentist regarding dental caries and abscesses  3.   Follow up on Medcaid application 4.   PT and Home health to be continued at home 5.   Follow up on lipoma outpatient  6.   Continue the medications listed below   ED/Hospital notes reviewed.    ROS:   Constitutional:  No f/c, No night sweats, No unexplained weight loss. EENT:  No vision changes, No blurry vision, No hearing changes. No mouth, throat, or ear problems.  Respiratory: No cough, No SOB Cardiac: No CP, no  palpitations GI:  No abd pain, No N/V/D. GU: No Urinary s/sx Musculoskeletal: No joint pain Neuro: No headache, no dizziness, no motor weakness.  Skin: No rash Endocrine:  No polydipsia. No polyuria.  Psych: Denies SI/HI  No problems updated.  ALLERGIES: No Known Allergies  PAST MEDICAL HISTORY: Past Medical History:  Diagnosis Date  . Dental abscess  12/15/2019  . Hyperlipidemia associated with type 2 diabetes mellitus ([place]) 12/14/2019    MEDICATIONS AT HOME: Prior to Admission medications   Medication Sig Start Date End Date Taking? Authorizing Provider  atorvastatin (LIPITOR) 80 MG tablet Take 1 tablet (80 mg total) by mouth daily. 01/07/20   Argentina Donovan, PA-C  empagliflozin (JARDIANCE) 10 MG TABS tablet Take 1 tablet (10 mg total) by mouth daily. 01/07/20   Argentina Donovan, PA-C  insulin glargine (LANTUS) 100 UNIT/ML Solostar Pen Inject 45 Units into the skin 2 (two) times daily. 01/07/20   Argentina Donovan, PA-C  Insulin Pen Needle (PEN NEEDLES) 31G X 8 MM MISC Use as directed with insulin pen 01/07/20   Argentina Donovan, PA-C     Objective:  EXAM:   Vitals:   01/07/20 1053  BP: 118/80  Pulse: 100  Resp: 16  Temp: 98.8 F (37.1 C)  SpO2: 98%  Weight: 295 lb 12.8 oz (134.2 kg)  Height: 6\' 1"  (1.854 m)    General appearance : A&OX3. NAD. Non-toxic-appearing HEENT: Atraumatic and Normocephalic.  PERRLA. EOM intact.  Chest/Lungs:  Breathing-non-labored, Good air entry bilaterally, breath sounds normal without rales, rhonchi, or wheezing  CVS: S1 S2 regular, no murmurs, gallops, rubs  Extremities: Bilateral Lower Ext shows no edema, both legs are warm to touch with = pulse throughout Neurology:  CN II-XII grossly intact, Non focal.   Psych:  TP linear. J/I WNL. Normal speech. Appropriate eye contact and affect.  Skin:  No Rash  Data Review Lab Results  Component Value Date   HGBA1C 13.2 (H) 12/13/2019     Assessment & Plan   1. Type 2 diabetes mellitus with hyperglycemia, unspecified whether long term insulin use ([place]) Newly diagnosed and improving.  Continue current regimen and work on diet.  He has not had any hyoglycemic events.  - Glucose (CBG) - Microalbumin / creatinine urine ratio - empagliflozin (JARDIANCE) 10 MG TABS tablet; Take 1 tablet (10 mg total) by mouth daily.  Dispense: 30 tablet; Refill:  3 - insulin glargine (LANTUS) 100 UNIT/ML Solostar Pen; Inject 45 Units into the skin 2 (two) times daily.  Dispense: 15 mL; Refill: 3 - Insulin Pen Needle (PEN NEEDLES) 31G X 8 MM MISC; Use as directed with insulin pen  Dispense: 100 each; Refill: 3 - Comprehensive metabolic panel  2. Polycythemia - CBC with Differential/Platelet  3. Acute kidney injury ([place]) - Comprehensive metabolic panel  4. Hyperlipidemia associated with type 2 diabetes mellitus (HCC) - atorvastatin (LIPITOR) 80 MG tablet; Take 1 tablet (80 mg total) by mouth daily.  Dispense: 90 tablet; Refill: 0 - Comprehensive metabolic panel  5. Hospital discharge follow-up Doing well  Patient have been counseled extensively about nutrition and exercise  Return for [facility] in 3 weeks for DM and assign PCP in 2 months.  The patient was given clear instructions to go to ER or return to medical center if symptoms don't improve, worsen or new problems develop. The patient verbalized understanding. The patient was told to call to get lab results if they haven't heard anything in the next week.  Freeman Caldron, PA-C [facility] [place], [place]   01/07/2020, 11:17 AM

## 2020-01-07 ENCOUNTER — Encounter: Payer: Self-pay | Admitting: Physician Assistant

## 2020-01-07 ENCOUNTER — Other Ambulatory Visit: Payer: Self-pay | Admitting: Physician Assistant

## 2020-01-07 ENCOUNTER — Ambulatory Visit: Payer: MEDICAID | Attending: Physician Assistant | Admitting: Physician Assistant

## 2020-01-07 ENCOUNTER — Other Ambulatory Visit: Payer: Self-pay

## 2020-01-07 VITALS — BP 118/80 | HR 100 | Temp 98.8°F | Resp 16 | Ht 73.0 in | Wt 295.8 lb

## 2020-01-07 DIAGNOSIS — E1165 Type 2 diabetes mellitus with hyperglycemia: Secondary | ICD-10-CM

## 2020-01-07 DIAGNOSIS — N179 Acute kidney failure, unspecified: Secondary | ICD-10-CM | POA: Diagnosis not present

## 2020-01-07 DIAGNOSIS — D751 Secondary polycythemia: Secondary | ICD-10-CM | POA: Diagnosis not present

## 2020-01-07 DIAGNOSIS — E1169 Type 2 diabetes mellitus with other specified complication: Secondary | ICD-10-CM | POA: Diagnosis not present

## 2020-01-07 DIAGNOSIS — Z09 Encounter for follow-up examination after completed treatment for conditions other than malignant neoplasm: Secondary | ICD-10-CM

## 2020-01-07 DIAGNOSIS — E785 Hyperlipidemia, unspecified: Secondary | ICD-10-CM

## 2020-01-07 LAB — GLUCOSE, POCT (MANUAL RESULT ENTRY): POC Glucose: 211 mg/dl — AB (ref 70–99)

## 2020-01-07 MED ORDER — EMPAGLIFLOZIN 10 MG PO TABS: 10.0000 mg | ORAL_TABLET | Freq: Every day | ORAL | 3 refills | Status: DC

## 2020-01-07 MED ORDER — ATORVASTATIN CALCIUM 80 MG PO TABS: 80.0000 mg | ORAL_TABLET | Freq: Every day | ORAL | 0 refills | Status: DC

## 2020-01-07 MED ORDER — INSULIN GLARGINE 100 UNIT/ML SOLOSTAR PEN: 45.0000 [IU] | PEN_INJECTOR | Freq: Two times a day (BID) | SUBCUTANEOUS | 3 refills | Status: DC

## 2020-01-07 MED ORDER — PEN NEEDLES 31G X 8 MM MISC: 3 refills | Status: DC

## 2020-01-07 MED FILL — JARDIANCE 10 MG TABLET: 10 | 30 days supply | Qty: 30 | Fill #0

## 2020-01-07 MED FILL — BD PEN NDL SHORT 31GX5/16: 31G X 8 MM | 25 days supply | Qty: 100 | Fill #0

## 2020-01-07 MED FILL — LANTUS SOLOSTAR 100 UNITS/M: 100 | 16 days supply | Qty: 15 | Fill #0

## 2020-01-07 NOTE — Patient Instructions (Signed)
Eliminate sweets and white carbohydrates from your diet.  Check blood sugars fasting and at bedtime and record.  Drink 80-100 ounces water daily

## 2020-01-08 LAB — CBC WITH DIFFERENTIAL/PLATELET
Basophils Absolute: 0 10*3/uL (ref 0.0–0.2)
Basos: 1 %
EOS (ABSOLUTE): 0.3 10*3/uL (ref 0.0–0.4)
Eos: 4 %
Hematocrit: 44.1 % (ref 37.5–51.0)
Hemoglobin: 14.4 g/dL (ref 13.0–17.7)
Immature Grans (Abs): 0 10*3/uL (ref 0.0–0.1)
Immature Granulocytes: 0 %
Lymphocytes Absolute: 2.6 10*3/uL (ref 0.7–3.1)
Lymphs: 35 %
MCH: 29.4 pg (ref 26.6–33.0)
MCHC: 32.7 g/dL (ref 31.5–35.7)
MCV: 90 fL (ref 79–97)
Monocytes Absolute: 0.5 10*3/uL (ref 0.1–0.9)
Monocytes: 6 %
Neutrophils Absolute: 4.1 10*3/uL (ref 1.4–7.0)
Neutrophils: 54 %
Platelets: 235 10*3/uL (ref 150–450)
RBC: 4.9 x10E6/uL (ref 4.14–5.80)
RDW: 12.8 % (ref 11.6–15.4)
WBC: 7.5 10*3/uL (ref 3.4–10.8)

## 2020-01-08 LAB — COMPREHENSIVE METABOLIC PANEL
ALT: 41 IU/L (ref 0–44)
AST: 31 IU/L (ref 0–40)
Albumin/Globulin Ratio: 1.4 (ref 1.2–2.2)
Albumin: 4.1 g/dL (ref 3.8–4.8)
Alkaline Phosphatase: 144 IU/L — ABNORMAL HIGH (ref 44–121)
BUN/Creatinine Ratio: 5 — ABNORMAL LOW (ref 10–24)
BUN: 4 mg/dL — ABNORMAL LOW (ref 8–27)
Bilirubin Total: 0.7 mg/dL (ref 0.0–1.2)
CO2: 26 mmol/L (ref 20–29)
Calcium: 9 mg/dL (ref 8.6–10.2)
Chloride: 102 mmol/L (ref 96–106)
Creatinine, Ser: 0.74 mg/dL — ABNORMAL LOW (ref 0.76–1.27)
GFR calc Af Amer: 113 mL/min/{1.73_m2} (ref >59)
GFR calc non Af Amer: 97 mL/min/{1.73_m2} (ref >59)
Globulin, Total: 2.9 g/dL (ref 1.5–4.5)
Glucose: 183 mg/dL — ABNORMAL HIGH (ref 65–99)
Potassium: 4.4 mmol/L (ref 3.5–5.2)
Sodium: 141 mmol/L (ref 134–144)
Total Protein: 7 g/dL (ref 6.0–8.5)

## 2020-01-08 LAB — MICROALBUMIN / CREATININE URINE RATIO
Creatinine, Urine: 59.8 mg/dL
Microalb/Creat Ratio: <5 mg/g creat (ref 0–29)
Microalbumin, Urine: <3 ug/mL

## 2020-01-09 ENCOUNTER — Telehealth: Payer: Self-pay | Admitting: General Practice

## 2020-01-09 NOTE — Telephone Encounter (Signed)
Noted  

## 2020-01-09 NOTE — Telephone Encounter (Signed)
Deanna Hanchey calling from [facility] is calling to let Dr. Margarita Rana know that she will be faxing a plan of care over. CB- [phone] can leave VM

## 2020-01-16 ENCOUNTER — Ambulatory Visit: Payer: Self-pay

## 2020-01-20 MED FILL — LANTUS SOLOSTAR 100 UNITS/M: 100 | 16 days supply | Qty: 15 | Fill #1

## 2020-01-27 NOTE — Progress Notes (Signed)
S:     PCP: Freeman Caldron, PA PMH: T2DM, HLD, LVH, obesity  Patient newly diagnosed with Diabetes in August 2021.   Patient arrives in good spirits. Presents for diabetes evaluation, education, and management. Patient was referred and established care with Primary Care Provider on 01/07/20 after recent ED admission on 12/13/19 for hyperglycemia then diagnosed with diabetes. Found to have sugars > 500 and in DKA. Discharged on Jardiance 10 mg daily, Lantus 45 units BID and atorvastatin 80 mg daily. At PCP visit on 01/07/20, patient reported running out of Jardiance and has been taking Lantus 45 units BID with sugars 120-220s. No medication changes were made and patient encouraged to work on diet.  Today, patient reports medication adherence with Lantus 45 units BID and Jardiance 10 mg daily (AM). Reported home sugars in the 110-130s. Denies sugars <70 and >200. Reports nocturia and occasional haziness with eyes. Reports neuropathy in feet is getting better.  Family/Social History: -Fhx: no pertinent positives  -Tobacco use: denies -alcohol use: denies  -ilicit drugs: denies   Insurance coverage/medication affordability: self-pay  Medication adherence reported.   Current diabetes medications include: Jardiance 10 mg daily, Lantus 45 units BID Previously tried diabetes medications include: metformin (diarrhea) Current hypertension medications include: none Current hyperlipidemia medications include: atorvastatin 80 mg daily  Patient denies hypoglycemic events.  Patient reported dietary habits: Eats 3 meals/day Breakfast: cereal, tuna salad Lunch: tuna salad, sometimes skips Dinner: chicken breast, tater tots Snacks: fruits,  Drinks: bottled water  Patient-reported exercise habits: walks every other day around the block for an hour   Patient reports nocturia (nighttime urination).  Patient reports neuropathy (nerve pain). - getting better in feet Patient reports visual  changes.   O:  POCT: 116 (post-prandial)  Home blood sugars: 110-130s  Lab Results  Component Value Date   HGBA1C 13.2 (H) 12/13/2019   There were no vitals filed for this visit.  Lipid Panel     Component Value Date/Time   CHOL 304 (H) 12/13/2019 1647   TRIG 300 (H) 12/13/2019 1647   HDL 33 (L) 12/13/2019 1647   CHOLHDL 9.2 12/13/2019 1647   VLDL 60 (H) 12/13/2019 1647   LDLCALC 211 (H) 12/13/2019 1647    Clinical Atherosclerotic Cardiovascular Disease (ASCVD): No  The 10-year ASCVD risk score Mikey Bussing DC Jr., et al., 2013) is: 19.2%   Values used to calculate the score:     Age: 61 years     Sex: Male     Is Non-Hispanic African American: Yes     Diabetic: Yes     Tobacco smoker: No     Systolic Blood Pressure: 962 mmHg     Is BP treated: No     HDL Cholesterol: 33 mg/dL     Total Cholesterol: 304 mg/dL    A/P: Diabetes newly diagnosed currently uncontrolled with A1C of 13.2%, however sugars are improving averaging 110-130s. Will continue Lantus 45 units twice daily and Jardiance 10 mg daily. Counseled patient on appropriate hypoglycemia management plan. Encouraged patient to check sugars in the mornining prior to breakfast and insulin, and to check at least 1-2 hours after a meal and bring glucometer to next visit. Encouraged patient to aim for a diet full of vegetables, fruit and lean meats (chicken, [place], fish) and to limit carbs (bread, pasta, sugar, rice) and red meat consumption. Encouraged patient to exercise at least 20-30 minutes 5 times a week with the goal 150 minutes per week. Patient verbalized understanding. Patient feels  overwhelmed with new diabetes diagnosis as he has not had any significant health issues in the past. Congratulated patient on his improvement thus far with his sugars and counseled on the importance of medication adherence, diet, and exercise.  -Continued Lantus 45 units BID -Continued Jardiance 10 mg daily -Extensively discussed  pathophysiology of diabetes, recommended lifestyle interventions, dietary effects on blood sugar control -Counseled on s/sx of and management of hypoglycemia -Next A1C anticipated December 2021.   ASCVD risk - primary prevention in patient with diabetes. Last LDL is not controlled. ASCVD risk score is not >20%  - moderate or high intensity statin indicated. -Continued atorvastatin 80 mg daily -Recommend checking lipid panel in 3 months - December 2021  Written patient instructions provided.  Total time in face to face counseling 25 minutes.   Follow up PCP Clinic Visit in 6 weeks.   Lorel Monaco, PharmD PGY2 Ambulatory Care Resident [place]

## 2020-01-28 ENCOUNTER — Other Ambulatory Visit: Payer: Self-pay

## 2020-01-28 ENCOUNTER — Ambulatory Visit: Payer: Medicaid Other | Attending: Family Medicine | Admitting: Pharmacist

## 2020-01-28 DIAGNOSIS — E1165 Type 2 diabetes mellitus with hyperglycemia: Secondary | ICD-10-CM | POA: Insufficient documentation

## 2020-01-28 LAB — GLUCOSE, POCT (MANUAL RESULT ENTRY): POC Glucose: 116 mg/dl — AB (ref 70–99)

## 2020-01-28 NOTE — Patient Instructions (Addendum)
It was nice to see you today! Good Job managing your diabetes!  Your goal blood sugar is 80-130 before eating and less than 180 after eating.  In clinic, your blood sugar was 116 mg/dL.  Medication Changes:  Continue Lantus 45 units twice daily  Continue Jardiance 10 mg daily  Check sugars in the mornining prior to breakfast and insulin and another time at least 1-2 hours after a meal  Monitor blood sugars at home and keep a log (glucometer or piece of paper) to bring with you to your next visit.  Keep up the good work with diet and exercise. Aim for a diet full of vegetables, fruit and lean meats (chicken, [place], fish). Try to limit salt intake by eating fresh or frozen vegetables (instead of canned), rinse canned vegetables prior to cooking and do not add any additional salt to meals.

## 2020-01-29 ENCOUNTER — Encounter: Payer: Self-pay | Admitting: Pharmacist

## 2020-02-04 MED FILL — JARDIANCE 10 MG TABLET: 10 | 30 days supply | Qty: 30 | Fill #1

## 2020-02-04 MED FILL — LANTUS SOLOSTAR 100 UNITS/M: 100 | 16 days supply | Qty: 15 | Fill #2

## 2020-02-20 MED FILL — LANTUS SOLOSTAR 100 UNITS/M: 100 | 16 days supply | Qty: 15 | Fill #3

## 2020-03-05 ENCOUNTER — Other Ambulatory Visit: Payer: Self-pay | Admitting: Physician Assistant

## 2020-03-05 DIAGNOSIS — E1165 Type 2 diabetes mellitus with hyperglycemia: Secondary | ICD-10-CM

## 2020-03-05 MED FILL — LANTUS SOLOSTAR 100 UNITS/M: 100 | 16 days supply | Qty: 15 | Fill #0

## 2020-03-05 MED FILL — JARDIANCE 10 MG TABLET: 10 | 30 days supply | Qty: 30 | Fill #2

## 2020-03-07 ENCOUNTER — Telehealth: Payer: Self-pay | Admitting: Physician Assistant

## 2020-03-07 NOTE — Telephone Encounter (Signed)
Called to discuss the homebound Covid-19 vaccination initiative with the patient and/or caregiver.   Pt doesn't have a working phone number. Will try sending a myhcart message.   Angelena Form PA-C  [facility]

## 2020-03-09 ENCOUNTER — Other Ambulatory Visit: Payer: Self-pay

## 2020-03-09 ENCOUNTER — Ambulatory Visit: Payer: Medicaid Other | Attending: Family Medicine | Admitting: Family Medicine

## 2020-03-09 ENCOUNTER — Other Ambulatory Visit: Payer: Self-pay | Admitting: Family Medicine

## 2020-03-09 ENCOUNTER — Encounter: Payer: Self-pay | Admitting: Family Medicine

## 2020-03-09 VITALS — BP 122/82 | HR 98 | Ht 73.0 in | Wt 292.8 lb

## 2020-03-09 DIAGNOSIS — E1149 Type 2 diabetes mellitus with other diabetic neurological complication: Secondary | ICD-10-CM | POA: Diagnosis not present

## 2020-03-09 DIAGNOSIS — E785 Hyperlipidemia, unspecified: Secondary | ICD-10-CM

## 2020-03-09 DIAGNOSIS — E1169 Type 2 diabetes mellitus with other specified complication: Secondary | ICD-10-CM

## 2020-03-09 DIAGNOSIS — E1165 Type 2 diabetes mellitus with hyperglycemia: Secondary | ICD-10-CM | POA: Diagnosis not present

## 2020-03-09 LAB — GLUCOSE, POCT (MANUAL RESULT ENTRY): POC Glucose: 96 mg/dl (ref 70–99)

## 2020-03-09 LAB — POCT GLYCOSYLATED HEMOGLOBIN (HGB A1C): HbA1c, POC (controlled diabetic range): 6.5 % (ref 0.0–7.0)

## 2020-03-09 MED ORDER — LANTUS SOLOSTAR 100 UNIT/ML ~~LOC~~ SOPN: PEN_INJECTOR | SUBCUTANEOUS | 3 refills | Status: DC

## 2020-03-09 MED ORDER — GABAPENTIN 300 MG PO CAPS: 300.0000 mg | ORAL_CAPSULE | Freq: Every day | ORAL | 6 refills | Status: DC

## 2020-03-09 MED ORDER — EMPAGLIFLOZIN 10 MG PO TABS: 10.0000 mg | ORAL_TABLET | Freq: Every day | ORAL | 3 refills | Status: DC

## 2020-03-09 MED ORDER — ATORVASTATIN CALCIUM 80 MG PO TABS: 80.0000 mg | ORAL_TABLET | Freq: Every day | ORAL | 1 refills | Status: DC

## 2020-03-09 MED FILL — ATORVASTATIN CALCIUM 80 MG: 80 | 90 days supply | Qty: 90 | Fill #0

## 2020-03-09 MED FILL — GABAPENTIN 300 MG CAPSULE: 300 | 30 days supply | Qty: 30 | Fill #0

## 2020-03-09 NOTE — Patient Instructions (Signed)

## 2020-03-09 NOTE — Progress Notes (Signed)
Subjective:  Patient ID: Bill Torres, male    DOB: [birth year]  Age: 64 y.o. MRN: [id number]  CC: Establish Care and Diabetes   HPI Bill Torres is a 64 year old male with a history of obesity diagnosed with type 2 diabetes mellitus (A1c 6.5 diagnosed in 11/2019), hyperlipidemia who presents today to establish care.  A1c 6.5 down from 13.2. Denies hypoglycemic episodes and endorses compliance with Lantus and [place]ade however he complains it cost him $30 to obtain both medications and he has no medical coverage. Complains of numbness in his feet which has been present since hospitalization in 11/2019. Compliant with his statin and denies presence of myalgias.  He is trying to eat right and adhering to diabetic diet.  Seen by the clinical pharmacist last month for optimization of glycemic control.  Past Medical History:  Diagnosis Date   Dental abscess 12/15/2019   Hyperlipidemia associated with type 2 diabetes mellitus ([place]) 12/14/2019    No past surgical history on file.  No family history on file.  No Known Allergies  Outpatient Medications Prior to Visit  Medication Sig Dispense Refill   Insulin Pen Needle (PEN NEEDLES) 31G X 8 MM MISC Use as directed with insulin pen 100 each 3   atorvastatin (LIPITOR) 80 MG tablet Take 1 tablet (80 mg total) by mouth daily. 90 tablet 0   empagliflozin (JARDIANCE) 10 MG TABS tablet Take 1 tablet (10 mg total) by mouth daily. 30 tablet 3   LANTUS SOLOSTAR 100 UNIT/ML Solostar Pen INJECT 45 UNITS INTO THE SKIN 2 (TWO) TIMES DAILY. 15 mL 3   No facility-administered medications prior to visit.     ROS Review of Systems  Constitutional: Negative for activity change and appetite change.  HENT: Negative for sinus pressure and sore throat.   Eyes: Negative for visual disturbance.  Respiratory: Negative for cough, chest tightness and shortness of breath.   Cardiovascular: Negative for chest pain and leg swelling.    Gastrointestinal: Negative for abdominal distention, abdominal pain, constipation and diarrhea.  Endocrine: Negative.   Genitourinary: Negative for dysuria.  Musculoskeletal: Negative for joint swelling and myalgias.  Skin: Negative for rash.  Allergic/Immunologic: Negative.   Neurological: Negative for weakness, light-headedness and numbness.  Psychiatric/Behavioral: Negative for dysphoric mood and suicidal ideas.    Objective:  BP 122/82    Pulse 98    Ht 6\' 1"  (1.854 m)    Wt 292 lb 12.8 oz (132.8 kg)    SpO2 99%    BMI 38.63 kg/m   BP/Weight 03/09/2020 01/07/2020 12/20/9369  Systolic BP 696 789 381  Diastolic BP 82 80 90  Wt. (Lbs) 292.8 295.8 267.86  BMI 38.63 39.03 -      Physical Exam Constitutional:      Appearance: He is well-developed. He is obese.  Neck:     Vascular: No JVD.  Cardiovascular:     Rate and Rhythm: Normal rate.     Heart sounds: Normal heart sounds. No murmur heard.   Pulmonary:     Effort: Pulmonary effort is normal.     Breath sounds: Normal breath sounds. No wheezing or rales.  Chest:     Chest wall: No tenderness.  Abdominal:     General: Bowel sounds are normal. There is no distension.     Palpations: Abdomen is soft. There is no mass.     Tenderness: There is no abdominal tenderness.  Musculoskeletal:        General: Normal range of motion.  Right lower leg: No edema.     Left lower leg: No edema.  Neurological:     Mental Status: He is alert and oriented to person, place, and time.  Psychiatric:        Mood and Affect: Mood normal.     CMP Latest Ref Rng & Units 01/07/2020 12/23/2019 12/22/2019  Glucose 65 - 99 mg/dL 183(H) 260(H) 277(H)  BUN 8 - 27 mg/dL 4(L) 5(L) 5(L)  Creatinine 0.76 - 1.27 mg/dL 0.74(L) 0.98 1.00  Sodium 134 - 144 mmol/L 141 143 145  Potassium 3.5 - 5.2 mmol/L 4.4 3.6 4.2  Chloride 96 - 106 mmol/L 102 104 106  CO2 20 - 29 mmol/L 26 30 29   Calcium 8.6 - 10.2 mg/dL 9.0 8.8(L) 9.0  Total Protein 6.0 - 8.5  g/dL 7.0 - -  Total Bilirubin 0.0 - 1.2 mg/dL 0.7 - -  Alkaline Phos 44 - 121 IU/L 144(H) - -  AST 0 - 40 IU/L 31 - -  ALT 0 - 44 IU/L 41 - -    Lipid Panel     Component Value Date/Time   CHOL 304 (H) 12/13/2019 1647   TRIG 300 (H) 12/13/2019 1647   HDL 33 (L) 12/13/2019 1647   CHOLHDL 9.2 12/13/2019 1647   VLDL 60 (H) 12/13/2019 1647   LDLCALC 211 (H) 12/13/2019 1647    CBC    Component Value Date/Time   WBC 7.5 01/07/2020 1111   WBC 14.4 (H) 12/13/2019 1647   RBC 4.90 01/07/2020 1111   RBC 6.06 (H) 12/13/2019 1647   HGB 14.4 01/07/2020 1111   HCT 44.1 01/07/2020 1111   PLT 235 01/07/2020 1111   MCV 90 01/07/2020 1111   MCH 29.4 01/07/2020 1111   MCH 30.7 12/13/2019 1647   MCHC 32.7 01/07/2020 1111   MCHC 32.3 12/13/2019 1647   RDW 12.8 01/07/2020 1111   LYMPHSABS 2.6 01/07/2020 1111   MONOABS 1.0 12/13/2019 1647   EOSABS 0.3 01/07/2020 1111   BASOSABS 0.0 01/07/2020 1111    Lab Results  Component Value Date   HGBA1C 6.5 03/09/2020    Assessment & Plan:  1. Type 2 diabetes mellitus with hyperglycemia, unspecified whether long term insulin use (HCC) Commended on improvement with A1c of 6.5 down from 13.2 previously Counseled on management of hypoglycemia and he knows to reduce Lantus by 2 units if hypoglycemia occurs Counseled on Diabetic diet, my plate method, 035 minutes of moderate intensity exercise/week Blood sugar logs with fasting goals of 80-120 mg/dl, random of less than 180 and in the event of sugars less than 60 mg/dl or greater than 400 mg/dl encouraged to notify the clinic. Advised on the need for annual eye exams, annual foot exams, Pneumonia vaccine. - POCT glucose (manual entry) - POCT glycosylated hemoglobin (Hb A1C) - empagliflozin (JARDIANCE) 10 MG TABS tablet; Take 1 tablet (10 mg total) by mouth daily.  Dispense: 30 tablet; Refill: 3 - insulin glargine (LANTUS SOLOSTAR) 100 UNIT/ML Solostar Pen; INJECT 45 UNITS INTO THE SKIN 2 (TWO) TIMES  DAILY.  Dispense: 15 mL; Refill: 3  2. Hyperlipidemia associated with type 2 diabetes mellitus ([place]) Uncontrolled Lipid panel at next visit - atorvastatin (LIPITOR) 80 MG tablet; Take 1 tablet (80 mg total) by mouth daily.  Dispense: 90 tablet; Refill: 1  3. Other diabetic neurological complication associated with type 2 diabetes mellitus ([place]) Counseled on side effects of gabapentin - gabapentin (NEURONTIN) 300 MG capsule; Take 1 capsule (300 mg total) by mouth at  bedtime.  Dispense: 30 capsule; Refill: 6  Advised to obtain paperwork for the [place] discount at the front desk which will assist with his medications   Meds ordered this encounter  Medications   empagliflozin (JARDIANCE) 10 MG TABS tablet    Sig: Take 1 tablet (10 mg total) by mouth daily.    Dispense:  30 tablet    Refill:  3   insulin glargine (LANTUS SOLOSTAR) 100 UNIT/ML Solostar Pen    Sig: INJECT 45 UNITS INTO THE SKIN 2 (TWO) TIMES DAILY.    Dispense:  15 mL    Refill:  3   atorvastatin (LIPITOR) 80 MG tablet    Sig: Take 1 tablet (80 mg total) by mouth daily.    Dispense:  90 tablet    Refill:  1   gabapentin (NEURONTIN) 300 MG capsule    Sig: Take 1 capsule (300 mg total) by mouth at bedtime.    Dispense:  30 capsule    Refill:  6    Follow-up: Return in about 3 months (around 06/09/2020) for chronic disease management.       Charlott Rakes, MD, FAAFP. [facility] [place], [place]   03/09/2020, 5:06 PM

## 2020-03-24 ENCOUNTER — Telehealth: Payer: Self-pay | Admitting: Family Medicine

## 2020-03-24 MED FILL — LANTUS SOLOSTAR 100 UNITS/M: 100 | 16 days supply | Qty: 15 | Fill #1

## 2020-03-24 NOTE — Telephone Encounter (Signed)
Call placed to pt and message states that call can not be completed at this time.  Pt has refills on medication and pharmacy was informed to to fill medication and pt can pick up today.

## 2020-03-24 NOTE — Telephone Encounter (Signed)
Copied from [place] [phone]. Topic: General - Other >> Mar 23, 2020  1:19 PM Rainey Pines A wrote: Patient requesting callback from nurse as soon as possbile in regards his insulin medication. Patient reached out to pharmacy and pharmacy refused to fill stating patient needed an appt. Patient has already had an appt 11/16 and has one schedule for 02/22. Patient is out of insulin and needs medication as soon as possible. Please advise

## 2020-04-02 ENCOUNTER — Other Ambulatory Visit: Payer: Self-pay

## 2020-04-02 ENCOUNTER — Ambulatory Visit: Payer: Self-pay | Attending: Family Medicine

## 2020-04-02 MED FILL — LANTUS SOLOSTAR 100 UNITS/M: 100 | 16 days supply | Qty: 15 | Fill #2

## 2020-04-06 ENCOUNTER — Other Ambulatory Visit: Payer: Self-pay | Admitting: Family Medicine

## 2020-04-06 DIAGNOSIS — E1165 Type 2 diabetes mellitus with hyperglycemia: Secondary | ICD-10-CM

## 2020-04-06 MED ORDER — PEN NEEDLES 31G X 8 MM MISC: 3 refills | Status: DC

## 2020-04-06 NOTE — Telephone Encounter (Signed)
Medication Refill - Medication: insulin pen needles   Has the patient contacted their pharmacy? Yes.  Pt states that he is completely out. Please advise.  (Agent: If no, request that the patient contact the pharmacy for the refill.) (Agent: If yes, when and what did the pharmacy advise?)  Preferred Pharmacy (with phone number or street name):  [facility], [address]  [place] [address]  Phone: [phone] Fax: [phone]  Hours: Not open 24 hours     Agent: Please be advised that RX refills may take up to 3 business days. We ask that you follow-up with your pharmacy.

## 2020-04-08 MED FILL — JARDIANCE 10 MG TABLET: 10 | 30 days supply | Qty: 30 | Fill #3

## 2020-04-08 MED FILL — TRUEPLUS PEN NDL 31GX5/16: 31G X 8 MM | 25 days supply | Qty: 100 | Fill #1

## 2020-04-30 MED FILL — TRUEPLUS PEN NDL 31GX5/16: 31G X 8 MM | 25 days supply | Qty: 100 | Fill #1

## 2020-04-30 MED FILL — JARDIANCE 10 MG TABLET: 10 | 30 days supply | Qty: 30 | Fill #3

## 2020-04-30 MED FILL — LANTUS SOLOSTAR 100 UNITS/M: 100 | 16 days supply | Qty: 15 | Fill #3

## 2020-05-18 MED FILL — LANTUS SOLOSTAR 100 UNITS/M: 100 | 30 days supply | Qty: 30 | Fill #0

## 2020-05-31 ENCOUNTER — Telehealth: Payer: Self-pay

## 2020-05-31 ENCOUNTER — Telehealth: Payer: Self-pay | Admitting: Family Medicine

## 2020-05-31 MED FILL — JARDIANCE 10 MG TABLET: 10 | 30 days supply | Qty: 30 | Fill #0

## 2020-05-31 NOTE — Telephone Encounter (Signed)
PATIENT AWARE

## 2020-05-31 NOTE — Telephone Encounter (Signed)
JARDIANCE PA APPROVED UNTIL 04/23/21

## 2020-05-31 NOTE — Telephone Encounter (Signed)
Patient came into the clinic requesting to speak to Bill Torres about getting a legal aid. Please follow up.

## 2020-06-02 NOTE — Telephone Encounter (Signed)
Call placed at both numbers to follow up. No one answered home phone and there was no voice mail to leave a message. Male answered mobile, stating it was wrong number.

## 2020-06-08 ENCOUNTER — Encounter: Payer: Self-pay | Admitting: Family Medicine

## 2020-06-08 ENCOUNTER — Other Ambulatory Visit: Payer: Self-pay | Admitting: Family Medicine

## 2020-06-08 ENCOUNTER — Ambulatory Visit: Payer: Medicaid Other | Attending: Family Medicine | Admitting: Family Medicine

## 2020-06-08 ENCOUNTER — Other Ambulatory Visit: Payer: Self-pay

## 2020-06-08 VITALS — BP 120/81 | HR 84 | Ht 73.0 in | Wt 293.0 lb

## 2020-06-08 DIAGNOSIS — Z794 Long term (current) use of insulin: Secondary | ICD-10-CM

## 2020-06-08 DIAGNOSIS — E1169 Type 2 diabetes mellitus with other specified complication: Secondary | ICD-10-CM

## 2020-06-08 DIAGNOSIS — E1149 Type 2 diabetes mellitus with other diabetic neurological complication: Secondary | ICD-10-CM

## 2020-06-08 DIAGNOSIS — Z1211 Encounter for screening for malignant neoplasm of colon: Secondary | ICD-10-CM | POA: Diagnosis not present

## 2020-06-08 LAB — POCT GLYCOSYLATED HEMOGLOBIN (HGB A1C): HbA1c, POC (controlled diabetic range): 6 % (ref 0.0–7.0)

## 2020-06-08 LAB — GLUCOSE, POCT (MANUAL RESULT ENTRY): POC Glucose: 91 mg/dl (ref 70–99)

## 2020-06-08 MED ORDER — LANTUS SOLOSTAR 100 UNIT/ML ~~LOC~~ SOPN: 42.0000 [IU] | PEN_INJECTOR | Freq: Two times a day (BID) | SUBCUTANEOUS | 3 refills | Status: DC

## 2020-06-08 MED ORDER — GABAPENTIN 300 MG PO CAPS: 300.0000 mg | ORAL_CAPSULE | Freq: Every day | ORAL | 6 refills | Status: DC

## 2020-06-08 MED FILL — GABAPENTIN 300 MG CAPSULE: 300 | 30 days supply | Qty: 30 | Fill #0

## 2020-06-08 NOTE — Progress Notes (Signed)
Would like to discuss disability. Will be 65 this year.

## 2020-06-08 NOTE — Patient Instructions (Signed)
Diabetes Mellitus Basics  Diabetes mellitus, or diabetes, is a long-term (chronic) disease. It occurs when the body does not properly use sugar (glucose) that is released from food after you eat. Diabetes mellitus may be caused by one or both of these problems:  Your pancreas does not make enough of a hormone called insulin.  Your body does not react in a normal way to the insulin that it makes. Insulin lets glucose enter cells in your body. This gives you energy. If you have diabetes, glucose cannot get into cells. This causes high blood glucose (hyperglycemia). How to treat and manage diabetes You may need to take insulin or other diabetes medicines daily to keep your glucose in balance. If you are prescribed insulin, you will learn how to give yourself insulin by injection. You may need to adjust the amount of insulin you take based on the foods that you eat. You will need to check your blood glucose levels using a glucose monitor as told by your health care provider. The readings can help determine if you have low or high blood glucose. Generally, you should have these blood glucose levels:  Before meals (preprandial): 80-130 mg/dL (4.4-7.2 mmol/L).  After meals (postprandial): below 180 mg/dL (10 mmol/L).  Hemoglobin A1c (HbA1c) level: less than 7%. Your health care provider will set treatment goals for you. Keep all follow-up visits. This is important. Follow these instructions at home: Diabetes medicines Take your diabetes medicines every day as told by your health care provider. List your diabetes medicines here:  Name of medicine: ______________________________ ? Amount (dose): _______________ Time (a.m./p.m.): _______________ Notes: ___________________________________  Name of medicine: ______________________________ ? Amount (dose): _______________ Time (a.m./p.m.): _______________ Notes: ___________________________________  Name of medicine:  ______________________________ ? Amount (dose): _______________ Time (a.m./p.m.): _______________ Notes: ___________________________________ Insulin If you use insulin, list the types of insulin you use here:  Insulin type: ______________________________ ? Amount (dose): _______________ Time (a.m./p.m.): _______________Notes: ___________________________________  Insulin type: ______________________________ ? Amount (dose): _______________ Time (a.m./p.m.): _______________ Notes: ___________________________________  Insulin type: ______________________________ ? Amount (dose): _______________ Time (a.m./p.m.): _______________ Notes: ___________________________________  Insulin type: ______________________________ ? Amount (dose): _______________ Time (a.m./p.m.): _______________ Notes: ___________________________________  Insulin type: ______________________________ ? Amount (dose): _______________ Time (a.m./p.m.): _______________ Notes: ___________________________________ Managing blood glucose Check your blood glucose levels using a glucose monitor as told by your health care provider. Write down the times that you check your glucose levels here:  Time: _______________ Notes: ___________________________________  Time: _______________ Notes: ___________________________________  Time: _______________ Notes: ___________________________________  Time: _______________ Notes: ___________________________________  Time: _______________ Notes: ___________________________________  Time: _______________ Notes: ___________________________________   Low blood glucose Low blood glucose (hypoglycemia) is when glucose is at or below 70 mg/dL (3.9 mmol/L). Symptoms may include:  Feeling: ? Hungry. ? Sweaty and clammy. ? Irritable or easily upset. ? Dizzy. ? Sleepy.  Having: ? A fast heartbeat. ? A headache. ? A change in your vision. ? Numbness around the mouth, lips, or  tongue.  Having trouble with: ? Moving (coordination). ? Sleeping. Treating low blood glucose To treat low blood glucose, eat or drink something containing sugar right away. If you can think clearly and swallow safely, follow the 15:15 rule:  Take 15 grams of a fast-acting carb (carbohydrate), as told by your health care provider.  Some fast-acting carbs are: ? Glucose tablets: take 3-4 tablets. ? Hard candy: eat 3-5 pieces. ? Fruit juice: drink 4 oz (120 mL). ? Regular (not diet) soda: drink 4-6 oz (120-180 mL). ? Honey or sugar:   eat 1 Tbsp (15 mL).  Check your blood glucose levels 15 minutes after you take the carb.  If your glucose is still at or below 70 mg/dL (3.9 mmol/L), take 15 grams of a carb again.  If your glucose does not go above 70 mg/dL (3.9 mmol/L) after 3 tries, get help right away.  After your glucose goes back to normal, eat a meal or a snack within 1 hour. Treating very low blood glucose If your glucose is at or below 54 mg/dL (3 mmol/L), you have very low blood glucose (severe hypoglycemia). This is an emergency. Do not wait to see if the symptoms will go away. Get medical help right away. Call your local emergency services (911 in the [place] Do not drive yourself to the hospital. Questions to ask your health care provider  Should I talk with a diabetes educator?  What equipment will I need to care for myself at home?  What diabetes medicines do I need? When should I take them?  How often do I need to check my blood glucose levels?  What number can I call if I have questions?  When is my follow-up visit?  Where can I find a support group for people with diabetes? Where to find more information  American Diabetes Association: www.diabetes.org  Association of Diabetes Care and Education Specialists: www.diabeteseducator.org Contact a health care provider if:  Your blood glucose is at or above 240 mg/dL (13.3 mmol/L) for 2 days in a row.  You have  been sick or have had a fever for 2 days or more, and you are not getting better.  You have any of these problems for more than 6 hours: ? You cannot eat or drink. ? You feel nauseous. ? You vomit. ? You have diarrhea. Get help right away if:  Your blood glucose is lower than 54 mg/dL (3 mmol/L).  You get confused.  You have trouble thinking clearly.  You have trouble breathing. These symptoms may represent a serious problem that is an emergency. Do not wait to see if the symptoms will go away. Get medical help right away. Call your local emergency services (911 in the [place] Do not drive yourself to the hospital. Summary  Diabetes mellitus is a chronic disease that occurs when the body does not properly use sugar (glucose) that is released from food after you eat.  Take insulin and diabetes medicines as told.  Check your blood glucose every day, as often as told.  Keep all follow-up visits. This is important. This information is not intended to replace advice given to you by your health care provider. Make sure you discuss any questions you have with your health care provider. Document Revised: 08/12/2019 Document Reviewed: 08/12/2019 Elsevier Patient Education  2021 Elsevier Inc.  

## 2020-06-08 NOTE — Progress Notes (Signed)
Subjective:  Patient ID: Bill Torres, male    DOB: 1955-11-29  Age: 65 y.o. MRN: [id number]  CC: Diabetes   HPI Bill Torres  is a 65 year old male with a history of obesity diagnosed with type 2 diabetes mellitus (A1c 6.0 diagnosed in 11/2019), hyperlipidemia who presents today   He has no hypoglycemia and fasting blood sugars are never less than 90.  He does have Neuropathy and never received gabapentin from the pharmacy even though it was prescribed at his last visit 3 months ago.  Numbness occurs in his skin on his feet and he has to rub his feet together for relief His eyes have a haze over them and he finds himself rubbing his eyes constantly to clear his vision.  He has not been seen by an ophthalmologist recently.  He would like to apply to apply for disability to get him some income as he would like to get out of his daughter's house.  His daughter currently has bedbugs in her house and he has no one else to help him.  He states he will be turning 65 soon and does not need to go back to work hence his need for disability. Past Medical History:  Diagnosis Date  . Dental abscess 12/15/2019  . Hyperlipidemia associated with type 2 diabetes mellitus ([place]) 12/14/2019    History reviewed. No pertinent surgical history.  History reviewed. No pertinent family history.  No Known Allergies  Outpatient Medications Prior to Visit  Medication Sig Dispense Refill  . atorvastatin (LIPITOR) 80 MG tablet Take 1 tablet (80 mg total) by mouth daily. 90 tablet 1  . empagliflozin (JARDIANCE) 10 MG TABS tablet Take 1 tablet (10 mg total) by mouth daily. 30 tablet 3  . Insulin Pen Needle (PEN NEEDLES) 31G X 8 MM MISC Use as directed with insulin pen 100 each 3  . gabapentin (NEURONTIN) 300 MG capsule Take 1 capsule (300 mg total) by mouth at bedtime. 30 capsule 6  . insulin glargine (LANTUS SOLOSTAR) 100 UNIT/ML Solostar Pen INJECT 45 UNITS INTO THE SKIN 2 (TWO) TIMES DAILY. 15 mL 3    No facility-administered medications prior to visit.     ROS Review of Systems  Constitutional: Negative for activity change and appetite change.  HENT: Negative for sinus pressure and sore throat.   Eyes: Negative for visual disturbance.  Respiratory: Negative for cough, chest tightness and shortness of breath.   Cardiovascular: Negative for chest pain and leg swelling.  Gastrointestinal: Negative for abdominal distention, abdominal pain, constipation and diarrhea.  Endocrine: Negative.   Genitourinary: Negative for dysuria.  Musculoskeletal: Negative for joint swelling and myalgias.  Skin: Negative for rash.  Allergic/Immunologic: Negative.   Neurological: Positive for numbness. Negative for weakness and light-headedness.  Psychiatric/Behavioral: Negative for dysphoric mood and suicidal ideas.    Objective:  BP 120/81   Pulse 84   Ht _0  (1.854 m)   Wt 293 lb (132.9 kg)   SpO2 98%   BMI 38.66 kg/m   BP/Weight 06/08/2020 03/09/2020 07/31/8117  Systolic BP 147 829 562  Diastolic BP 81 82 80  Wt. (Lbs) 293 292.8 295.8  BMI 38.66 38.63 39.03      Physical Exam Constitutional:      Appearance: He is well-developed.  Neck:     Vascular: No JVD.  Cardiovascular:     Rate and Rhythm: Normal rate.     Heart sounds: Normal heart sounds. No murmur heard.   Pulmonary:  Effort: Pulmonary effort is normal.     Breath sounds: Normal breath sounds. No wheezing or rales.  Chest:     Chest wall: No tenderness.  Abdominal:     General: Bowel sounds are normal. There is no distension.     Palpations: Abdomen is soft. There is no mass.     Tenderness: There is no abdominal tenderness.  Musculoskeletal:        General: Normal range of motion.     Right lower leg: No edema.     Left lower leg: No edema.  Neurological:     Mental Status: He is alert and oriented to person, place, and time.  Psychiatric:        Mood and Affect: Mood normal.      Diabetic Foot  Exam - Simple   Simple Foot Form Visual Inspection See comments: Yes Sensation Testing See comments: Yes Pulse Check Posterior Tibialis and Dorsalis pulse intact bilaterally: Yes Comments Thick toenails and big toes bilaterally.  No ulcerations or skin breakdown Decreased monofilament testing in both feet     CMP Latest Ref Rng & Units 01/07/2020 12/23/2019 12/22/2019  Glucose 65 - 99 mg/dL 183(H) 260(H) 277(H)  BUN 8 - 27 mg/dL 4(L) 5(L) 5(L)  Creatinine 0.76 - 1.27 mg/dL 0.74(L) 0.98 1.00  Sodium 134 - 144 mmol/L 141 143 145  Potassium 3.5 - 5.2 mmol/L 4.4 3.6 4.2  Chloride 96 - 106 mmol/L 102 104 106  CO2 20 - 29 mmol/L _0 Calcium 8.6 - 10.2 mg/dL 9.0 8.8(L) 9.0  Total Protein 6.0 - 8.5 g/dL 7.0 - -  Total Bilirubin 0.0 - 1.2 mg/dL 0.7 - -  Alkaline Phos 44 - 121 IU/L 144(H) - -  AST 0 - 40 IU/L 31 - -  ALT 0 - 44 IU/L 41 - -    Lipid Panel     Component Value Date/Time   CHOL 304 (H) 12/13/2019 1647   TRIG 300 (H) 12/13/2019 1647   HDL 33 (L) 12/13/2019 1647   CHOLHDL 9.2 12/13/2019 1647   VLDL 60 (H) 12/13/2019 1647   LDLCALC 211 (H) 12/13/2019 1647    CBC    Component Value Date/Time   WBC 7.5 01/07/2020 1111   WBC 14.4 (H) 12/13/2019 1647   RBC 4.90 01/07/2020 1111   RBC 6.06 (H) 12/13/2019 1647   HGB 14.4 01/07/2020 1111   HCT 44.1 01/07/2020 1111   PLT 235 01/07/2020 1111   MCV 90 01/07/2020 1111   MCH 29.4 01/07/2020 1111   MCH 30.7 12/13/2019 1647   MCHC 32.7 01/07/2020 1111   MCHC 32.3 12/13/2019 1647   RDW 12.8 01/07/2020 1111   LYMPHSABS 2.6 01/07/2020 1111   MONOABS 1.0 12/13/2019 1647   EOSABS 0.3 01/07/2020 1111   BASOSABS 0.0 01/07/2020 1111    Lab Results  Component Value Date   HGBA1C 6.0 06/08/2020    Assessment & Plan:   1. Type 2 diabetes mellitus with other specified complication, with long-term current use of insulin (HCC) Controlled with A1c of 6.0 Decreased dose of Lantus to prevent hypoglycemia Discussed  management of hypoglycemia and he knows to notify the clinic if this occurs and to decrease Lantus by 2 units twice daily Counseled on Diabetic diet, my plate method, 917 minutes of moderate intensity exercise/week Blood sugar logs with fasting goals of 80-120 mg/dl, random of less than 180 and in the event of sugars less than 60 mg/dl or greater than 400 mg/dl  encouraged to notify the clinic. Advised on the need for annual eye exams, annual foot exams, Pneumonia vaccine. - POCT glucose (manual entry) - POCT glycosylated hemoglobin (Hb A1C) - Ambulatory referral to Ophthalmology - CMP14+EGFR; Future - Lipid panel; Future - insulin glargine (LANTUS SOLOSTAR) 100 UNIT/ML Solostar Pen; Inject 42 Units into the skin 2 (two) times daily.  Dispense: 30 mL; Refill: 3  2. Screening for colon cancer - Ambulatory referral to Gastroenterology  3. Other diabetic neurological complication associated with type 2 diabetes mellitus ([place]) Uncontrolled He never picked up gabapentin from the pharmacy I have sent prescription again We will place him on the LCSW schedule to discuss his request for disability and assistance through legal aid. - gabapentin (NEURONTIN) 300 MG capsule; Take 1 capsule (300 mg total) by mouth at bedtime.  Dispense: 30 capsule; Refill: 6    Meds ordered this encounter  Medications  . DISCONTD: insulin glargine (LANTUS SOLOSTAR) 100 UNIT/ML Solostar Pen    Sig: Inject 42 Units into the skin 2 (two) times daily. INJECT 45 UNITS INTO THE SKIN 2 (TWO) TIMES DAILY.    Dispense:  30 mL    Refill:  3    Dose decrease  . gabapentin (NEURONTIN) 300 MG capsule    Sig: Take 1 capsule (300 mg total) by mouth at bedtime.    Dispense:  30 capsule    Refill:  6  . insulin glargine (LANTUS SOLOSTAR) 100 UNIT/ML Solostar Pen    Sig: Inject 42 Units into the skin 2 (two) times daily.    Dispense:  30 mL    Refill:  3    Dose decrease    Follow-up: Return for disability - place on  Jasmine's schedule; PCP 3 months.       Charlott Rakes, MD, FAAFP. [facility] [place], [place]   06/08/2020, 5:35 PM

## 2020-06-15 ENCOUNTER — Other Ambulatory Visit: Payer: Self-pay

## 2020-06-15 ENCOUNTER — Ambulatory Visit: Payer: Medicaid Other

## 2020-06-16 ENCOUNTER — Ambulatory Visit: Payer: MEDICAID | Attending: Family Medicine | Admitting: Licensed Clinical Social Worker

## 2020-06-16 ENCOUNTER — Ambulatory Visit: Payer: MEDICAID

## 2020-06-16 DIAGNOSIS — Z599 Problem related to housing and economic circumstances, unspecified: Secondary | ICD-10-CM

## 2020-06-16 MED FILL — LANTUS SOLOSTAR 100 UNITS/M: 100 | 35 days supply | Qty: 30 | Fill #0

## 2020-06-16 MED FILL — TRUEPLUS PEN NDL 31GX5/16: 31G X 8 MM | 25 days supply | Qty: 100 | Fill #2

## 2020-06-17 LAB — CMP14+EGFR
ALT: 21 IU/L (ref 0–44)
AST: 21 IU/L (ref 0–40)
Albumin/Globulin Ratio: 1.3 (ref 1.2–2.2)
Albumin: 4.6 g/dL (ref 3.8–4.8)
Alkaline Phosphatase: 131 IU/L — ABNORMAL HIGH (ref 44–121)
BUN/Creatinine Ratio: 14 (ref 10–24)
BUN: 13 mg/dL (ref 8–27)
Bilirubin Total: 0.6 mg/dL (ref 0.0–1.2)
CO2: 22 mmol/L (ref 20–29)
Calcium: 9.8 mg/dL (ref 8.6–10.2)
Chloride: 101 mmol/L (ref 96–106)
Creatinine, Ser: 0.95 mg/dL (ref 0.76–1.27)
GFR calc Af Amer: 97 mL/min/{1.73_m2} (ref >59)
GFR calc non Af Amer: 84 mL/min/{1.73_m2} (ref >59)
Globulin, Total: 3.6 g/dL (ref 1.5–4.5)
Glucose: 91 mg/dL (ref 65–99)
Potassium: 4.5 mmol/L (ref 3.5–5.2)
Sodium: 142 mmol/L (ref 134–144)
Total Protein: 8.2 g/dL (ref 6.0–8.5)

## 2020-06-17 LAB — LIPID PANEL
Chol/HDL Ratio: 7.8 ratio — ABNORMAL HIGH (ref 0.0–5.0)
Cholesterol, Total: 241 mg/dL — ABNORMAL HIGH (ref 100–199)
HDL: 31 mg/dL — ABNORMAL LOW (ref >39)
LDL Chol Calc (NIH): 147 mg/dL — ABNORMAL HIGH (ref 0–99)
Triglycerides: 337 mg/dL — ABNORMAL HIGH (ref 0–149)
VLDL Cholesterol Cal: 63 mg/dL — ABNORMAL HIGH (ref 5–40)

## 2020-06-18 ENCOUNTER — Telehealth: Payer: Self-pay

## 2020-06-18 NOTE — Telephone Encounter (Signed)
Patient was called and a voicemail was left informing patient to return phone call for lab results. 

## 2020-06-18 NOTE — Telephone Encounter (Signed)
-----   Message from Charlott Rakes, MD sent at 06/17/2020  4:15 PM EST ----- Cholesterol is elevated and he is on Lipitor 80mg . Please verify if he has been compliant with it because if he has been compliant then I will need to switch to Crestor. Low cholesterol diet and exercise is important.

## 2020-06-21 NOTE — BH Specialist Note (Signed)
LCSW met with patient. LCSW introduced self and explained role at [facility]. Pt was informed that [facility] referral was completed to assist with resource needs.   Pt shared that he is experiencing stress residing with adult daughter due to conditions of the home. Pt is strongly interested in obtaining independent housing; however, he reports financial strain. Pt is interested in applying for disability as he feels he is unable to work due to vision concerns and ongoing neuropathy.   LCSW provided patient with information on how to apply for disability and discussed benefits of working with Legal Aid, should pt's disability application is denied. A Legal Aid referral was completed and submitted via e-mail.

## 2020-06-23 ENCOUNTER — Telehealth: Payer: Self-pay

## 2020-06-23 MED ORDER — ROSUVASTATIN CALCIUM 40 MG PO TABS: 40.0000 mg | ORAL_TABLET | Freq: Every day | ORAL | 1 refills | Status: DC

## 2020-06-23 NOTE — Telephone Encounter (Signed)
Pt states he has been taking Lipitor 100mg  BID. Med profile notes 80mg . Pt reiterated he takes 100mg  twice a day. States he is agreeable to taking Crestor. Asking if he sould finish out "The two new boxes Of Liptior I just got."

## 2020-06-23 NOTE — Telephone Encounter (Signed)
Pt was called and VM was left informing pt to return phone call.

## 2020-06-23 NOTE — Telephone Encounter (Signed)
I would like him to switch to Crestor 40 mg as it is more effective than the atorvastatin.

## 2020-06-24 ENCOUNTER — Other Ambulatory Visit: Payer: Self-pay | Admitting: Family Medicine

## 2020-06-24 ENCOUNTER — Other Ambulatory Visit: Payer: Self-pay

## 2020-06-24 MED ORDER — ROSUVASTATIN CALCIUM 40 MG PO TABS
40.0000 mg | ORAL_TABLET | Freq: Every day | ORAL | 1 refills | Status: DC
Start: 2020-06-24 — End: 2020-06-24

## 2020-06-24 MED FILL — ROSUVASTATIN CALCIUM 40 MG: 40 | 90 days supply | Qty: 90 | Fill #0

## 2020-06-24 NOTE — Telephone Encounter (Signed)
Pt was called and informed of medication being changed.

## 2020-06-24 NOTE — Telephone Encounter (Signed)
Copied from [place] [id number]. Topic: General - Other >> Jun 23, 2020  4:59 PM Mcneil, Ja-Kwan wrote: Reason for CRM: Pt returned call to the office. Attempted to transfer pt but there was no answer. Pt requests call back

## 2020-07-01 ENCOUNTER — Telehealth: Payer: Self-pay | Admitting: Licensed Clinical Social Worker

## 2020-07-01 MED FILL — ROSUVASTATIN CALCIUM 40 MG: 40 | 90 days supply | Qty: 90 | Fill #0

## 2020-07-01 NOTE — Telephone Encounter (Signed)
Follow up call placed to patient; however, there was no answer. LCSW left a detailed message informing pt that the initial referral to [facility] accepting new patients with Medicaid. The referral coordinator placed a new referral to [facility].

## 2020-07-19 MED FILL — GABAPENTIN 300 MG CAPSULE: 300 | 30 days supply | Qty: 30 | Fill #1

## 2020-07-20 ENCOUNTER — Ambulatory Visit: Payer: MEDICAID | Attending: Critical Care Medicine

## 2020-07-20 DIAGNOSIS — Z23 Encounter for immunization: Secondary | ICD-10-CM

## 2020-07-20 NOTE — Progress Notes (Signed)
   Covid-19 Vaccination Clinic  Name:  Tiago Humphrey    MRN: [id number] DOB: May 05, 1955  07/20/2020  Mr. Alspaugh was observed post Covid-19 immunization for 15 minutes without incident. He was provided with Vaccine Information Sheet and instruction to access the V-Safe system.   Mr. Nham was instructed to call 911 with any severe reactions post vaccine: Marland Kitchen Difficulty breathing  . Swelling of face and throat  . A fast heartbeat  . A bad rash all over body  . Dizziness and weakness   Immunizations Administered    Name Date Dose VIS Date Route   Moderna COVID-19 Vaccine 07/20/2020  9:40 AM 0.5 mL 02/11/2020 Intramuscular   Manufacturer: Moderna   Lot: [id number]   [place]: [id number]

## 2020-07-21 ENCOUNTER — Other Ambulatory Visit: Payer: Self-pay | Admitting: Family Medicine

## 2020-07-21 DIAGNOSIS — Z794 Long term (current) use of insulin: Secondary | ICD-10-CM

## 2020-07-21 DIAGNOSIS — E1169 Type 2 diabetes mellitus with other specified complication: Secondary | ICD-10-CM

## 2020-07-21 MED FILL — LANTUS SOLOSTAR 100 UNITS/M: 100 | 35 days supply | Qty: 30 | Fill #1

## 2020-07-21 NOTE — Telephone Encounter (Signed)
Patient unsure if he should be starting his new insulin but does not know the name of insulin and states he has 1 pin left. Patient states he will be picking up some other medications and today and would like to pick up insulin. Please follow up [place] [place] [employer] Phone:  [phone]  Fax:  [phone]

## 2020-07-21 NOTE — Telephone Encounter (Signed)
}    Notes to clinic:  review for refill  Patient was sure if he was suppose to be starting some new insulin  Patient has one pen left    Requested Prescriptions  Pending Prescriptions Disp Refills   insulin glargine (LANTUS SOLOSTAR) 100 UNIT/ML Solostar Pen 30 mL 3    Sig: Inject 42 Units into the skin 2 (two) times daily.      Endocrinology:  Diabetes - Insulins Passed - 07/21/2020  8:25 AM      Passed - HBA1C is between 0 and 7.9 and within 180 days    HbA1c, POC (controlled diabetic range)  Date Value Ref Range Status  06/08/2020 6.0 0.0 - 7.0 % Final          Passed - Valid encounter within last 6 months    Recent Outpatient Visits           1 month ago Type 2 diabetes mellitus with other specified complication, with long-term current use of insulin ([place])   [place], [place], MD   4 months ago Type 2 diabetes mellitus with hyperglycemia, unspecified whether long term insulin use ([place])   [place], [place], MD   5 months ago Type 2 diabetes mellitus with hyperglycemia, unspecified whether long term insulin use [facility])   [place], Annie Main L, RPH-CPP   6 months ago Type 2 diabetes mellitus with hyperglycemia, unspecified whether long term insulin use [facility])   [place] Horace, Dionne Bucy, [place]       Future Appointments             In 1 month Charlott Rakes, MD [place]

## 2020-07-22 MED ORDER — LANTUS SOLOSTAR 100 UNIT/ML ~~LOC~~ SOPN
42.0000 [IU] | PEN_INJECTOR | Freq: Two times a day (BID) | SUBCUTANEOUS | 3 refills | Status: DC
  Filled 2020-08-26: qty 30, 36d supply, fill #0
  Filled 2020-09-29: qty 30, 36d supply, fill #1

## 2020-07-23 ENCOUNTER — Telehealth: Payer: Self-pay | Admitting: Family Medicine

## 2020-07-23 DIAGNOSIS — E1169 Type 2 diabetes mellitus with other specified complication: Secondary | ICD-10-CM

## 2020-07-23 MED ORDER — ACCU-CHEK GUIDE VI STRP: ORAL_STRIP | 2 refills | Status: DC

## 2020-07-23 MED ORDER — ACCU-CHEK GUIDE W/DEVICE KIT: PACK | 0 refills | Status: DC

## 2020-07-23 MED ORDER — ACCU-CHEK SOFTCLIX LANCETS MISC: 2 refills | Status: DC

## 2020-07-23 NOTE — Telephone Encounter (Signed)
Copied from [place] [id number]. Topic: General - Other >> Jul 22, 2020  1:36 PM Tessa Lerner A wrote: Reason for CRM: Patient's daughter has made contact to request for patient to be prescribed new test strips to monitor their blood sugar   Patient's daughter would like prescription for test strips sent to the [facility]  Patient's daughter is also uncertain if patient's new insurance will be able to assist with covering the cost of test strips  Please contact to further advise

## 2020-07-23 NOTE — Telephone Encounter (Signed)
Pt's daughter says that the patient wants any medication prescribed by PCP she wants it to come from the pharmacy at [facility].

## 2020-07-24 ENCOUNTER — Other Ambulatory Visit: Payer: Self-pay

## 2020-08-09 ENCOUNTER — Other Ambulatory Visit: Payer: Self-pay

## 2020-08-09 MED FILL — Insulin Pen Needle 31 G X 8 MM (1/3" or 5/16"): 25 days supply | Qty: 100 | Fill #0 | Status: AC

## 2020-08-16 ENCOUNTER — Telehealth: Payer: Self-pay

## 2020-08-16 NOTE — Telephone Encounter (Signed)
Attempted to reach patient concerning pre visit appt that patient failed to show up for Mailbox is full and nurse was unable to leave a message for patient to call back to reschedule appt for pre visit Will attempt to reach patient prior to end of business day today Patient will need to reschedule pre visit appt if nurse is unable to reach patient

## 2020-08-16 NOTE — Telephone Encounter (Signed)
Nurse attempted to reach patient prior to end of business day-unable to reach patient - PV and procedure appts cancelled and no show letter sent to patient-

## 2020-08-19 LAB — HM DIABETES EYE EXAM

## 2020-08-22 MED FILL — Gabapentin Cap 300 MG: ORAL | 30 days supply | Qty: 30 | Fill #0 | Status: AC

## 2020-08-23 ENCOUNTER — Other Ambulatory Visit: Payer: Self-pay

## 2020-08-26 ENCOUNTER — Other Ambulatory Visit: Payer: Self-pay

## 2020-08-27 ENCOUNTER — Other Ambulatory Visit: Payer: Self-pay

## 2020-08-30 ENCOUNTER — Encounter: Payer: Medicaid Other | Admitting: Internal Medicine

## 2020-09-08 ENCOUNTER — Other Ambulatory Visit: Payer: Self-pay

## 2020-09-08 ENCOUNTER — Ambulatory Visit: Payer: MEDICAID | Attending: Family Medicine | Admitting: Family Medicine

## 2020-09-08 ENCOUNTER — Encounter: Payer: Self-pay | Admitting: Family Medicine

## 2020-09-08 VITALS — BP 133/89 | HR 83 | Ht 73.0 in | Wt 311.0 lb

## 2020-09-08 DIAGNOSIS — B351 Tinea unguium: Secondary | ICD-10-CM | POA: Diagnosis not present

## 2020-09-08 DIAGNOSIS — Z1211 Encounter for screening for malignant neoplasm of colon: Secondary | ICD-10-CM

## 2020-09-08 DIAGNOSIS — Z794 Long term (current) use of insulin: Secondary | ICD-10-CM

## 2020-09-08 DIAGNOSIS — E1149 Type 2 diabetes mellitus with other diabetic neurological complication: Secondary | ICD-10-CM

## 2020-09-08 DIAGNOSIS — E1169 Type 2 diabetes mellitus with other specified complication: Secondary | ICD-10-CM | POA: Diagnosis not present

## 2020-09-08 LAB — POCT GLYCOSYLATED HEMOGLOBIN (HGB A1C): HbA1c, POC (controlled diabetic range): 6.7 % (ref 0.0–7.0)

## 2020-09-08 LAB — GLUCOSE, POCT (MANUAL RESULT ENTRY): POC Glucose: 124 mg/dl — AB (ref 70–99)

## 2020-09-08 MED ORDER — GABAPENTIN 300 MG PO CAPS
300.0000 mg | ORAL_CAPSULE | Freq: Every day | ORAL | 6 refills | Status: DC
  Filled 2020-09-08 (×2): qty 60, 60d supply, fill #0

## 2020-09-08 NOTE — Patient Instructions (Signed)
Diabetes Mellitus and Foot Care Foot care is an important part of your health, especially when you have diabetes. Diabetes may cause you to have problems because of poor blood flow (circulation) to your feet and legs, which can cause your skin to:  Become thinner and drier.  Break more easily.  Heal more slowly.  Peel and crack. You may also have nerve damage (neuropathy) in your legs and feet, causing decreased feeling in them. This means that you may not notice minor injuries to your feet that could lead to more serious problems. Noticing and addressing any potential problems early is the best way to prevent future foot problems. How to care for your feet Foot hygiene  Wash your feet daily with warm water and mild soap. Do not use hot water. Then, pat your feet and the areas between your toes until they are completely dry. Do not soak your feet as this can dry your skin.  Trim your toenails straight across. Do not dig under them or around the cuticle. File the edges of your nails with an emery board or nail file.  Apply a moisturizing lotion or petroleum jelly to the skin on your feet and to dry, brittle toenails. Use lotion that does not contain alcohol and is unscented. Do not apply lotion between your toes.   Shoes and socks  Wear clean socks or stockings every day. Make sure they are not too tight. Do not wear knee-high stockings since they may decrease blood flow to your legs.  Wear shoes that fit properly and have enough cushioning. Always look in your shoes before you put them on to be sure there are no objects inside.  To break in new shoes, wear them for just a few hours a day. This prevents injuries on your feet. Wounds, scrapes, corns, and calluses  Check your feet daily for blisters, cuts, bruises, sores, and redness. If you cannot see the bottom of your feet, use a mirror or ask someone for help.  Do not cut corns or calluses or try to remove them with medicine.  If you  find a minor scrape, cut, or break in the skin on your feet, keep it and the skin around it clean and dry. You may clean these areas with mild soap and water. Do not clean the area with peroxide, alcohol, or iodine.  If you have a wound, scrape, corn, or callus on your foot, look at it several times a day to make sure it is healing and not infected. Check for: ? Redness, swelling, or pain. ? Fluid or blood. ? Warmth. ? Pus or a bad smell.   General tips  Do not cross your legs. This may decrease blood flow to your feet.  Do not use heating pads or hot water bottles on your feet. They may burn your skin. If you have lost feeling in your feet or legs, you may not know this is happening until it is too late.  Protect your feet from hot and cold by wearing shoes, such as at the beach or on hot pavement.  Schedule a complete foot exam at least once a year (annually) or more often if you have foot problems. Report any cuts, sores, or bruises to your health care provider immediately. Where to find more information  American Diabetes Association: www.diabetes.org  Association of Diabetes Care & Education Specialists: www.diabeteseducator.org Contact a health care provider if:  You have a medical condition that increases your risk of infection and   you have any cuts, sores, or bruises on your feet.  You have an injury that is not healing.  You have redness on your legs or feet.  You feel burning or tingling in your legs or feet.  You have pain or cramps in your legs and feet.  Your legs or feet are numb.  Your feet always feel cold.  You have pain around any toenails. Get help right away if:  You have a wound, scrape, corn, or callus on your foot and: ? You have pain, swelling, or redness that gets worse. ? You have fluid or blood coming from the wound, scrape, corn, or callus. ? Your wound, scrape, corn, or callus feels warm to the touch. ? You have pus or a bad smell coming from  the wound, scrape, corn, or callus. ? You have a fever. ? You have a red line going up your leg. Summary  Check your feet every day for blisters, cuts, bruises, sores, and redness.  Apply a moisturizing lotion or petroleum jelly to the skin on your feet and to dry, brittle toenails.  Wear shoes that fit properly and have enough cushioning.  If you have foot problems, report any cuts, sores, or bruises to your health care provider immediately.  Schedule a complete foot exam at least once a year (annually) or more often if you have foot problems. This information is not intended to replace advice given to you by your health care provider. Make sure you discuss any questions you have with your health care provider. Document Revised: 10/30/2019 Document Reviewed: 10/30/2019 Elsevier Patient Education  2021 Elsevier Inc.  

## 2020-09-08 NOTE — Progress Notes (Signed)
Subjective:  Patient ID: Bill Torres, male    DOB: Jul 31, 1955  Age: 65 y.o. MRN: [id number]  CC: Diabetes   HPI Bill Torres  is a 65 year old male with a history of obesity, type 2 diabetes mellitus (A1c 6.7 diagnosed in 11/2019), hyperlipidemia who presents today  for chronic disease management.  Interval History: Neuropathy has not improved on current dose of gabapentin.  He endorses compliance with his diabetic regimen and has had no hypoglycemic episodes. Had eye appointment last month and will be undergoing R eye cataract surgery on 11/26/20.  Does not get much exercise. Tolerating his statin with no complaints of adverse effects. He has no chest pain or dyspnea. Past Medical History:  Diagnosis Date  . Dental abscess 12/15/2019  . Hyperlipidemia associated with type 2 diabetes mellitus ([place]) 12/14/2019    No past surgical history on file.  No family history on file.  No Known Allergies  Outpatient Medications Prior to Visit  Medication Sig Dispense Refill  . Accu-Chek Softclix Lancets lancets Use to check blood sugar TID. 100 each 2  . Blood Glucose Monitoring Suppl (ACCU-CHEK GUIDE) w/Device KIT Use to check blood sugar TID. 1 kit 0  . empagliflozin (JARDIANCE) 10 MG TABS tablet TAKE 1 TABLET (10 MG TOTAL) BY MOUTH DAILY. 30 tablet 3  . gabapentin (NEURONTIN) 300 MG capsule TAKE 1 CAPSULE (300 MG TOTAL) BY MOUTH AT BEDTIME. 30 capsule 6  . glucose blood (ACCU-CHEK GUIDE) test strip Use to check blood sugar TID. 100 each 2  . insulin glargine (LANTUS SOLOSTAR) 100 UNIT/ML Solostar Pen Inject 42 Units into the skin 2 (two) times daily. 30 mL 3  . Insulin Pen Needle (PEN NEEDLES) 31G X 8 MM MISC Use as directed with insulin pen 100 each 3  . Insulin Pen Needle 31G X 8 MM MISC USE AS DIRECTED WITH INSULIN PEN 100 each 3  . rosuvastatin (CRESTOR) 40 MG tablet TAKE 1 TABLET (40 MG TOTAL) BY MOUTH DAILY. 90 tablet 1   No facility-administered medications prior  to visit.     ROS Review of Systems  Constitutional: Negative for activity change and appetite change.  HENT: Negative for sinus pressure and sore throat.   Eyes: Negative for visual disturbance.  Respiratory: Negative for cough, chest tightness and shortness of breath.   Cardiovascular: Negative for chest pain and leg swelling.  Gastrointestinal: Negative for abdominal distention, abdominal pain, constipation and diarrhea.  Endocrine: Negative.   Genitourinary: Negative for dysuria.  Musculoskeletal: Negative for joint swelling and myalgias.  Skin: Negative for rash.  Allergic/Immunologic: Negative.   Neurological: Positive for numbness. Negative for weakness and light-headedness.  Psychiatric/Behavioral: Negative for dysphoric mood and suicidal ideas.    Objective:  BP 133/89   Pulse 83   Ht 6' 1"  (1.854 m)   Wt (!) 311 lb (141.1 kg)   SpO2 99%   BMI 41.03 kg/m   BP/Weight 09/08/2020 06/08/2020 69/79/4801  Systolic BP 655 374 827  Diastolic BP 89 81 82  Wt. (Lbs) 311 293 292.8  BMI 41.03 38.66 38.63      Physical Exam Constitutional:      Appearance: He is well-developed. He is obese.  Neck:     Vascular: No JVD.  Cardiovascular:     Rate and Rhythm: Normal rate.     Heart sounds: Normal heart sounds. No murmur heard.   Pulmonary:     Effort: Pulmonary effort is normal.     Breath sounds: Normal breath  sounds. No wheezing or rales.  Chest:     Chest wall: No tenderness.  Abdominal:     General: Bowel sounds are normal. There is no distension.     Palpations: Abdomen is soft. There is no mass.     Tenderness: There is no abdominal tenderness.  Musculoskeletal:        General: Normal range of motion.     Right lower leg: No edema.     Left lower leg: No edema.  Neurological:     Mental Status: He is alert and oriented to person, place, and time.  Psychiatric:        Mood and Affect: Mood normal.    Diabetic Foot Exam - Simple   Simple Foot  Form Visual Inspection See comments: Yes Sensation Testing See comments: Yes Pulse Check Posterior Tibialis and Dorsalis pulse intact bilaterally: Yes Comments Onychomycosis bilaterally x10.  Dry scaly skin on soles of both feet.  No ulceration or skin breakdown Dysesthesia of both feet      CMP Latest Ref Rng & Units 06/16/2020 01/07/2020 12/23/2019  Glucose 65 - 99 mg/dL 91 183(H) 260(H)  BUN 8 - 27 mg/dL 13 4(L) 5(L)  Creatinine 0.76 - 1.27 mg/dL 0.95 0.74(L) 0.98  Sodium 134 - 144 mmol/L 142 141 143  Potassium 3.5 - 5.2 mmol/L 4.5 4.4 3.6  Chloride 96 - 106 mmol/L 101 102 104  CO2 20 - 29 mmol/L 22 26 30   Calcium 8.6 - 10.2 mg/dL 9.8 9.0 8.8(L)  Total Protein 6.0 - 8.5 g/dL 8.2 7.0 -  Total Bilirubin 0.0 - 1.2 mg/dL 0.6 0.7 -  Alkaline Phos 44 - 121 IU/L 131(H) 144(H) -  AST 0 - 40 IU/L 21 31 -  ALT 0 - 44 IU/L 21 41 -    Lipid Panel     Component Value Date/Time   CHOL 241 (H) 06/16/2020 1130   TRIG 337 (H) 06/16/2020 1130   HDL 31 (L) 06/16/2020 1130   CHOLHDL 7.8 (H) 06/16/2020 1130   CHOLHDL 9.2 12/13/2019 1647   VLDL 60 (H) 12/13/2019 1647   LDLCALC 147 (H) 06/16/2020 1130    CBC    Component Value Date/Time   WBC 7.5 01/07/2020 1111   WBC 14.4 (H) 12/13/2019 1647   RBC 4.90 01/07/2020 1111   RBC 6.06 (H) 12/13/2019 1647   HGB 14.4 01/07/2020 1111   HCT 44.1 01/07/2020 1111   PLT 235 01/07/2020 1111   MCV 90 01/07/2020 1111   MCH 29.4 01/07/2020 1111   MCH 30.7 12/13/2019 1647   MCHC 32.7 01/07/2020 1111   MCHC 32.3 12/13/2019 1647   RDW 12.8 01/07/2020 1111   LYMPHSABS 2.6 01/07/2020 1111   MONOABS 1.0 12/13/2019 1647   EOSABS 0.3 01/07/2020 1111   BASOSABS 0.0 01/07/2020 1111    Lab Results  Component Value Date   HGBA1C 6.7 09/08/2020    Assessment & Plan:  1. Type 2 diabetes mellitus with other specified complication, with long-term current use of insulin (HCC) Controlled with A1c of 6.7 Continue current regimen - POCT glucose (manual  entry) - POCT glycosylated hemoglobin (Hb A1C)  2. Screening for colon cancer Has upcoming appointment for colonoscopy in 11/2020  3. Other diabetic neurological complication associated with type 2 diabetes mellitus (HCC) Uncontrolled Increased dose of gabapentin - gabapentin (NEURONTIN) 300 MG capsule; Take 1 capsule (300 mg total) by mouth at bedtime.  Dispense: 60 capsule; Refill: 6  4. Onychomycosis - Ambulatory referral to Podiatry  5. Morbid obesity ([place]) Will need to work on cutting down portion sizes, avoiding late meals, increasing physical activity up to goal of 150 minutes/week.  Health Care Maintenance: Colonoscopy scheduled on 11/23/2020 No orders of the defined types were placed in this encounter.   Return in about 3 months (around 12/09/2020) for Chronic disease management.       Charlott Rakes, MD, FAAFP. [facility] and [place], [place]   09/08/2020, 3:25 PM

## 2020-09-09 ENCOUNTER — Other Ambulatory Visit: Payer: Self-pay

## 2020-09-09 MED ORDER — GABAPENTIN 300 MG PO CAPS
300.0000 mg | ORAL_CAPSULE | Freq: Two times a day (BID) | ORAL | 6 refills | Status: DC
  Filled 2020-09-09 – 2020-09-16 (×2): qty 60, 30d supply, fill #0

## 2020-09-13 ENCOUNTER — Emergency Department (HOSPITAL_COMMUNITY)
Admission: EM | Admit: 2020-09-13 | Discharge: 2020-09-13 | Disposition: A | Discharge status: Home / Self Care | Payer: Medicaid Other | Attending: Emergency Medicine | Admitting: Emergency Medicine

## 2020-09-13 ENCOUNTER — Ambulatory Visit: Payer: MEDICAID | Admitting: Podiatry

## 2020-09-13 ENCOUNTER — Other Ambulatory Visit: Payer: Self-pay

## 2020-09-13 ENCOUNTER — Telehealth: Payer: Self-pay | Admitting: *Deleted

## 2020-09-13 ENCOUNTER — Emergency Department (HOSPITAL_COMMUNITY): Payer: Medicaid Other

## 2020-09-13 ENCOUNTER — Encounter (HOSPITAL_COMMUNITY): Payer: Self-pay | Admitting: Emergency Medicine

## 2020-09-13 DIAGNOSIS — E1129 Type 2 diabetes mellitus with other diabetic kidney complication: Secondary | ICD-10-CM | POA: Insufficient documentation

## 2020-09-13 DIAGNOSIS — E785 Hyperlipidemia, unspecified: Secondary | ICD-10-CM | POA: Diagnosis not present

## 2020-09-13 DIAGNOSIS — Z79899 Other long term (current) drug therapy: Secondary | ICD-10-CM | POA: Diagnosis not present

## 2020-09-13 DIAGNOSIS — R109 Unspecified abdominal pain: Secondary | ICD-10-CM | POA: Diagnosis present

## 2020-09-13 DIAGNOSIS — N200 Calculus of kidney: Secondary | ICD-10-CM

## 2020-09-13 DIAGNOSIS — N132 Hydronephrosis with renal and ureteral calculous obstruction: Secondary | ICD-10-CM | POA: Diagnosis not present

## 2020-09-13 DIAGNOSIS — Z794 Long term (current) use of insulin: Secondary | ICD-10-CM | POA: Insufficient documentation

## 2020-09-13 DIAGNOSIS — Z7984 Long term (current) use of oral hypoglycemic drugs: Secondary | ICD-10-CM | POA: Insufficient documentation

## 2020-09-13 DIAGNOSIS — E1169 Type 2 diabetes mellitus with other specified complication: Secondary | ICD-10-CM | POA: Diagnosis not present

## 2020-09-13 DIAGNOSIS — R112 Nausea with vomiting, unspecified: Secondary | ICD-10-CM

## 2020-09-13 LAB — URINALYSIS, ROUTINE W REFLEX MICROSCOPIC
Bacteria, UA: NONE SEEN
Bilirubin Urine: NEGATIVE
Glucose, UA: NEGATIVE mg/dL
Ketones, ur: NEGATIVE mg/dL
Leukocytes,Ua: NEGATIVE
Nitrite: NEGATIVE
Protein, ur: NEGATIVE mg/dL
RBC / HPF: >50 RBC/hpf — ABNORMAL HIGH (ref 0–5)
Specific Gravity, Urine: 1.012 (ref 1.005–1.030)
pH: 6 (ref 5.0–8.0)

## 2020-09-13 LAB — COMPREHENSIVE METABOLIC PANEL
ALT: 33 U/L (ref 0–44)
AST: 27 U/L (ref 15–41)
Albumin: 3.7 g/dL (ref 3.5–5.0)
Alkaline Phosphatase: 88 U/L (ref 38–126)
Anion gap: 8 (ref 5–15)
BUN: 8 mg/dL (ref 8–23)
CO2: 24 mmol/L (ref 22–32)
Calcium: 8.8 mg/dL — ABNORMAL LOW (ref 8.9–10.3)
Chloride: 107 mmol/L (ref 98–111)
Creatinine, Ser: 1.01 mg/dL (ref 0.61–1.24)
GFR, Estimated: >60 mL/min (ref >60)
Glucose, Bld: 143 mg/dL — ABNORMAL HIGH (ref 70–99)
Potassium: 3.4 mmol/L — ABNORMAL LOW (ref 3.5–5.1)
Sodium: 139 mmol/L (ref 135–145)
Total Bilirubin: 1.1 mg/dL (ref 0.3–1.2)
Total Protein: 7.5 g/dL (ref 6.5–8.1)

## 2020-09-13 LAB — CBC
HCT: 45.9 % (ref 39.0–52.0)
Hemoglobin: 15.6 g/dL (ref 13.0–17.0)
MCH: 30.5 pg (ref 26.0–34.0)
MCHC: 34 g/dL (ref 30.0–36.0)
MCV: 89.8 fL (ref 80.0–100.0)
Platelets: 232 10*3/uL (ref 150–400)
RBC: 5.11 MIL/uL (ref 4.22–5.81)
RDW: 13.3 % (ref 11.5–15.5)
WBC: 10.6 10*3/uL — ABNORMAL HIGH (ref 4.0–10.5)
nRBC: 0 % (ref 0.0–0.2)

## 2020-09-13 LAB — LIPASE, BLOOD: Lipase: 20 U/L (ref 11–51)

## 2020-09-13 LAB — LACTIC ACID, PLASMA: Lactic Acid, Venous: 1.9 mmol/L (ref 0.5–1.9)

## 2020-09-13 MED ORDER — OXYCODONE-ACETAMINOPHEN 5-325 MG PO TABS: 1.0000 | ORAL_TABLET | ORAL | 0 refills | Status: DC | PRN

## 2020-09-13 MED ORDER — ONDANSETRON HCL 4 MG PO TABS
4.0000 mg | ORAL_TABLET | Freq: Three times a day (TID) | ORAL | 0 refills | Status: DC | PRN
Start: 2020-09-13 — End: 2022-09-05

## 2020-09-13 NOTE — ED Notes (Signed)
Reviewed discharge instructions with patient. Follow-up care and medications reviewed. Patient  verbalized understanding. Patient A&Ox4, VSS, and ambulatory with steady gait upon discharge.  °

## 2020-09-13 NOTE — ED Notes (Signed)
Wasn't able to get patient blood, The Nurse was informed.

## 2020-09-13 NOTE — ED Notes (Signed)
Lab to add on lipase to previous collection. Will attempt to draw lactic acid.

## 2020-09-13 NOTE — ED Triage Notes (Signed)
Pt here from home with c/o left flank pain and nausea that started this morning pain starts in that back and radiates to the front

## 2020-09-13 NOTE — ED Notes (Addendum)
Pt reports L back pain onset this morning that radiates to the L side of abd. Pt reports vomiting x 3 today. Denies urinary s/s. Pt denies any pain at this time. He describes the pain as squeezing when he did experience pain.

## 2020-09-13 NOTE — ED Provider Notes (Signed)
[facility] EMERGENCY DEPARTMENT Provider Note   CSN: [id number] Arrival date & time: 09/13/20  2244     History No chief complaint on file.   Bill Torres is a 65 y.o. male.  The history is provided by the patient and medical records. No language interpreter was used.  Flank Pain This is a new problem. The current episode started 3 to 5 hours ago. The problem occurs constantly. The problem has been gradually improving. Associated symptoms include abdominal pain. Pertinent negatives include no chest pain, no headaches and no shortness of breath. Nothing aggravates the symptoms. Nothing relieves the symptoms. He has tried nothing for the symptoms. The treatment provided no relief.       Past Medical History:  Diagnosis Date  . Dental abscess 12/15/2019  . Hyperlipidemia associated with type 2 diabetes mellitus ([place]) 12/14/2019    Patient Active Problem List   Diagnosis Date Noted  . Type 2 diabetes mellitus with hyperglycemia ([place]) 01/28/2020  . Hyperglycemia   . Groin swelling   . Dental abscess 12/15/2019  . Acute kidney injury ([place]) 12/14/2019  . Dehydration with hypernatremia 12/14/2019  . Hyperlipidemia associated with type 2 diabetes mellitus ([place]) 12/14/2019  . Proteinuria due to type 2 diabetes mellitus ([place]) 12/14/2019  . Polycythemia 12/14/2019  . Pyuria 12/14/2019  . Hematuria 12/14/2019  . Elevated brain natriuretic peptide (BNP) level 12/14/2019  . Dysgeusia 12/14/2019  . Anosmia 12/14/2019  . Morbid obesity ([place]) 12/14/2019  . Mild concentric left ventricular hypertrophy (LVH) 12/14/2019  . DKA (diabetic ketoacidoses) 12/14/2019  . Diabetic acidosis without coma ([place]) 12/13/2019  . Diabetes mellitus, new onset ([place])     History reviewed. No pertinent surgical history.     No family history on file.  Social History   Tobacco Use  . Smoking status: Never Smoker  . Smokeless tobacco: Never Used  Substance Use Topics  .  Alcohol use: Not Currently  . Drug use: Not Currently    Home Medications Prior to Admission medications   Medication Sig Start Date End Date Taking? Authorizing Provider  Accu-Chek Softclix Lancets lancets Use to check blood sugar TID. 07/23/20   Charlott Rakes, MD  Blood Glucose Monitoring Suppl (ACCU-CHEK GUIDE) w/Device KIT Use to check blood sugar TID. 07/23/20   Charlott Rakes, MD  empagliflozin (JARDIANCE) 10 MG TABS tablet TAKE 1 TABLET (10 MG TOTAL) BY MOUTH DAILY. 03/09/20 03/09/21  Charlott Rakes, MD  gabapentin (NEURONTIN) 300 MG capsule Take 1 capsule (300 mg total) by mouth 2 (two) times daily. 09/09/20 09/09/21  Charlott Rakes, MD  glucose blood (ACCU-CHEK GUIDE) test strip Use to check blood sugar TID. 07/23/20   Charlott Rakes, MD  insulin glargine (LANTUS SOLOSTAR) 100 UNIT/ML Solostar Pen Inject 42 Units into the skin 2 (two) times daily. 07/22/20   Charlott Rakes, MD  Insulin Pen Needle (PEN NEEDLES) 31G X 8 MM MISC Use as directed with insulin pen 04/06/20   Argentina Donovan, PA-C  Insulin Pen Needle 31G X 8 MM MISC USE AS DIRECTED WITH INSULIN PEN 01/07/20 01/06/21  Argentina Donovan, PA-C  rosuvastatin (CRESTOR) 40 MG tablet TAKE 1 TABLET (40 MG TOTAL) BY MOUTH DAILY. 06/24/20 06/24/21  Charlott Rakes, MD    Allergies    Patient has no known allergies.  Review of Systems   Review of Systems  Constitutional: Negative for chills, diaphoresis, fatigue and fever.  HENT: Negative for congestion.   Respiratory: Negative for cough, chest tightness and shortness of  breath.   Cardiovascular: Negative for chest pain.  Gastrointestinal: Positive for abdominal pain. Negative for constipation, diarrhea, nausea and vomiting.  Genitourinary: Positive for flank pain. Negative for dysuria and frequency.  Musculoskeletal: Positive for back pain. Negative for neck pain and neck stiffness.  Skin: Negative for rash and wound.  Neurological: Negative for facial asymmetry, light-headedness  and headaches.  Psychiatric/Behavioral: Negative for agitation and behavioral problems.    Physical Exam Updated Vital Signs BP 140/82   Pulse 78   Temp 98.2 F (36.8 C) (Oral)   Resp 16   SpO2 100%   Physical Exam Vitals and nursing note reviewed.  Constitutional:      General: He is not in acute distress.    Appearance: He is well-developed. He is not ill-appearing, toxic-appearing or diaphoretic.  HENT:     Head: Normocephalic and atraumatic.     Nose: No congestion or rhinorrhea.     Mouth/Throat:     Mouth: Mucous membranes are moist.     Pharynx: No oropharyngeal exudate or posterior oropharyngeal erythema.  Eyes:     Extraocular Movements: Extraocular movements intact.     Conjunctiva/sclera: Conjunctivae normal.     Pupils: Pupils are equal, round, and reactive to light.  Cardiovascular:     Rate and Rhythm: Normal rate and regular rhythm.     Heart sounds: No murmur heard.   Pulmonary:     Effort: Pulmonary effort is normal. No respiratory distress.     Breath sounds: Normal breath sounds. No wheezing, rhonchi or rales.  Chest:     Chest wall: No tenderness.  Abdominal:     General: Abdomen is flat.     Palpations: Abdomen is soft.     Tenderness: There is no abdominal tenderness. There is no right CVA tenderness, left CVA tenderness, guarding or rebound.  Musculoskeletal:        General: No tenderness.     Cervical back: Neck supple. No tenderness.     Right lower leg: No edema.     Left lower leg: No edema.  Skin:    General: Skin is warm and dry.     Findings: No erythema.  Neurological:     General: No focal deficit present.     Mental Status: He is alert.  Psychiatric:        Mood and Affect: Mood normal.     ED Results / Procedures / Treatments   Labs (all labs ordered are listed, but only abnormal results are displayed) Labs Reviewed  CBC - Abnormal; Notable for the following components:      Result Value   WBC 10.6 (*)    All other  components within normal limits  COMPREHENSIVE METABOLIC PANEL - Abnormal; Notable for the following components:   Potassium 3.4 (*)    Glucose, Bld 143 (*)    Calcium 8.8 (*)    All other components within normal limits  URINALYSIS, ROUTINE W REFLEX MICROSCOPIC - Abnormal; Notable for the following components:   APPearance HAZY (*)    Hgb urine dipstick LARGE (*)    RBC / HPF >50 (*)    All other components within normal limits  URINE CULTURE  LIPASE, BLOOD  LACTIC ACID, PLASMA  LACTIC ACID, PLASMA    EKG None  Radiology CT Renal Stone Study  Result Date: 09/13/2020 CLINICAL DATA:  65 year old male with left flank pain radiating to the abdomen. EXAM: CT ABDOMEN AND PELVIS WITHOUT CONTRAST TECHNIQUE: Multidetector CT imaging of the  abdomen and pelvis was performed following the standard protocol without IV contrast. COMPARISON:  None. FINDINGS: Lower chest: Negative aside from mildly tortuous descending thoracic aorta. Hepatobiliary: Possible hepatic steatosis. Numerous rim calcified gallstones individually up to 3 cm. No gallbladder inflammation is evident. Pancreas: Negative. Spleen: Negative. Adrenals/Urinary Tract: Normal adrenal glands. Punctate right upper pole nephrolithiasis. Otherwise negative noncontrast right kidney and ureter. Hydronephrotic left kidney with left perinephric stranding. Left ureter is dilated and inflamed into the pelvis where elongated or adjacent calculi measuring up to 7 mm in length are noted in the distal ureter less than 10 mm from the left UVJ. See series 3, image 80 and coronal image 84. Unremarkable bladder. Stomach/Bowel: Decompressed large bowel from the splenic flexure distally. Mild descending and moderate sigmoid colon diverticulosis with no active inflammation. Negative transverse and right colon. Normal appendix on series 3, image 59. Decompressed and negative terminal ileum. No dilated small bowel. Decompressed stomach and duodenum. No free air,  free fluid. Vascular/Lymphatic: Normal caliber abdominal aorta. Minimal iliac calcified atherosclerosis. Reproductive: Negative. Other: No pelvic free fluid.  Numerous pelvic phleboliths. Musculoskeletal: Lower lumbar facet arthropathy with vacuum facet. No acute osseous abnormality identified. IMPRESSION: 1. Acute obstructive uropathy on the left with clustered or single elongated stone in the distal ureter measuring 7 mm in length just proximal to the left UVJ. Possible forniceal rupture. 2. Punctate right nephrolithiasis. 3. Cholelithiasis without CT evidence of acute cholecystitis. 4. Diverticulosis of the descending and sigmoid colon with no active inflammation. Electronically Signed   By: Genevie Ann M.D.   On: 09/13/2020 10:13    Procedures Procedures   Medications Ordered in ED Medications - No data to display  ED Course  I have reviewed the triage vital signs and the nursing notes.  Pertinent labs & imaging results that were available during my care of the patient were reviewed by me and considered in my medical decision making (see chart for details).    MDM Rules/Calculators/A&P                          Sigifredo Pignato is a 65 y.o. male with a past medical history negative for diabetes with prior DKA and hyperlipidemia who presents with left flank pain.  Patient reports this morning he got up and then started having pain in his left back radiating around his left flank towards his left lower quadrant of the abdomen.  He reports this pain is new.  He reports it was extremely severe up to an 8.5 out of 10 in severity.  It is now down to a 2 out of 10.  He reports several episodes of nausea and vomiting but denies any blood in his emesis.  He denies any blood in his stools but did have a normal bowel movement this morning.  No reported constipation or diarrhea.  No recent trauma.  No rashes to suggest shingles reported.  Patient has no history of diverticulitis he reports.  He denies  fevers, chills, ingestion, shortness of breath, cough.  On exam, patient does not have significant tenderness in his left CVA or left flank area but he is still having pain there.  No rash seen to suggest shingles.  Bowel sounds were appreciated.  Legs nontender and patient has a normal gait.  Lungs clear and chest nontender.  Patient resting comfortably otherwise.  Clinically I suspect diverticulitis versus kidney stone versus a pyelonephritis.  Musculoskeletal etiology also considered.  Patient will  have a stone study as well as screening labs.  Anticipate reassessment after work-up  CT scan shows left-sided kidney stone likely causing symptoms.  It is either a stacked cluster of stones versus 1 elongated stone up to 7 mm total.  Some hydronephrosis and possible forniceal rupture also present.  I went to reassess the patient he reports that symptoms have nearly resolved and he is feeling much better.  I called urology to ensure follow-up and they had no further recommendations.  They will recommend follow-up as well as pain and nausea medicine as needed.  They agree to hold on antibiotics and that he does not need further work-up or management at this time.  Patient agrees and will follow up with [facility].  Patient had other questions or concerns and was discharged in good condition.  Final Clinical Impression(s) / ED Diagnoses Final diagnoses:  Kidney stone on left side  Non-intractable vomiting with nausea, unspecified vomiting type  Left flank pain    Rx / DC Orders ED Discharge Orders         Ordered    oxyCODONE-acetaminophen (PERCOCET/ROXICET) 5-325 MG tablet  Every 4 hours PRN        09/13/20 1443    ondansetron (ZOFRAN) 4 MG tablet  Every 8 hours PRN        09/13/20 1443         Clinical Impression: 1. Kidney stone on left side   2. Non-intractable vomiting with nausea, unspecified vomiting type   3. Left flank pain     Disposition: Discharge  Condition:  Good  I have discussed the results, Dx and Tx plan with the pt(& family if present). He/she/they expressed understanding and agree(s) with the plan. Discharge instructions discussed at great length. Strict return precautions discussed and pt &/or family have verbalized understanding of the instructions. No further questions at time of discharge.    Discharge Medication List as of 09/13/2020  3:03 PM    START taking these medications   Details  ondansetron (ZOFRAN) 4 MG tablet Take 1 tablet (4 mg total) by mouth every 8 (eight) hours as needed., Starting Mon 09/13/2020, Normal    oxyCODONE-acetaminophen (PERCOCET/ROXICET) 5-325 MG tablet Take 1 tablet by mouth every 4 (four) hours as needed for severe pain., Starting Mon 09/13/2020, Normal        Follow Up: Irine Seal, MD [place] [address] [place] EMERGENCY DEPARTMENT [address] [id number] mc [place]       Aariel Ems, Gwenyth Allegra, MD 09/13/20 1540

## 2020-09-13 NOTE — Discharge Instructions (Signed)
Your work-up today revealed evidence of kidney stones on the left side likely causing your discomfort.  The urine did not show infection.  I spoke to urology who wants to see you in clinic but please use the pain nausea medicine to help with symptoms until then.  Please stay hydrated.  If any symptoms change or worsen, please return to the nearest emergency department.

## 2020-09-13 NOTE — ED Notes (Signed)
Pt transported to CT ?

## 2020-09-13 NOTE — Telephone Encounter (Signed)
Patient is calling and would like to reschedule his appointment for today,has been hospitalized w/ kidney issues.

## 2020-09-14 ENCOUNTER — Telehealth: Payer: Self-pay | Admitting: Family Medicine

## 2020-09-14 DIAGNOSIS — N200 Calculus of kidney: Secondary | ICD-10-CM

## 2020-09-14 LAB — URINE CULTURE: Culture: NO GROWTH

## 2020-09-14 NOTE — Telephone Encounter (Signed)
Copied from [place] [phone]. Topic: General - Inquiry >> Sep 13, 2020  4:59 PM Greggory Keen D wrote: Reason for CRM: Pt called saying he was in the ER this afternoon for Kidney stones and will need a referral to an urologist in his network.  He also wants to go over his med list.  The hospital told him he should be still taking the Jardiance but he said Dr. Margarita Rana took him off of it he thought.  CB#  [phone]

## 2020-09-15 ENCOUNTER — Other Ambulatory Visit: Payer: Self-pay

## 2020-09-15 MED FILL — Empagliflozin Tab 10 MG: ORAL | 30 days supply | Qty: 30 | Fill #0 | Status: AC

## 2020-09-15 NOTE — Telephone Encounter (Signed)
Pt was called and informed to continue taking medication and referral has been placed.

## 2020-09-15 NOTE — Telephone Encounter (Signed)
Patient returned call to Angelina Sheriff can be reached at Ph# [phone]

## 2020-09-15 NOTE — Telephone Encounter (Signed)
Call placed to patient and mailbox is currently full. 

## 2020-09-15 NOTE — Telephone Encounter (Signed)
Pt wants to know if he should be taking Jardiance, last A1C was 6.7.  Patient is needing referral to urology.

## 2020-09-15 NOTE — Telephone Encounter (Signed)
He should still be taking [place]

## 2020-09-16 ENCOUNTER — Other Ambulatory Visit: Payer: Self-pay

## 2020-09-17 ENCOUNTER — Other Ambulatory Visit: Payer: Self-pay

## 2020-09-21 ENCOUNTER — Ambulatory Visit: Payer: Medicaid Other | Admitting: Podiatry

## 2020-09-23 ENCOUNTER — Ambulatory Visit: Payer: Medicaid Other | Admitting: Podiatry

## 2020-09-29 ENCOUNTER — Other Ambulatory Visit: Payer: Self-pay

## 2020-09-29 ENCOUNTER — Other Ambulatory Visit: Payer: Self-pay | Admitting: Pharmacist

## 2020-09-29 ENCOUNTER — Other Ambulatory Visit: Payer: Self-pay | Admitting: Physician Assistant

## 2020-09-29 MED ORDER — ROSUVASTATIN CALCIUM 40 MG PO TABS
ORAL_TABLET | Freq: Every day | ORAL | 2 refills | Status: DC
Start: 2020-09-29 — End: 2020-10-22
  Filled 2020-09-29: qty 30, 30d supply, fill #0

## 2020-09-29 MED ORDER — TRUEPLUS PEN NEEDLES 31G X 8 MM MISC
3 refills | Status: DC
  Filled 2020-09-29: qty 100, 30d supply, fill #0

## 2020-09-29 MED FILL — Rosuvastatin Calcium Tab 40 MG: ORAL | 90 days supply | Qty: 90 | Fill #0 | Status: CN

## 2020-09-30 ENCOUNTER — Encounter: Payer: Self-pay | Admitting: Podiatry

## 2020-09-30 ENCOUNTER — Ambulatory Visit (INDEPENDENT_AMBULATORY_CARE_PROVIDER_SITE_OTHER): Payer: Medicaid Other | Admitting: Podiatry

## 2020-09-30 ENCOUNTER — Other Ambulatory Visit: Payer: Self-pay

## 2020-09-30 DIAGNOSIS — B351 Tinea unguium: Secondary | ICD-10-CM

## 2020-09-30 DIAGNOSIS — E1165 Type 2 diabetes mellitus with hyperglycemia: Secondary | ICD-10-CM | POA: Diagnosis not present

## 2020-09-30 DIAGNOSIS — M79674 Pain in right toe(s): Secondary | ICD-10-CM

## 2020-09-30 DIAGNOSIS — M79675 Pain in left toe(s): Secondary | ICD-10-CM

## 2020-10-03 ENCOUNTER — Encounter: Payer: Self-pay | Admitting: Podiatry

## 2020-10-03 NOTE — Progress Notes (Signed)
  Subjective:  Patient ID: Bill Torres, male    DOB: January 12, 1956,  MRN: [id number]  Chief Complaint  Patient presents with   Nail Problem      (np) Onychomycosis; bil    65 y.o. male presents with the above complaint. History confirmed with patient.  Nails are thickened elongated and he has difficulty cutting them  Objective:  Physical Exam: warm, good capillary refill, no trophic changes or ulcerative lesions, normal DP and PT pulses, normal monofilament exam, and normal sensory exam.  Thickened elongated nails with yellow-colored discoloration and subungual debris  Assessment:   1. Pain due to onychomycosis of toenails of both feet   2. Type 2 diabetes mellitus with hyperglycemia, unspecified whether long term insulin use ([place])      Plan:  Patient was evaluated and treated and all questions answered.  Patient educated on diabetes. Discussed proper diabetic foot care and discussed risks and complications of disease. Educated patient in depth on reasons to return to the office immediately should he/she discover anything concerning or new on the feet. All questions answered. Discussed proper shoes as well.   Discussed the etiology and treatment options for the condition in detail with the patient. Educated patient on the topical and oral treatment options for mycotic nails. Recommended debridement of the nails today. Sharp and mechanical debridement performed of all painful and mycotic nails today. Nails debrided in length and thickness using a nail nipper to level of comfort. Discussed treatment options including appropriate shoe gear. Follow up as needed for painful nails.    Return in about 3 months (around 12/31/2020) for at risk diabetic foot care.

## 2020-10-04 ENCOUNTER — Ambulatory Visit (AMBULATORY_SURGERY_CENTER): Payer: Medicaid Other | Admitting: *Deleted

## 2020-10-04 ENCOUNTER — Other Ambulatory Visit: Payer: Self-pay

## 2020-10-04 VITALS — Ht 73.0 in | Wt 311.0 lb

## 2020-10-04 DIAGNOSIS — Z1211 Encounter for screening for malignant neoplasm of colon: Secondary | ICD-10-CM

## 2020-10-04 MED ORDER — SUPREP BOWEL PREP KIT 17.5-3.13-1.6 GM/177ML PO SOLN: 1.0000 | ORAL | 0 refills | Status: DC

## 2020-10-04 NOTE — Progress Notes (Signed)

## 2020-10-04 NOTE — Progress Notes (Signed)
No egg or soy allergy known to patient  No issues with past sedation with any surgeries or procedures Patient denies ever being told they had issues or difficulty with intubation  No FH of Malignant Hyperthermia No diet pills per patient No home 02 use per patient  No blood thinners per patient  Pt denies issues with constipation  No A fib or A flutter  EMMI video to pt or via [place] 19 guidelines implemented in [place] today with Pt and RN  Pt is fully vaccinated  for Covid     Due to the COVID-19 pandemic we are asking patients to follow certain guidelines.  Pt aware of COVID protocols and [facility] guidelines   Pt verified name, DOB, address and insurance during PV today. Pt mailed instruction packet to included paper to complete and mail back to [facility] with addressed and stamped envelope, Emmi video, copy of consent form to read and not return, and instructions.   PV completed over the phone. Pt encouraged to call with questions or issues.

## 2020-10-05 ENCOUNTER — Telehealth: Payer: Self-pay | Admitting: Gastroenterology

## 2020-10-05 NOTE — Telephone Encounter (Signed)
CALLED PT- NO ANSWER- LMTRC- MARIE PV

## 2020-10-05 NOTE — Telephone Encounter (Signed)
PT STATES HE HAS HAD A CHANGE IN INSURANCE THAT HE WAS NOT AWARE OF- NOW STATES HE HAS BCBS- I INSTRUCTED PT I CANNOT CHANGE PREP UNTIL I KNOW FOR SURE WHAT INSURANCE HE HAS SO I CAN GET THE LEAST EXPENSIVE PREP FOR HIM. PT WILL CB AND INFORM us WHEN HE IS AWARE OF INSURANCE

## 2020-10-05 NOTE — Telephone Encounter (Signed)
Patient called regarding his prep medication states he was told it has a block due to insurance and is seeking advise. Michela Pitcher he has a call out to his case worker but in the mean time would like to discuss with a nurse.

## 2020-10-08 NOTE — Telephone Encounter (Signed)
Inbound call from patient stating he was able to pickup prep medication.

## 2020-10-11 ENCOUNTER — Telehealth: Payer: Self-pay

## 2020-10-11 NOTE — Telephone Encounter (Signed)
PA for Jardiance approved until 10/11/21

## 2020-10-18 ENCOUNTER — Other Ambulatory Visit: Payer: Self-pay

## 2020-10-18 ENCOUNTER — Encounter: Payer: Self-pay | Admitting: Gastroenterology

## 2020-10-18 ENCOUNTER — Ambulatory Visit (AMBULATORY_SURGERY_CENTER): Payer: BLUE CROSS/BLUE SHIELD | Admitting: Gastroenterology

## 2020-10-18 VITALS — BP 114/62 | HR 75 | Temp 97.1°F | Resp 14 | Ht 73.0 in | Wt 311.0 lb

## 2020-10-18 DIAGNOSIS — Z1211 Encounter for screening for malignant neoplasm of colon: Secondary | ICD-10-CM

## 2020-10-18 DIAGNOSIS — D123 Benign neoplasm of transverse colon: Secondary | ICD-10-CM | POA: Diagnosis not present

## 2020-10-18 MED ORDER — SODIUM CHLORIDE 0.9 % IV SOLN: 500.0000 mL | Freq: Once | INTRAVENOUS | Status: DC

## 2020-10-18 NOTE — Patient Instructions (Signed)
Discharge instructions given. Handouts on polyps,diverticulosis and hemorrhoids. Resume previous medications. YOU HAD AN ENDOSCOPIC PROCEDURE TODAY AT [facility]:   Refer to the procedure report that was given to you for any specific questions about what was found during the examination.  If the procedure report does not answer your questions, please call your gastroenterologist to clarify.  If you requested that your care partner not be given the details of your procedure findings, then the procedure report has been included in a sealed envelope for you to review at your convenience later.  YOU SHOULD EXPECT: Some feelings of bloating in the abdomen. Passage of more gas than usual.  Walking can help get rid of the air that was put into your GI tract during the procedure and reduce the bloating. If you had a lower endoscopy (such as a colonoscopy or flexible sigmoidoscopy) you may notice spotting of blood in your stool or on the toilet paper. If you underwent a bowel prep for your procedure, you may not have a normal bowel movement for a few days.  Please Note:  You might notice some irritation and congestion in your nose or some drainage.  This is from the oxygen used during your procedure.  There is no need for concern and it should clear up in a day or so.  SYMPTOMS TO REPORT IMMEDIATELY:  Following lower endoscopy (colonoscopy or flexible sigmoidoscopy):  Excessive amounts of blood in the stool  Significant tenderness or worsening of abdominal pains  Swelling of the abdomen that is new, acute  Fever of 100F or higher   For urgent or emergent issues, a gastroenterologist can be reached at any hour by calling [phone]. Do not use MyChart messaging for urgent concerns.    DIET:  We do recommend a small meal at first, but then you may proceed to your regular diet.  Drink plenty of fluids but you should avoid alcoholic beverages for 24 hours.  ACTIVITY:  You should  plan to take it easy for the rest of today and you should NOT DRIVE or use heavy machinery until tomorrow (because of the sedation medicines used during the test).    FOLLOW UP: Our staff will call the number listed on your records 48-72 hours following your procedure to check on you and address any questions or concerns that you may have regarding the information given to you following your procedure. If we do not reach you, we will leave a message.  We will attempt to reach you two times.  During this call, we will ask if you have developed any symptoms of COVID 19. If you develop any symptoms (ie: fever, flu-like symptoms, shortness of breath, cough etc.) before then, please call [phone].  If you test positive for Covid 19 in the 2 weeks post procedure, please call and report this information to [place].    If any biopsies were taken you will be contacted by phone or by letter within the next 1-3 weeks.  Please call us at [phone] if you have not heard about the biopsies in 3 weeks.    SIGNATURES/CONFIDENTIALITY: You and/or your care partner have signed paperwork which will be entered into your electronic medical record.  These signatures attest to the fact that that the information above on your After Visit Summary has been reviewed and is understood.  Full responsibility of the confidentiality of this discharge information lies with you and/or your care-partner.

## 2020-10-18 NOTE — Progress Notes (Signed)
To pacu, VSS. Report to [email or web address] 

## 2020-10-18 NOTE — Progress Notes (Signed)
Pt. Reports no change in his medical or surgical history since his pre-visit 10/04/2020.

## 2020-10-18 NOTE — Op Note (Signed)
[place] Patient Name: Bill Torres Procedure Date: 10/18/2020 11:39 AM MRN: [id number] Endoscopist: Jackquline Denmark , MD Age: 65 Referring MD:  Date of Birth: 10-04-1955 Gender: Male Account #: [id number] Procedure:                Colonoscopy Indications:              Screening for colorectal malignant neoplasm Medicines:                Monitored Anesthesia Care Procedure:                Pre-Anesthesia Assessment:                           - Prior to the procedure, a History and Physical                            was performed, and patient medications and                            allergies were reviewed. The patient's tolerance of                            previous anesthesia was also reviewed. The risks                            and benefits of the procedure and the sedation                            options and risks were discussed with the patient.                            All questions were answered, and informed consent                            was obtained. Prior Anticoagulants: The patient has                            taken no previous anticoagulant or antiplatelet                            agents. ASA Grade Assessment: III - A patient with                            severe systemic disease. After reviewing the risks                            and benefits, the patient was deemed in                            satisfactory condition to undergo the procedure.                           After obtaining informed consent, the colonoscope  was passed under direct vision. Throughout the                            procedure, the patient's blood pressure, pulse, and                            oxygen saturations were monitored continuously. The                            CF [id number] #[id number] was introduced through the anus                            and advanced to the the cecum, identified by                            appendiceal orifice  and ileocecal valve. The                            colonoscopy was performed without difficulty. The                            patient tolerated the procedure well. The quality                            of the bowel preparation was good. The ileocecal                            valve, appendiceal orifice, and rectum were                            photographed. Scope In: 11:40:26 AM Scope Out: 11:52:10 AM Scope Withdrawal Time: 0 hours 8 minutes 43 seconds  Total Procedure Duration: 0 hours 11 minutes 44 seconds  Findings:                 A 6 mm polyp was found in the proximal transverse                            colon. The polyp was sessile. The polyp was removed                            with a cold snare. Resection and retrieval were                            complete.                           There was a medium-sized lipoma, 1 cm in diameter,                            at the hepatic flexure.                           Multiple medium-mouthed diverticula were found in  the sigmoid colon, descending colon and ascending                            colon.                           Non-bleeding internal hemorrhoids were found during                            retroflexion. The hemorrhoids were small and Grade                            I (internal hemorrhoids that do not prolapse).                           The exam was otherwise without abnormality on                            direct and retroflexion views. Complications:            No immediate complications. Estimated Blood Loss:     Estimated blood loss: none. Impression:               - One 6 mm polyp in the proximal transverse colon,                            removed with a cold snare. Resected and retrieved.                           - Incidental hepatic flexure lipoma.                           - Pancolonic diverticulosis predominantly in the                            sigmoid colon.                            - Non-bleeding internal hemorrhoids.                           - The examination was otherwise normal on direct                            and retroflexion views. Recommendation:           - Patient has a contact number available for                            emergencies. The signs and symptoms of potential                            delayed complications were discussed with the                            patient. Return to normal activities tomorrow.  Written discharge instructions were provided to the                            patient.                           - High fiber diet.                           - Continue present medications.                           - Await pathology results.                           - Repeat colonoscopy for surveillance based on                            pathology results.                           - The findings and recommendations were discussed                            with the patient's family. Jackquline Denmark, MD 10/18/2020 11:57:55 AM This report has been signed electronically.

## 2020-10-18 NOTE — Progress Notes (Signed)
Called to room to assist during endoscopic procedure.  Patient ID and intended procedure confirmed with present staff. Received instructions for my participation in the procedure from the performing physician.  

## 2020-10-19 ENCOUNTER — Other Ambulatory Visit: Payer: Self-pay

## 2020-10-20 ENCOUNTER — Telehealth: Payer: Self-pay | Admitting: *Deleted

## 2020-10-20 ENCOUNTER — Telehealth: Payer: Self-pay

## 2020-10-20 NOTE — Telephone Encounter (Signed)
LVM

## 2020-10-20 NOTE — Telephone Encounter (Signed)
  Follow up Call-  Call back number 10/18/2020  Post procedure Call Back phone  # [phone]  Permission to leave phone message Yes     Patient questions: Message left to call us if necessary.

## 2020-10-21 ENCOUNTER — Encounter: Payer: Self-pay | Admitting: Gastroenterology

## 2020-10-22 ENCOUNTER — Other Ambulatory Visit: Payer: Self-pay | Admitting: Family Medicine

## 2020-10-22 DIAGNOSIS — Z794 Long term (current) use of insulin: Secondary | ICD-10-CM

## 2020-10-22 DIAGNOSIS — E1149 Type 2 diabetes mellitus with other diabetic neurological complication: Secondary | ICD-10-CM

## 2020-10-22 DIAGNOSIS — E1165 Type 2 diabetes mellitus with hyperglycemia: Secondary | ICD-10-CM

## 2020-10-22 MED ORDER — LANTUS SOLOSTAR 100 UNIT/ML ~~LOC~~ SOPN: 42.0000 [IU] | PEN_INJECTOR | Freq: Two times a day (BID) | SUBCUTANEOUS | 6 refills | Status: DC

## 2020-10-22 MED ORDER — TRUEPLUS PEN NEEDLES 31G X 8 MM MISC: 6 refills | Status: DC

## 2020-10-22 MED ORDER — GABAPENTIN 300 MG PO CAPS: 300.0000 mg | ORAL_CAPSULE | Freq: Two times a day (BID) | ORAL | 6 refills | Status: DC

## 2020-10-22 MED ORDER — ACCU-CHEK GUIDE W/DEVICE KIT: PACK | 0 refills | Status: DC

## 2020-10-22 MED ORDER — ROSUVASTATIN CALCIUM 40 MG PO TABS: ORAL_TABLET | Freq: Every day | ORAL | 6 refills | Status: DC

## 2020-10-22 MED ORDER — EMPAGLIFLOZIN 10 MG PO TABS
10.0000 mg | ORAL_TABLET | Freq: Every day | ORAL | 6 refills | Status: DC
Start: 2020-10-22 — End: 2021-03-01

## 2020-10-22 MED ORDER — ACCU-CHEK SOFTCLIX LANCETS MISC
2 refills | Status: DC
  Filled 2021-07-27: qty 100, 30d supply, fill #0

## 2020-11-19 ENCOUNTER — Other Ambulatory Visit: Payer: Self-pay | Admitting: Family Medicine

## 2020-11-19 DIAGNOSIS — E1169 Type 2 diabetes mellitus with other specified complication: Secondary | ICD-10-CM

## 2020-11-22 ENCOUNTER — Other Ambulatory Visit: Payer: Self-pay | Admitting: Family Medicine

## 2020-11-22 DIAGNOSIS — Z794 Long term (current) use of insulin: Secondary | ICD-10-CM

## 2020-11-22 DIAGNOSIS — E1169 Type 2 diabetes mellitus with other specified complication: Secondary | ICD-10-CM

## 2020-12-14 ENCOUNTER — Telehealth: Payer: Medicaid Other | Admitting: Family Medicine

## 2021-01-05 ENCOUNTER — Ambulatory Visit: Payer: Medicare Other | Admitting: Podiatry

## 2021-02-09 DIAGNOSIS — H2512 Age-related nuclear cataract, left eye: Secondary | ICD-10-CM | POA: Diagnosis not present

## 2021-02-11 DIAGNOSIS — H25812 Combined forms of age-related cataract, left eye: Secondary | ICD-10-CM | POA: Diagnosis not present

## 2021-03-01 ENCOUNTER — Encounter: Payer: Self-pay | Admitting: Family Medicine

## 2021-03-01 ENCOUNTER — Ambulatory Visit: Payer: Medicare Other | Attending: Family Medicine | Admitting: Family Medicine

## 2021-03-01 ENCOUNTER — Other Ambulatory Visit: Payer: Self-pay

## 2021-03-01 VITALS — BP 132/86 | HR 85 | Ht 73.0 in | Wt 305.0 lb

## 2021-03-01 DIAGNOSIS — E1169 Type 2 diabetes mellitus with other specified complication: Secondary | ICD-10-CM | POA: Diagnosis not present

## 2021-03-01 DIAGNOSIS — Z794 Long term (current) use of insulin: Secondary | ICD-10-CM | POA: Diagnosis not present

## 2021-03-01 DIAGNOSIS — E785 Hyperlipidemia, unspecified: Secondary | ICD-10-CM | POA: Diagnosis not present

## 2021-03-01 DIAGNOSIS — N200 Calculus of kidney: Secondary | ICD-10-CM | POA: Diagnosis not present

## 2021-03-01 DIAGNOSIS — E1140 Type 2 diabetes mellitus with diabetic neuropathy, unspecified: Secondary | ICD-10-CM | POA: Insufficient documentation

## 2021-03-01 DIAGNOSIS — E1149 Type 2 diabetes mellitus with other diabetic neurological complication: Secondary | ICD-10-CM | POA: Diagnosis not present

## 2021-03-01 LAB — POCT GLYCOSYLATED HEMOGLOBIN (HGB A1C): HbA1c, POC (controlled diabetic range): 6.6 % (ref 0.0–7.0)

## 2021-03-01 LAB — GLUCOSE, POCT (MANUAL RESULT ENTRY): POC Glucose: 148 mg/dl — AB (ref 70–99)

## 2021-03-01 MED ORDER — LANTUS SOLOSTAR 100 UNIT/ML ~~LOC~~ SOPN
38.0000 [IU] | PEN_INJECTOR | Freq: Two times a day (BID) | SUBCUTANEOUS | 6 refills | Status: DC
  Filled 2021-03-01: qty 24, 32d supply, fill #0
  Filled 2021-03-31: qty 24, 32d supply, fill #1
  Filled 2021-04-29: qty 24, 32d supply, fill #2
  Filled 2021-04-29 (×2): qty 24, 32d supply, fill #0
  Filled 2021-05-30: qty 24, 32d supply, fill #1
  Filled 2021-06-26: qty 24, 32d supply, fill #2
  Filled 2021-07-26: qty 24, 32d supply, fill #3
  Filled 2021-08-21: qty 24, 32d supply, fill #4

## 2021-03-01 MED ORDER — EMPAGLIFLOZIN 10 MG PO TABS
10.0000 mg | ORAL_TABLET | Freq: Every day | ORAL | 6 refills | Status: DC
  Filled 2021-03-01: qty 30, 30d supply, fill #0
  Filled 2021-03-31: qty 30, 30d supply, fill #1
  Filled 2021-04-29: qty 30, 30d supply, fill #2
  Filled 2021-04-29 (×2): qty 30, 30d supply, fill #0
  Filled 2021-05-30: qty 30, 30d supply, fill #1
  Filled 2021-06-28: qty 30, 30d supply, fill #2
  Filled 2021-07-26: qty 30, 30d supply, fill #3

## 2021-03-01 MED ORDER — ROSUVASTATIN CALCIUM 40 MG PO TABS
ORAL_TABLET | Freq: Every day | ORAL | 6 refills | Status: DC
  Filled 2021-03-01: qty 30, 30d supply, fill #0
  Filled 2021-03-31: qty 30, 30d supply, fill #1
  Filled 2021-04-29: qty 30, 30d supply, fill #0
  Filled 2021-04-29: qty 30, 30d supply, fill #2
  Filled 2021-04-29: qty 30, 30d supply, fill #0
  Filled 2021-05-30: qty 30, 30d supply, fill #1
  Filled 2021-06-28: qty 30, 30d supply, fill #2
  Filled 2021-07-26: qty 30, 30d supply, fill #3

## 2021-03-01 MED ORDER — GABAPENTIN 300 MG PO CAPS
300.0000 mg | ORAL_CAPSULE | Freq: Two times a day (BID) | ORAL | 6 refills | Status: DC
  Filled 2021-03-01: qty 60, 30d supply, fill #0
  Filled 2021-03-31: qty 60, 30d supply, fill #1
  Filled 2021-04-29 (×2): qty 60, 30d supply, fill #0
  Filled 2021-04-29: qty 60, 30d supply, fill #2
  Filled 2021-05-30: qty 60, 30d supply, fill #1
  Filled 2021-06-29: qty 60, 30d supply, fill #2
  Filled 2021-07-31: qty 60, 30d supply, fill #3

## 2021-03-01 NOTE — Patient Instructions (Signed)

## 2021-03-01 NOTE — Assessment & Plan Note (Signed)
Controlled with A1c of 6.6; goal is less than 7.0. No hypoglycemic episodes He would like to cut back on his dose of Lantus Reduce Lantus to 38 units twice daily and he has been advised that if blood sugars are elevated he needs to uptitrate by 2 units twice daily and if hypoglycemia occurs he can reduce by 2 units twice daily as well Counseled on Diabetic diet, my plate method, 395 minutes of moderate intensity exercise/week Blood sugar logs with fasting goals of 80-120 mg/dl, random of less than 180 and in the event of sugars less than 60 mg/dl or greater than 400 mg/dl encouraged to notify the clinic. Advised on the need for annual eye exams, annual foot exams, Pneumonia vaccine.

## 2021-03-01 NOTE — Progress Notes (Signed)
Subjective:  Patient ID: Bill Torres, male    DOB: 09-17-1955  Age: 65 y.o. MRN: [id number]  CC: Diabetes   HPI Bill Torres is a 65 y.o. year old male with a history of obesity, type 2 diabetes mellitus (A1c 6.6 diagnosed in 11/2019), hyperlipidemia who presents today  for chronic disease management.  Interval History: He has had 2 cataract surgeries since his last visit Also saw Urology for renal calculi and had urology notes reviewed with CT scan revealing interval passage of left ureteral calculus and resolution of previous left hydronephrosis but presence of 2 mm nonobstructing right upper pole calculus.  He denies presence of flank pain, hematuria.  Ran out of insulin and has been taking it daily rather than bid. He has also been out of all his other medications for the last 1.5 weeks He is of the opinion that the insulin caused him to have kidney stones. Compliant with his statin. Denies additional concerns today. Past Medical History:  Diagnosis Date   Cataract    Dental abscess 12/15/2019   Hyperlipidemia associated with type 2 diabetes mellitus ([place]) 12/14/2019    Past Surgical History:  Procedure Laterality Date   WISDOM TOOTH EXTRACTION      Family History  Problem Relation Age of Onset   Colon cancer Neg Hx    Esophageal cancer Neg Hx    Rectal cancer Neg Hx    Stomach cancer Neg Hx    Colon polyps Neg Hx     No Known Allergies  Outpatient Medications Prior to Visit  Medication Sig Dispense Refill   Accu-Chek Softclix Lancets lancets Use to check blood sugar TID. 100 each 2   Blood Glucose Monitoring Suppl (ACCU-CHEK GUIDE) w/Device KIT Use to check blood sugar TID. 1 kit 0   glucose blood (ACCU-CHEK GUIDE) test strip USE TO TEST BLOOD SUGAR THREE TIMES A DAY 100 strip 2   Insulin Pen Needle (PEN NEEDLES) 31G X 8 MM MISC Use as directed with insulin pen 100 each 3   Insulin Pen Needle (TRUEPLUS PEN NEEDLES) 31G X 8 MM MISC USE AS  DIRECTED WITH INSULIN PEN 100 each 6   ondansetron (ZOFRAN) 4 MG tablet Take 1 tablet (4 mg total) by mouth every 8 (eight) hours as needed. 12 tablet 0   oxyCODONE-acetaminophen (PERCOCET/ROXICET) 5-325 MG tablet Take 1 tablet by mouth every 4 (four) hours as needed for severe pain. 15 tablet 0   empagliflozin (JARDIANCE) 10 MG TABS tablet Take 1 tablet (10 mg total) by mouth daily. 30 tablet 6   gabapentin (NEURONTIN) 300 MG capsule Take 1 capsule (300 mg total) by mouth 2 (two) times daily. 60 capsule 6   insulin glargine (LANTUS SOLOSTAR) 100 UNIT/ML Solostar Pen Inject 42 Units into the skin 2 (two) times daily. 30 mL 6   rosuvastatin (CRESTOR) 40 MG tablet TAKE 1 TABLET (40 MG TOTAL) BY MOUTH DAILY. 30 tablet 6   No facility-administered medications prior to visit.     ROS Review of Systems  Constitutional:  Negative for activity change and appetite change.  HENT:  Negative for sinus pressure and sore throat.   Eyes:  Negative for visual disturbance.  Respiratory:  Negative for cough, chest tightness and shortness of breath.   Cardiovascular:  Negative for chest pain and leg swelling.  Gastrointestinal:  Negative for abdominal distention, abdominal pain, constipation and diarrhea.  Endocrine: Negative.   Genitourinary:  Negative for dysuria.  Musculoskeletal:  Negative for joint swelling  and myalgias.  Skin:  Negative for rash.  Allergic/Immunologic: Negative.   Neurological:  Negative for weakness, light-headedness and numbness.  Psychiatric/Behavioral:  Negative for dysphoric mood and suicidal ideas.    Objective:  BP 132/86   Pulse 85   Ht 6' 1"  (1.854 m)   Wt (!) 305 lb (138.3 kg)   SpO2 99%   BMI 40.24 kg/m   BP/Weight 03/01/2021 10/18/2020 12/05/4816  Systolic BP 563 149 -  Diastolic BP 86 62 -  Wt. (Lbs) 305 311 311  BMI 40.24 41.03 41.03      Physical Exam Constitutional:      Appearance: He is well-developed. He is obese.  Cardiovascular:     Rate and  Rhythm: Normal rate.     Heart sounds: Normal heart sounds. No murmur heard. Pulmonary:     Effort: Pulmonary effort is normal.     Breath sounds: Normal breath sounds. No wheezing or rales.  Chest:     Chest wall: No tenderness.  Abdominal:     General: Bowel sounds are normal. There is no distension.     Palpations: Abdomen is soft. There is no mass.     Tenderness: There is no abdominal tenderness.  Musculoskeletal:        General: Normal range of motion.     Right lower leg: No edema.     Left lower leg: No edema.  Neurological:     Mental Status: He is alert and oriented to person, place, and time.  Psychiatric:        Mood and Affect: Mood normal.    CMP Latest Ref Rng & Units 09/13/2020 06/16/2020 01/07/2020  Glucose 70 - 99 mg/dL 143(H) 91 183(H)  BUN 8 - 23 mg/dL 8 13 4(L)  Creatinine 0.61 - 1.24 mg/dL 1.01 0.95 0.74(L)  Sodium 135 - 145 mmol/L 139 142 141  Potassium 3.5 - 5.1 mmol/L 3.4(L) 4.5 4.4  Chloride 98 - 111 mmol/L 107 101 102  CO2 22 - 32 mmol/L 24 22 26   Calcium 8.9 - 10.3 mg/dL 8.8(L) 9.8 9.0  Total Protein 6.5 - 8.1 g/dL 7.5 8.2 7.0  Total Bilirubin 0.3 - 1.2 mg/dL 1.1 0.6 0.7  Alkaline Phos 38 - 126 U/L 88 131(H) 144(H)  AST 15 - 41 U/L 27 21 31   ALT 0 - 44 U/L 33 21 41    Lipid Panel     Component Value Date/Time   CHOL 241 (H) 06/16/2020 1130   TRIG 337 (H) 06/16/2020 1130   HDL 31 (L) 06/16/2020 1130   CHOLHDL 7.8 (H) 06/16/2020 1130   CHOLHDL 9.2 12/13/2019 1647   VLDL 60 (H) 12/13/2019 1647   LDLCALC 147 (H) 06/16/2020 1130    CBC    Component Value Date/Time   WBC 10.6 (H) 09/13/2020 0754   RBC 5.11 09/13/2020 0754   HGB 15.6 09/13/2020 0754   HGB 14.4 01/07/2020 1111   HCT 45.9 09/13/2020 0754   HCT 44.1 01/07/2020 1111   PLT 232 09/13/2020 0754   PLT 235 01/07/2020 1111   MCV 89.8 09/13/2020 0754   MCV 90 01/07/2020 1111   MCH 30.5 09/13/2020 0754   MCHC 34.0 09/13/2020 0754   RDW 13.3 09/13/2020 0754   RDW 12.8 01/07/2020  1111   LYMPHSABS 2.6 01/07/2020 1111   MONOABS 1.0 12/13/2019 1647   EOSABS 0.3 01/07/2020 1111   BASOSABS 0.0 01/07/2020 1111    Lab Results  Component Value Date   HGBA1C 6.6 03/01/2021  Assessment & Plan:   Problem List Items Addressed This Visit       Endocrine   Diabetes mellitus ([place]) - Primary    Controlled with A1c of 6.6; goal is less than 7.0. No hypoglycemic episodes He would like to cut back on his dose of Lantus Reduce Lantus to 38 units twice daily and he has been advised that if blood sugars are elevated he needs to uptitrate by 2 units twice daily and if hypoglycemia occurs he can reduce by 2 units twice daily as well Counseled on Diabetic diet, my plate method, 474 minutes of moderate intensity exercise/week Blood sugar logs with fasting goals of 80-120 mg/dl, random of less than 180 and in the event of sugars less than 60 mg/dl or greater than 400 mg/dl encouraged to notify the clinic. Advised on the need for annual eye exams, annual foot exams, Pneumonia vaccine.       Relevant Medications   insulin glargine (LANTUS SOLOSTAR) 100 UNIT/ML Solostar Pen   empagliflozin (JARDIANCE) 10 MG TABS tablet   rosuvastatin (CRESTOR) 40 MG tablet   Other Relevant Orders   POCT glucose (manual entry) (Completed)   POCT glycosylated hemoglobin (Hb A1C) (Completed)   LP+Non-HDL Cholesterol   CMP14+EGFR   Microalbumin / creatinine urine ratio   Hyperlipidemia associated with type 2 diabetes mellitus ([place])    Uncontrolled from lipid panel of 05/2020 He is due for lipid panel which has been ordered meanwhile he will continue with his current dose of statin which will be adjusted if cholesterol remains elevated Low-cholesterol diet      Relevant Medications   insulin glargine (LANTUS SOLOSTAR) 100 UNIT/ML Solostar Pen   empagliflozin (JARDIANCE) 10 MG TABS tablet   rosuvastatin (CRESTOR) 40 MG tablet   Diabetic neuropathy ([facility])    Controlled on gabapentin       Relevant Medications   insulin glargine (LANTUS SOLOSTAR) 100 UNIT/ML Solostar Pen   empagliflozin (JARDIANCE) 10 MG TABS tablet   gabapentin (NEURONTIN) 300 MG capsule   rosuvastatin (CRESTOR) 40 MG tablet     Genitourinary   Right nephrolithiasis    Asymptomatic Advised to increase oral hydration Counseled that it is unlikely that his nephrolithiasis is as a result of his insulin use Follow-up with [facility] urology         Meds ordered this encounter  Medications   insulin glargine (LANTUS SOLOSTAR) 100 UNIT/ML Solostar Pen    Sig: Inject 38 Units into the skin 2 (two) times daily.    Dispense:  30 mL    Refill:  6    Dose decrease   empagliflozin (JARDIANCE) 10 MG TABS tablet    Sig: Take 1 tablet (10 mg total) by mouth daily.    Dispense:  30 tablet    Refill:  6   gabapentin (NEURONTIN) 300 MG capsule    Sig: Take 1 capsule (300 mg total) by mouth 2 (two) times daily.    Dispense:  60 capsule    Refill:  6   rosuvastatin (CRESTOR) 40 MG tablet    Sig: TAKE 1 TABLET (40 MG TOTAL) BY MOUTH DAILY.    Dispense:  30 tablet    Refill:  6     Follow-up: Return in about 6 months (around 08/29/2021) for Chronic medical conditions.       Charlott Rakes, MD, FAAFP. [facility] [place], [place]   03/01/2021, 5:26 PM

## 2021-03-01 NOTE — Progress Notes (Signed)
Discuss insulin.

## 2021-03-01 NOTE — Assessment & Plan Note (Signed)
Controlled on gabapentin.

## 2021-03-01 NOTE — Assessment & Plan Note (Addendum)
Asymptomatic Advised to increase oral hydration Counseled that it is unlikely that his nephrolithiasis is as a result of his insulin use Follow-up with [facility] urology

## 2021-03-01 NOTE — Assessment & Plan Note (Signed)
Uncontrolled from lipid panel of 05/2020 He is due for lipid panel which has been ordered meanwhile he will continue with his current dose of statin which will be adjusted if cholesterol remains elevated Low-cholesterol diet

## 2021-03-08 ENCOUNTER — Ambulatory Visit: Payer: Medicare Other | Attending: Family Medicine

## 2021-03-08 ENCOUNTER — Other Ambulatory Visit: Payer: Self-pay

## 2021-03-08 DIAGNOSIS — E1169 Type 2 diabetes mellitus with other specified complication: Secondary | ICD-10-CM

## 2021-03-08 DIAGNOSIS — Z794 Long term (current) use of insulin: Secondary | ICD-10-CM

## 2021-03-09 LAB — CMP14+EGFR
ALT: 33 IU/L (ref 0–44)
AST: 25 IU/L (ref 0–40)
Albumin/Globulin Ratio: 1.3 (ref 1.2–2.2)
Albumin: 4.2 g/dL (ref 3.8–4.8)
Alkaline Phosphatase: 133 IU/L — ABNORMAL HIGH (ref 44–121)
BUN/Creatinine Ratio: 11 (ref 10–24)
BUN: 9 mg/dL (ref 8–27)
Bilirubin Total: 0.9 mg/dL (ref 0.0–1.2)
CO2: 24 mmol/L (ref 20–29)
Calcium: 9.3 mg/dL (ref 8.6–10.2)
Chloride: 102 mmol/L (ref 96–106)
Creatinine, Ser: 0.82 mg/dL (ref 0.76–1.27)
Globulin, Total: 3.3 g/dL (ref 1.5–4.5)
Glucose: 159 mg/dL — ABNORMAL HIGH (ref 70–99)
Potassium: 4.7 mmol/L (ref 3.5–5.2)
Sodium: 142 mmol/L (ref 134–144)
Total Protein: 7.5 g/dL (ref 6.0–8.5)
eGFR: 97 mL/min/{1.73_m2} (ref >59)

## 2021-03-09 LAB — LP+NON-HDL CHOLESTEROL
Cholesterol, Total: 135 mg/dL (ref 100–199)
HDL: 30 mg/dL — ABNORMAL LOW (ref >39)
LDL Chol Calc (NIH): 73 mg/dL (ref 0–99)
Total Non-HDL-Chol (LDL+VLDL): 105 mg/dL (ref 0–129)
Triglycerides: 186 mg/dL — ABNORMAL HIGH (ref 0–149)
VLDL Cholesterol Cal: 32 mg/dL (ref 5–40)

## 2021-03-09 LAB — MICROALBUMIN / CREATININE URINE RATIO
Creatinine, Urine: 62.9 mg/dL
Microalb/Creat Ratio: <5 mg/g creat (ref 0–29)
Microalbumin, Urine: <3 ug/mL

## 2021-03-11 ENCOUNTER — Telehealth: Payer: Self-pay

## 2021-03-11 NOTE — Telephone Encounter (Signed)
-----   Message from Charlott Rakes, MD sent at 03/10/2021 12:13 PM EST ----- Please inform the patient that labs are stable.

## 2021-03-11 NOTE — Telephone Encounter (Signed)
Patient name and DOB has been verified Patient was informed of lab results. Patient had no questions.  

## 2021-03-31 ENCOUNTER — Other Ambulatory Visit: Payer: Self-pay

## 2021-04-29 ENCOUNTER — Other Ambulatory Visit (HOSPITAL_COMMUNITY): Payer: Self-pay

## 2021-04-29 ENCOUNTER — Other Ambulatory Visit: Payer: Self-pay

## 2021-04-29 ENCOUNTER — Other Ambulatory Visit: Payer: Self-pay | Admitting: Physician Assistant

## 2021-04-29 MED ORDER — TRUEPLUS 5-BEVEL PEN NEEDLES 31G X 8 MM MISC
3 refills | Status: DC
  Filled 2021-04-29: qty 100, fill #0
  Filled 2021-05-02: qty 100, 30d supply, fill #0
  Filled 2021-06-17: qty 100, 30d supply, fill #1
  Filled 2021-06-17: qty 100, 30d supply, fill #0
  Filled 2021-07-26: qty 100, 30d supply, fill #1
  Filled 2021-09-26: qty 100, 30d supply, fill #2

## 2021-05-02 ENCOUNTER — Other Ambulatory Visit: Payer: Self-pay

## 2021-05-23 ENCOUNTER — Other Ambulatory Visit (HOSPITAL_COMMUNITY): Payer: Self-pay

## 2021-05-30 ENCOUNTER — Other Ambulatory Visit: Payer: Self-pay

## 2021-05-30 ENCOUNTER — Other Ambulatory Visit (HOSPITAL_COMMUNITY): Payer: Self-pay

## 2021-05-31 ENCOUNTER — Other Ambulatory Visit: Payer: Self-pay

## 2021-06-06 ENCOUNTER — Other Ambulatory Visit (HOSPITAL_COMMUNITY): Payer: Self-pay

## 2021-06-17 ENCOUNTER — Other Ambulatory Visit: Payer: Self-pay

## 2021-06-17 ENCOUNTER — Other Ambulatory Visit (HOSPITAL_COMMUNITY): Payer: Self-pay

## 2021-06-27 ENCOUNTER — Other Ambulatory Visit: Payer: Self-pay

## 2021-06-28 ENCOUNTER — Other Ambulatory Visit: Payer: Self-pay

## 2021-06-29 ENCOUNTER — Other Ambulatory Visit: Payer: Self-pay

## 2021-06-30 ENCOUNTER — Other Ambulatory Visit (HOSPITAL_COMMUNITY): Payer: Self-pay

## 2021-07-01 ENCOUNTER — Other Ambulatory Visit: Payer: Self-pay

## 2021-07-26 ENCOUNTER — Other Ambulatory Visit: Payer: Self-pay

## 2021-07-27 ENCOUNTER — Other Ambulatory Visit: Payer: Self-pay

## 2021-07-27 MED FILL — Glucose Blood Test Strip: 30 days supply | Qty: 100 | Fill #0 | Status: AC

## 2021-08-01 ENCOUNTER — Other Ambulatory Visit: Payer: Self-pay

## 2021-08-22 ENCOUNTER — Other Ambulatory Visit: Payer: Self-pay

## 2021-08-23 ENCOUNTER — Other Ambulatory Visit: Payer: Self-pay

## 2021-08-29 ENCOUNTER — Ambulatory Visit: Payer: Medicare Other | Attending: Family Medicine | Admitting: Family Medicine

## 2021-08-29 ENCOUNTER — Encounter: Payer: Self-pay | Admitting: Family Medicine

## 2021-08-29 ENCOUNTER — Other Ambulatory Visit: Payer: Self-pay

## 2021-08-29 VITALS — BP 137/76 | HR 87 | Ht 73.0 in | Wt 310.0 lb

## 2021-08-29 DIAGNOSIS — E1169 Type 2 diabetes mellitus with other specified complication: Secondary | ICD-10-CM | POA: Diagnosis not present

## 2021-08-29 DIAGNOSIS — Z794 Long term (current) use of insulin: Secondary | ICD-10-CM | POA: Diagnosis not present

## 2021-08-29 DIAGNOSIS — E785 Hyperlipidemia, unspecified: Secondary | ICD-10-CM

## 2021-08-29 DIAGNOSIS — K0889 Other specified disorders of teeth and supporting structures: Secondary | ICD-10-CM | POA: Diagnosis not present

## 2021-08-29 DIAGNOSIS — E1149 Type 2 diabetes mellitus with other diabetic neurological complication: Secondary | ICD-10-CM

## 2021-08-29 LAB — POCT GLYCOSYLATED HEMOGLOBIN (HGB A1C): HbA1c, POC (controlled diabetic range): 6.9 % (ref 0.0–7.0)

## 2021-08-29 LAB — GLUCOSE, POCT (MANUAL RESULT ENTRY): POC Glucose: 159 mg/dl — AB (ref 70–99)

## 2021-08-29 MED ORDER — ROSUVASTATIN CALCIUM 40 MG PO TABS
ORAL_TABLET | Freq: Every day | ORAL | 1 refills | Status: DC
Start: 2021-08-29 — End: 2022-02-13
  Filled 2021-08-29: qty 90, 90d supply, fill #0
  Filled 2021-12-03: qty 90, 90d supply, fill #1

## 2021-08-29 MED ORDER — GABAPENTIN 300 MG PO CAPS
600.0000 mg | ORAL_CAPSULE | Freq: Two times a day (BID) | ORAL | 6 refills | Status: DC
Start: 2021-08-29 — End: 2022-03-06
  Filled 2021-08-29: qty 120, 30d supply, fill #0
  Filled 2021-09-30: qty 120, 30d supply, fill #1
  Filled 2021-11-02: qty 120, 30d supply, fill #2
  Filled 2021-12-03: qty 120, 30d supply, fill #3
  Filled 2022-01-04: qty 120, 30d supply, fill #4
  Filled 2022-02-12: qty 120, 30d supply, fill #5

## 2021-08-29 MED ORDER — OZEMPIC (0.25 OR 0.5 MG/DOSE) 2 MG/1.5ML ~~LOC~~ SOPN
0.2500 mg | PEN_INJECTOR | SUBCUTANEOUS | 6 refills | Status: DC
Start: 2021-08-29 — End: 2021-09-26
  Filled 2021-08-29: qty 1.5, 56d supply, fill #0

## 2021-08-29 MED ORDER — LANTUS SOLOSTAR 100 UNIT/ML ~~LOC~~ SOPN
36.0000 [IU] | PEN_INJECTOR | Freq: Two times a day (BID) | SUBCUTANEOUS | 6 refills | Status: DC
  Filled 2021-08-29 – 2021-09-24 (×2): qty 30, 42d supply, fill #0
  Filled 2021-11-02: qty 30, 42d supply, fill #1
  Filled 2021-12-18: qty 30, 42d supply, fill #2
  Filled 2022-01-27: qty 30, 42d supply, fill #3

## 2021-08-29 MED ORDER — EMPAGLIFLOZIN 10 MG PO TABS
10.0000 mg | ORAL_TABLET | Freq: Every day | ORAL | 1 refills | Status: DC
  Filled 2021-08-29: qty 90, 90d supply, fill #0
  Filled 2021-12-03: qty 90, 90d supply, fill #1

## 2021-08-29 NOTE — Patient Instructions (Signed)

## 2021-08-29 NOTE — Progress Notes (Signed)
Subjective:  Patient ID: Bill Torres, male    DOB: 1955/08/08  Age: 66 y.o. MRN: [id number]  CC: Diabetes   HPI Bill Torres is a 66 y.o. year old male with a history of obesity, type 2 diabetes mellitus (A1c 6.9 diagnosed in 11/2019), hyperlipidemia here for chronic disease management  Interval History: Fasting blood sugar was 98 this morning and he has no hypoglycemic episodes or visual concerns.  He is up-to-date on annual eye exams. Numbness occurs in his feet which is not fully controlled on his current dose of gabapentin. Compliant with his statin low-cholesterol diet He has got a couple of loose teeth and would like a referral to a dentist for evaluation for implants.  Past Medical History:  Diagnosis Date   Cataract    Dental abscess 12/15/2019   Hyperlipidemia associated with type 2 diabetes mellitus (HCC) 12/14/2019    Past Surgical History:  Procedure Laterality Date   WISDOM TOOTH EXTRACTION      Family History  Problem Relation Age of Onset   Colon cancer Neg Hx    Esophageal cancer Neg Hx    Rectal cancer Neg Hx    Stomach cancer Neg Hx    Colon polyps Neg Hx     Social History   Socioeconomic History   Marital status: Single    Spouse name: Not on file   Number of children: Not on file   Years of education: Not on file   Highest education level: Not on file  Occupational History   Not on file  Tobacco Use   Smoking status: Never   Smokeless tobacco: Never  Substance and Sexual Activity   Alcohol use: Never   Drug use: Never   Sexual activity: Not on file  Other Topics Concern   Not on file  Social History Narrative   Not on file   Social Determinants of Health   Financial Resource Strain: Not on file  Food Insecurity: Not on file  Transportation Needs: Not on file  Physical Activity: Not on file  Stress: Not on file  Social Connections: Not on file    No Known Allergies  Outpatient Medications Prior to Visit   Medication Sig Dispense Refill   Accu-Chek Softclix Lancets lancets Use to check blood sugar three times a day 100 each 2   Blood Glucose Monitoring Suppl (ACCU-CHEK GUIDE) w/Device KIT Use to check blood sugar TID. 1 kit 0   glucose blood (ACCU-CHEK GUIDE) test strip USE TO TEST BLOOD SUGAR THREE TIMES A DAY 100 strip 2   Insulin Pen Needle (TRUEPLUS 5-BEVEL PEN NEEDLES) 31G X 8 MM MISC See admin instructions. 100 each 3   empagliflozin (JARDIANCE) 10 MG TABS tablet Take 1 tablet (10 mg total) by mouth daily. 30 tablet 6   gabapentin (NEURONTIN) 300 MG capsule Take 1 capsule (300 mg total) by mouth 2 (two) times daily. 60 capsule 6   insulin glargine (LANTUS SOLOSTAR) 100 UNIT/ML Solostar Pen Inject 38 Units into the skin 2 (two) times daily. 30 mL 6   rosuvastatin (CRESTOR) 40 MG tablet TAKE 1 TABLET (40 MG TOTAL) BY MOUTH DAILY. 30 tablet 6   ondansetron (ZOFRAN) 4 MG tablet Take 1 tablet (4 mg total) by mouth every 8 (eight) hours as needed. (Patient not taking: Reported on 08/29/2021) 12 tablet 0   oxyCODONE-acetaminophen (PERCOCET/ROXICET) 5-325 MG tablet Take 1 tablet by mouth every 4 (four) hours as needed for severe pain. (Patient not taking: Reported on  08/29/2021) 15 tablet 0   No facility-administered medications prior to visit.     ROS Review of Systems  Constitutional:  Negative for activity change and appetite change.  HENT:  Positive for dental problem. Negative for sinus pressure and sore throat.   Eyes:  Negative for visual disturbance.  Respiratory:  Negative for cough, chest tightness and shortness of breath.   Cardiovascular:  Negative for chest pain and leg swelling.  Gastrointestinal:  Negative for abdominal distention, abdominal pain, constipation and diarrhea.  Endocrine: Negative.   Genitourinary:  Negative for dysuria.  Musculoskeletal:  Negative for joint swelling and myalgias.  Skin:  Negative for rash.  Allergic/Immunologic: Negative.   Neurological:  Positive  for numbness. Negative for weakness and light-headedness.  Psychiatric/Behavioral:  Negative for dysphoric mood and suicidal ideas.    Objective:  BP 137/76   Pulse 87   Ht 6\' 1"  (1.854 m)   Wt (!) 310 lb (140.6 kg)   SpO2 97%   BMI 40.90 kg/m      08/29/2021    9:24 AM 03/01/2021    1:38 PM 10/18/2020   12:17 PM  BP/Weight  Systolic BP 137 132 114  Diastolic BP 76 86 62  Wt. (Lbs) 310 305   BMI 40.9 kg/m2 40.24 kg/m2       Physical Exam Constitutional:      Appearance: He is well-developed. He is obese.  Cardiovascular:     Rate and Rhythm: Normal rate.     Heart sounds: Normal heart sounds. No murmur heard. Pulmonary:     Effort: Pulmonary effort is normal.     Breath sounds: Normal breath sounds. No wheezing or rales.  Chest:     Chest wall: No tenderness.  Abdominal:     General: Bowel sounds are normal. There is no distension.     Palpations: Abdomen is soft. There is no mass.     Tenderness: There is no abdominal tenderness.  Musculoskeletal:        General: Normal range of motion.     Right lower leg: No edema.     Left lower leg: No edema.  Neurological:     Mental Status: He is alert and oriented to person, place, and time.  Psychiatric:        Mood and Affect: Mood normal.       Latest Ref Rng & Units 03/08/2021    8:49 AM 09/13/2020    7:54 AM 06/16/2020   11:30 AM  CMP  Glucose 70 - 99 mg/dL 161   096   91    BUN 8 - 27 mg/dL 9   8   13     Creatinine 0.76 - 1.27 mg/dL 0.45   4.09   8.11    Sodium 134 - 144 mmol/L 142   139   142    Potassium 3.5 - 5.2 mmol/L 4.7   3.4   4.5    Chloride 96 - 106 mmol/L 102   107   101    CO2 20 - 29 mmol/L 24   24   22     Calcium 8.6 - 10.2 mg/dL 9.3   8.8   9.8    Total Protein 6.0 - 8.5 g/dL 7.5   7.5   8.2    Total Bilirubin 0.0 - 1.2 mg/dL 0.9   1.1   0.6    Alkaline Phos 44 - 121 IU/L 133   88   131    AST 0 -  40 IU/L 25   27   21     ALT 0 - 44 IU/L 33   33   21      Lipid Panel     Component Value  Date/Time   CHOL 135 03/08/2021 0849   TRIG 186 (H) 03/08/2021 0849   HDL 30 (L) 03/08/2021 0849   CHOLHDL 7.8 (H) 06/16/2020 1130   CHOLHDL 9.2 12/13/2019 1647   VLDL 60 (H) 12/13/2019 1647   LDLCALC 73 03/08/2021 0849    CBC    Component Value Date/Time   WBC 10.6 (H) 09/13/2020 0754   RBC 5.11 09/13/2020 0754   HGB 15.6 09/13/2020 0754   HGB 14.4 01/07/2020 1111   HCT 45.9 09/13/2020 0754   HCT 44.1 01/07/2020 1111   PLT 232 09/13/2020 0754   PLT 235 01/07/2020 1111   MCV 89.8 09/13/2020 0754   MCV 90 01/07/2020 1111   MCH 30.5 09/13/2020 0754   MCHC 34.0 09/13/2020 0754   RDW 13.3 09/13/2020 0754   RDW 12.8 01/07/2020 1111   LYMPHSABS 2.6 01/07/2020 1111   MONOABS 1.0 12/13/2019 1647   EOSABS 0.3 01/07/2020 1111   BASOSABS 0.0 01/07/2020 1111    Lab Results  Component Value Date   HGBA1C 6.9 08/29/2021    Assessment & Plan:  1. Type 2 diabetes mellitus with other specified complication, with long-term current use of insulin (HCC) Controlled with A1c of 6.9 We will add on GLP-1 RA due to cardiovascular and weight loss benefit and after shared decision making patient is agreeable to begin this medication Goal is to titrate this further at his next visit while decreasing dose of Lantus I have decrease Lantus from 38 units to 36 units twice daily Counseled on Diabetic diet, my plate method, 604 minutes of moderate intensity exercise/week Blood sugar logs with fasting goals of 80-120 mg/dl, random of less than 540 and in the event of sugars less than 60 mg/dl or greater than 981 mg/dl encouraged to notify the clinic. Advised on the need for annual eye exams, annual foot exams, Pneumonia vaccine. - POCT glucose (manual entry) - POCT glycosylated hemoglobin (Hb A1C) - Semaglutide,0.25 or 0.5MG /DOS, (OZEMPIC, 0.25 OR 0.5 MG/DOSE,) 2 MG/1.5ML SOPN; Inject 0.25 mg into the skin once a week.  Dispense: 2 mL; Refill: 6 - insulin glargine (LANTUS SOLOSTAR) 100 UNIT/ML  Solostar Pen; Inject 36 Units into the skin 2 (two) times daily.  Dispense: 30 mL; Refill: 6 - empagliflozin (JARDIANCE) 10 MG TABS tablet; Take 1 tablet (10 mg total) by mouth daily.  Dispense: 90 tablet; Refill: 1 - CMP14+EGFR  2. Loose, teeth - Ambulatory referral to Dentistry  3. Other diabetic neurological complication associated with type 2 diabetes mellitus (HCC) Uncontrolled Increase dose of gabapentin - gabapentin (NEURONTIN) 300 MG capsule; Take 2 capsules (600 mg total) by mouth 2 (two) times daily.  Dispense: 120 capsule; Refill: 6  4. Hyperlipidemia associated with type 2 diabetes mellitus (HCC) Controlled Low-cholesterol diet - rosuvastatin (CRESTOR) 40 MG tablet; TAKE 1 TABLET (40 MG TOTAL) BY MOUTH DAILY.  Dispense: 90 tablet; Refill: 1    Meds ordered this encounter  Medications   gabapentin (NEURONTIN) 300 MG capsule    Sig: Take 2 capsules (600 mg total) by mouth 2 (two) times daily.    Dispense:  120 capsule    Refill:  6   Semaglutide,0.25 or 0.5MG /DOS, (OZEMPIC, 0.25 OR 0.5 MG/DOSE,) 2 MG/1.5ML SOPN    Sig: Inject 0.25 mg into the skin once  a week.    Dispense:  2 mL    Refill:  6   insulin glargine (LANTUS SOLOSTAR) 100 UNIT/ML Solostar Pen    Sig: Inject 36 Units into the skin 2 (two) times daily.    Dispense:  30 mL    Refill:  6    Dose decrease   empagliflozin (JARDIANCE) 10 MG TABS tablet    Sig: Take 1 tablet (10 mg total) by mouth daily.    Dispense:  90 tablet    Refill:  1   rosuvastatin (CRESTOR) 40 MG tablet    Sig: TAKE 1 TABLET (40 MG TOTAL) BY MOUTH DAILY.    Dispense:  90 tablet    Refill:  1    Follow-up: Return in about 6 months (around 03/01/2022) for Chronic medical conditions.       Hoy Register, MD, FAAFP. [facility] [place], [place] [phone]   08/29/2021, 9:45 AM

## 2021-08-29 NOTE — Progress Notes (Signed)
Referral to dentist ?Medication refill ?

## 2021-08-30 LAB — CMP14+EGFR
ALT: 34 IU/L (ref 0–44)
AST: 23 IU/L (ref 0–40)
Albumin/Globulin Ratio: 1.5 (ref 1.2–2.2)
Albumin: 4.3 g/dL (ref 3.8–4.8)
Alkaline Phosphatase: 115 IU/L (ref 44–121)
BUN/Creatinine Ratio: 10 (ref 10–24)
BUN: 9 mg/dL (ref 8–27)
Bilirubin Total: 0.5 mg/dL (ref 0.0–1.2)
CO2: 23 mmol/L (ref 20–29)
Calcium: 9.4 mg/dL (ref 8.6–10.2)
Chloride: 105 mmol/L (ref 96–106)
Creatinine, Ser: 0.89 mg/dL (ref 0.76–1.27)
Globulin, Total: 2.9 g/dL (ref 1.5–4.5)
Glucose: 121 mg/dL — ABNORMAL HIGH (ref 70–99)
Potassium: 4.4 mmol/L (ref 3.5–5.2)
Sodium: 143 mmol/L (ref 134–144)
Total Protein: 7.2 g/dL (ref 6.0–8.5)
eGFR: 95 mL/min/{1.73_m2} (ref >59)

## 2021-09-13 ENCOUNTER — Telehealth: Payer: Self-pay

## 2021-09-13 NOTE — Telephone Encounter (Signed)
Contacted pt to schedule Medicare Wellness pt didn't answer lvm   °

## 2021-09-24 ENCOUNTER — Other Ambulatory Visit (HOSPITAL_COMMUNITY): Payer: Self-pay

## 2021-09-26 ENCOUNTER — Other Ambulatory Visit: Payer: Self-pay

## 2021-09-26 MED ORDER — OZEMPIC (0.25 OR 0.5 MG/DOSE) 2 MG/3ML ~~LOC~~ SOPN
PEN_INJECTOR | SUBCUTANEOUS | 0 refills | Status: DC
  Filled 2021-09-26: qty 3, 56d supply, fill #0
  Filled 2021-11-18: qty 3, 56d supply, fill #1
  Filled 2021-12-19: qty 3, 56d supply, fill #2

## 2021-09-30 ENCOUNTER — Other Ambulatory Visit: Payer: Self-pay

## 2021-11-02 ENCOUNTER — Other Ambulatory Visit: Payer: Self-pay

## 2021-11-07 ENCOUNTER — Other Ambulatory Visit: Payer: Self-pay

## 2021-11-08 IMAGING — CT CT MAXILLOFACIAL W/O CM
4 of 7 series · 17 of 47 positions shown, 19 images · non-contrast
Comparison: None.

CLINICAL DATA: Cranial neuropathy (CN I)

EXAM:
CT MAXILLOFACIAL WITHOUT CONTRAST
TECHNIQUE: Multidetector CT imaging of the maxillofacial structures was
performed. Multiplanar CT image reconstructions were also generated.

[Series 3: maxilllofacial 2.0 hr40 3 (person_name) · axial · 0.40mm/px · z∈[-182,-34]mm · 8 of 96 slices shown, 10 images]
[im 11/96  brain]
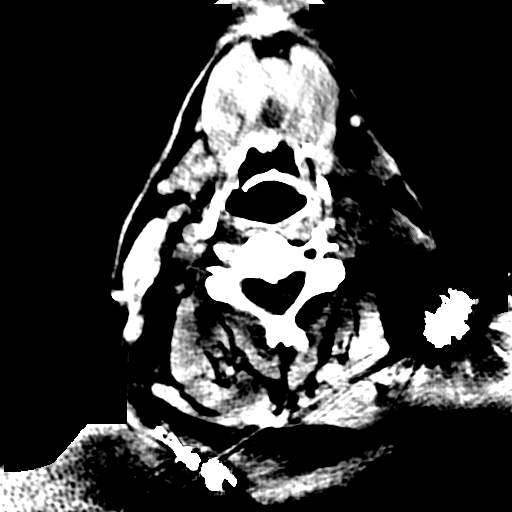
[im 11/96  bone]
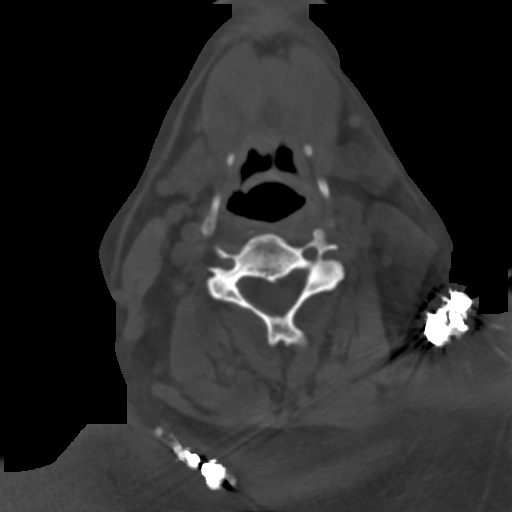
[im 22/96  bone]
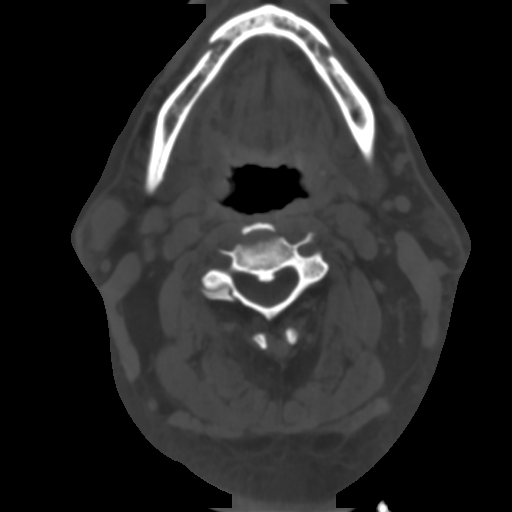
[im 32/96  bone]
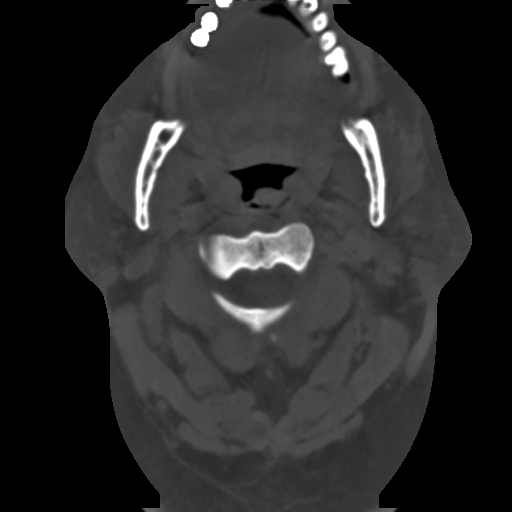
[im 43/96  bone]
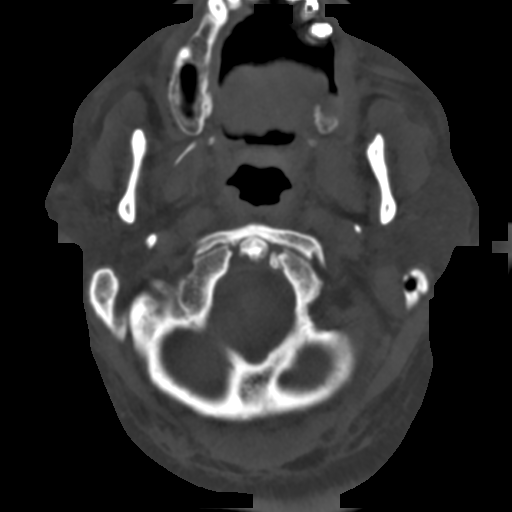
[im 53/96  brain]
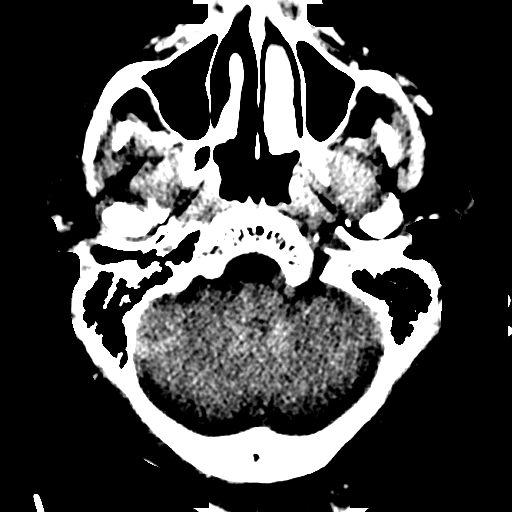
[im 53/96  bone]
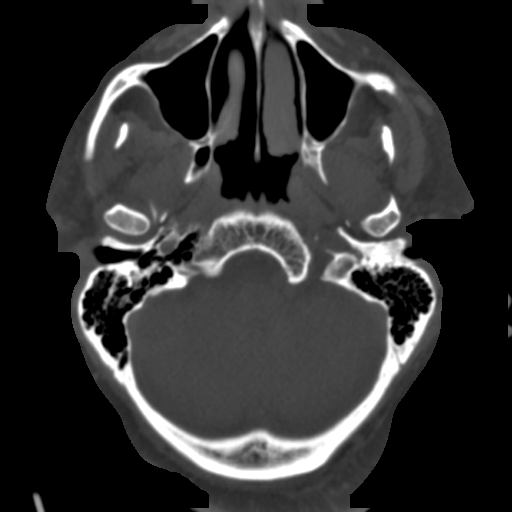
[im 64/96  bone]
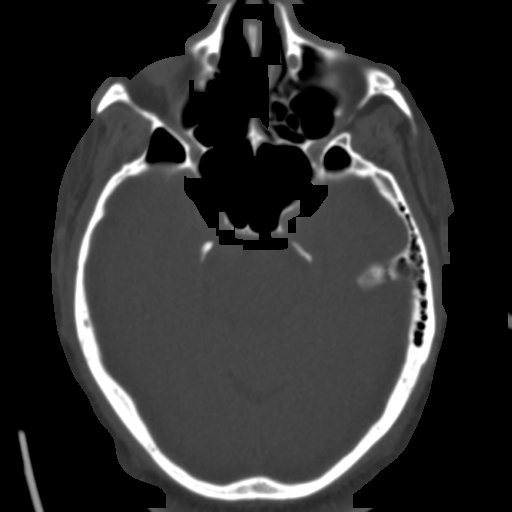
[im 74/96  bone]
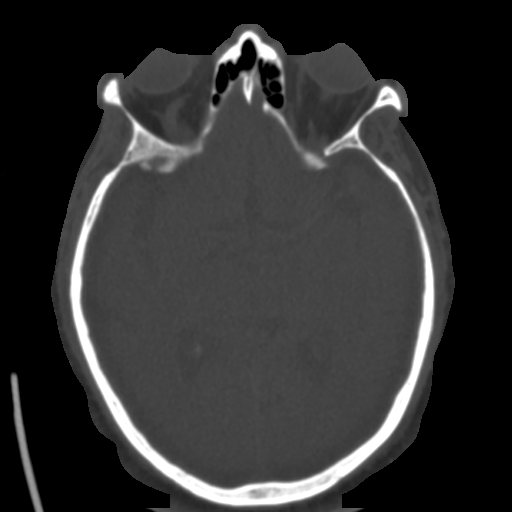
[im 85/96  bone]
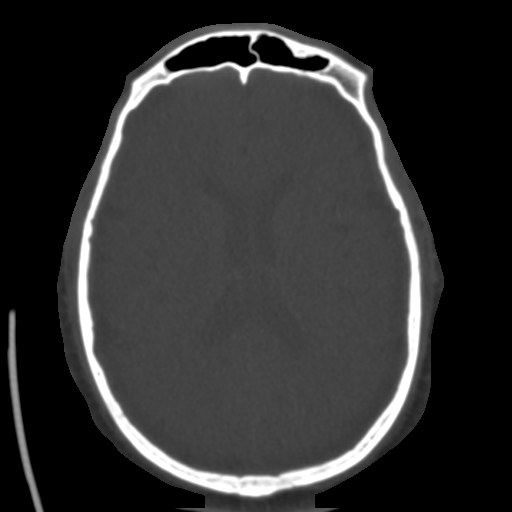

[Series 5: maxilllofacial 2.0 hr59 3 (person_name) · axial · 0.40mm/px · z∈[-182,-118]mm · 4 of 96 slices shown]
[im 11/96  bone]
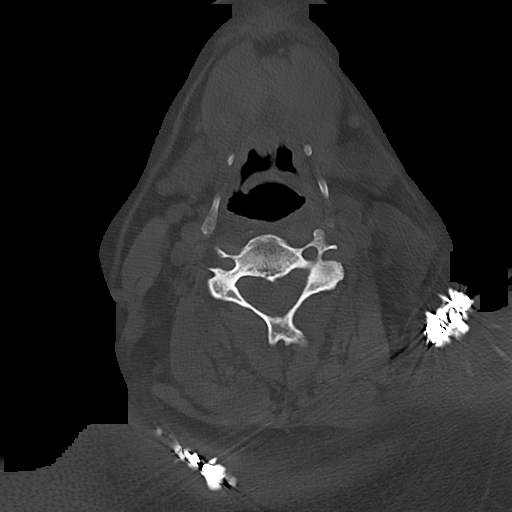
[im 22/96  bone]
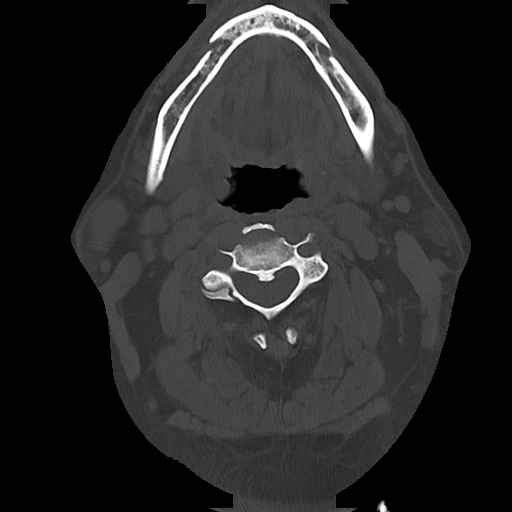
[im 32/96  bone]
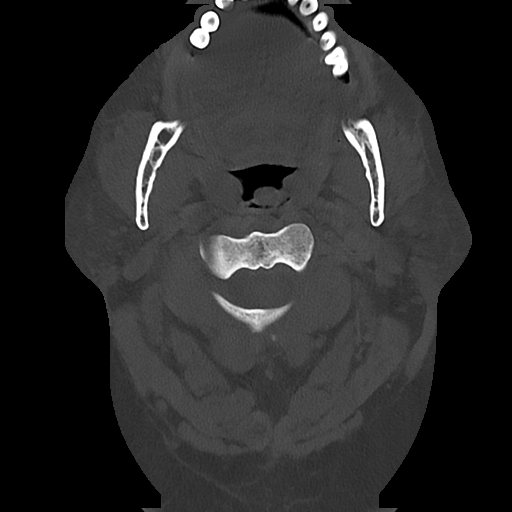
[im 43/96  bone]
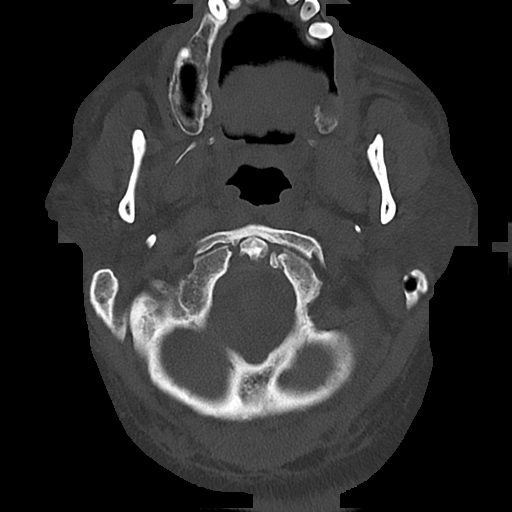

[Series 7: (person_name) · coronal · 0.36mm/px · 3 of 100 slices shown]
[im 25/100  bone]
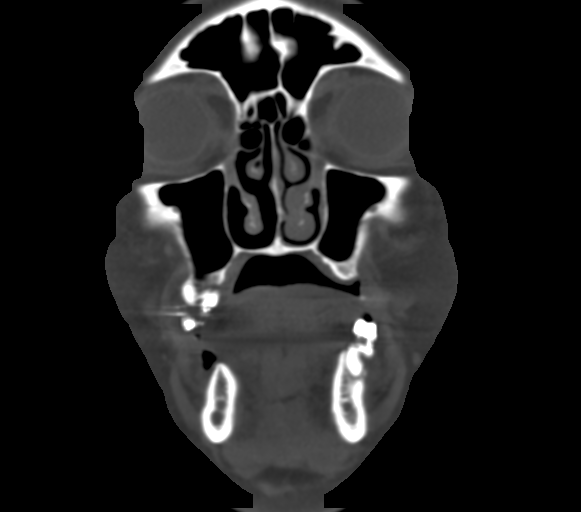
[im 50/100  bone]
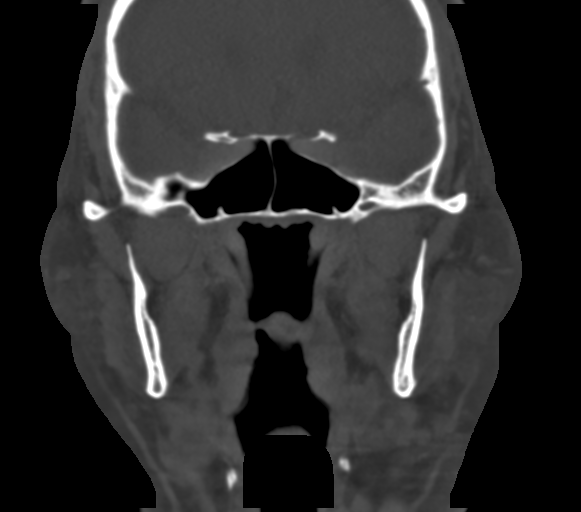
[im 75/100  bone]
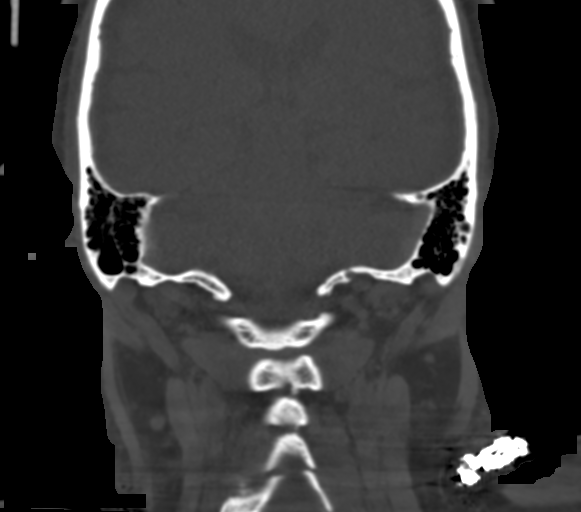

[Series 10: bone sag (person_name) · sagittal · 0.36mm/px · 2 of 106 slices shown]
[im 36/106  bone]
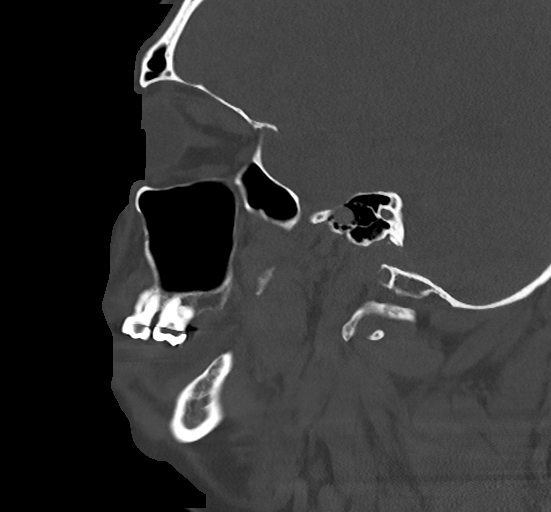
[im 71/106  bone]
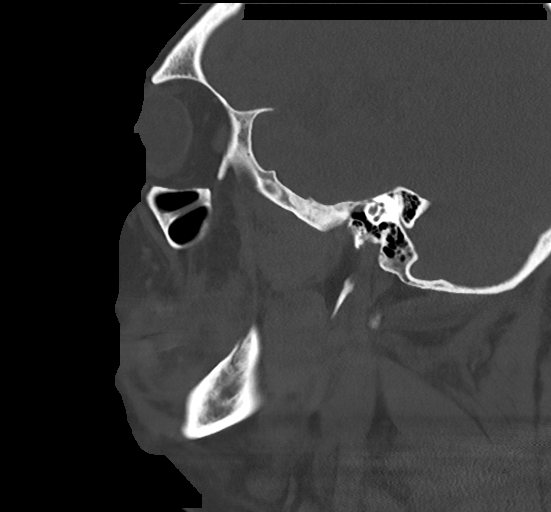

[17 of 47 positions shown; findings below may reference images not displayed]

FINDINGS: Osseous: The osseous structures are intact. Degenerative changes are
seen at the occipitoatlantal and atlantodental articulation. There
are numerous absent teeth and dental caries identified.
Additionally, periapical abscesses are seen involving the residual
left mandibular molar, mandibular incisors, and maxillary incisors.

Orbits: The orbits are unremarkable.

Sinuses: The paranasal sinuses are clear.

Soft tissues: The facial soft tissues are unremarkable.

Limited intracranial: The visualized intracranial contents are
unremarkable. The olfactory bulbs are not optimally assessed
utilizing this technique, but are present and are grossly
unremarkable.
IMPRESSION: Poor dentition with numerous absent teeth and dental caries.
Additionally, periapical abscesses are seen involving the residual
left mandibular molar, mandibular incisors, and maxillary incisors.

## 2021-11-18 ENCOUNTER — Other Ambulatory Visit: Payer: Self-pay

## 2021-11-18 ENCOUNTER — Other Ambulatory Visit: Payer: Self-pay | Admitting: Family Medicine

## 2021-11-18 DIAGNOSIS — Z794 Long term (current) use of insulin: Secondary | ICD-10-CM

## 2021-11-18 MED ORDER — ACCU-CHEK GUIDE W/DEVICE KIT
PACK | 0 refills | Status: DC
  Filled 2021-11-18: qty 1, 1d supply, fill #0

## 2021-11-18 MED ORDER — TRUEPLUS 5-BEVEL PEN NEEDLES 31G X 8 MM MISC
3 refills | Status: DC
  Filled 2021-11-18: qty 100, 25d supply, fill #0

## 2021-11-18 MED FILL — Glucose Blood Test Strip: 30 days supply | Qty: 100 | Fill #1 | Status: AC

## 2021-11-18 NOTE — Telephone Encounter (Signed)
Requested Prescriptions  Pending Prescriptions Disp Refills  . Blood Glucose Monitoring Suppl (ACCU-CHEK GUIDE) w/Device KIT 1 kit 0    Sig: Use to check blood sugar TID.     Endocrinology: Diabetes - Testing Supplies Passed - 11/18/2021  9:22 AM      Passed - Valid encounter within last 12 months    Recent Outpatient Visits          2 months ago Type 2 diabetes mellitus with other specified complication, with long-term current use of insulin ([place])   [place], [place], MD   8 months ago Type 2 diabetes mellitus with other specified complication, with long-term current use of insulin ([place])   [place], [place], MD   1 year ago Type 2 diabetes mellitus with other specified complication, with long-term current use of insulin ([place])   [place], [place], MD   1 year ago Type 2 diabetes mellitus with other specified complication, with long-term current use of insulin ([place])   [place], Charlane Ferretti, MD   1 year ago Type 2 diabetes mellitus with hyperglycemia, unspecified whether long term insulin use ([place])   [place], [place], MD      Future Appointments            In 3 months Charlott Rakes, MD [place]           . Insulin Pen Needle (TRUEPLUS 5-BEVEL PEN NEEDLES) 31G X 8 MM MISC 100 each 3    Sig: See admin instructions. See admin instructions.     Endocrinology: Diabetes - Testing Supplies Passed - 11/18/2021  9:22 AM      Passed - Valid encounter within last 12 months    Recent Outpatient Visits          2 months ago Type 2 diabetes mellitus with other specified complication, with long-term current use of insulin ([place])   [place], [place], MD   8 months ago Type 2 diabetes mellitus with other specified complication,  with long-term current use of insulin ([place])   [place], [place], MD   1 year ago Type 2 diabetes mellitus with other specified complication, with long-term current use of insulin ([place])   [place], [place], MD   1 year ago Type 2 diabetes mellitus with other specified complication, with long-term current use of insulin ([place])   [place], Charlane Ferretti, MD   1 year ago Type 2 diabetes mellitus with hyperglycemia, unspecified whether long term insulin use ([place])   [place], MD      Future Appointments            In 3 months Charlott Rakes, MD [place]

## 2021-11-21 ENCOUNTER — Other Ambulatory Visit: Payer: Self-pay

## 2021-12-05 ENCOUNTER — Other Ambulatory Visit: Payer: Self-pay

## 2021-12-05 ENCOUNTER — Ambulatory Visit: Payer: Self-pay

## 2021-12-05 NOTE — Patient Outreach (Signed)
  Care Coordination   12/05/2021 Name: Bill Torres MRN: [id number] DOB: January 22, 1956   Care Coordination Outreach Attempts:  An unsuccessful telephone outreach was attempted today to offer the patient information about available care coordination services as a benefit of their health plan.   Follow Up Plan:  Additional outreach attempts will be made to offer the patient care coordination information and services.   Encounter Outcome:  No Answer  Care Coordination Interventions Activated:  No   Care Coordination Interventions:  No, not indicated    Daneen Schick, BSW, CDP Social Worker, Certified Dementia Practitioner Care Coordination [phone]

## 2021-12-12 ENCOUNTER — Ambulatory Visit: Payer: Self-pay

## 2021-12-12 NOTE — Patient Outreach (Signed)
  Care Coordination   12/12/2021 Name: Bill Torres MRN: [id number] DOB: 11-21-1955   Care Coordination Outreach Attempts:  A second unsuccessful outreach was attempted today to offer the patient with information about available care coordination services as a benefit of their health plan.     Follow Up Plan:  Additional outreach attempts will be made to offer the patient care coordination information and services.   Encounter Outcome:  No Answer  Care Coordination Interventions Activated:  No   Care Coordination Interventions:  No, not indicated    Daneen Schick, BSW, CDP Social Worker, Certified Dementia Practitioner Care Coordination [phone]

## 2021-12-19 ENCOUNTER — Other Ambulatory Visit: Payer: Self-pay | Admitting: Pharmacist

## 2021-12-19 ENCOUNTER — Ambulatory Visit: Payer: Self-pay

## 2021-12-19 ENCOUNTER — Other Ambulatory Visit: Payer: Self-pay

## 2021-12-19 MED ORDER — TRUEPLUS 5-BEVEL PEN NEEDLES 32G X 4 MM MISC
2 refills | Status: DC
  Filled 2021-12-19: qty 100, 30d supply, fill #0
  Filled 2021-12-23: qty 100, 47d supply, fill #0
  Filled 2022-02-12: qty 100, 47d supply, fill #1
  Filled 2022-05-11: qty 100, 50d supply, fill #2

## 2021-12-19 MED ORDER — OZEMPIC (0.25 OR 0.5 MG/DOSE) 2 MG/3ML ~~LOC~~ SOPN
0.5000 mg | PEN_INJECTOR | SUBCUTANEOUS | 2 refills | Status: DC
  Filled 2021-12-19: qty 3, fill #0
  Filled 2021-12-23 – 2021-12-27 (×2): qty 3, 28d supply, fill #0
  Filled 2022-01-27: qty 3, 28d supply, fill #1
  Filled 2022-02-24: qty 3, 28d supply, fill #2

## 2021-12-19 NOTE — Patient Outreach (Signed)
  Care Coordination   12/19/2021 Name: Bill Torres MRN: [id number] DOB: 09/26/1955   Care Coordination Outreach Attempts:  A third unsuccessful outreach was attempted today to offer the patient with information about available care coordination services as a benefit of their health plan.   Follow Up Plan:  No further outreach attempts will be made at this time. We have been unable to contact the patient to offer or enroll patient in care coordination services  Encounter Outcome:  No Answer  Care Coordination Interventions Activated:  No   Care Coordination Interventions:  No, not indicated    Daneen Schick, BSW, CDP Social Worker, Certified Dementia Practitioner Care Coordination [phone]

## 2021-12-23 ENCOUNTER — Other Ambulatory Visit: Payer: Self-pay

## 2021-12-27 ENCOUNTER — Other Ambulatory Visit: Payer: Self-pay

## 2022-01-02 ENCOUNTER — Other Ambulatory Visit: Payer: Self-pay

## 2022-01-04 ENCOUNTER — Other Ambulatory Visit: Payer: Self-pay

## 2022-01-27 ENCOUNTER — Other Ambulatory Visit: Payer: Self-pay

## 2022-02-13 ENCOUNTER — Other Ambulatory Visit: Payer: Self-pay | Admitting: Family Medicine

## 2022-02-13 ENCOUNTER — Other Ambulatory Visit: Payer: Self-pay

## 2022-02-13 ENCOUNTER — Other Ambulatory Visit (HOSPITAL_COMMUNITY): Payer: Self-pay

## 2022-02-13 DIAGNOSIS — E1169 Type 2 diabetes mellitus with other specified complication: Secondary | ICD-10-CM

## 2022-02-13 MED ORDER — EMPAGLIFLOZIN 10 MG PO TABS
10.0000 mg | ORAL_TABLET | Freq: Every day | ORAL | 0 refills | Status: DC
  Filled 2022-02-13: qty 30, 30d supply, fill #0

## 2022-02-13 MED ORDER — ROSUVASTATIN CALCIUM 40 MG PO TABS
ORAL_TABLET | Freq: Every day | ORAL | 0 refills | Status: DC
  Filled 2022-02-13: qty 30, fill #0

## 2022-02-24 ENCOUNTER — Other Ambulatory Visit: Payer: Self-pay

## 2022-03-01 ENCOUNTER — Other Ambulatory Visit: Payer: Self-pay

## 2022-03-06 ENCOUNTER — Encounter: Payer: Self-pay | Admitting: Family Medicine

## 2022-03-06 ENCOUNTER — Other Ambulatory Visit: Payer: Self-pay

## 2022-03-06 ENCOUNTER — Ambulatory Visit: Payer: Medicare Other | Attending: Family Medicine | Admitting: Family Medicine

## 2022-03-06 VITALS — BP 127/81 | HR 95 | Ht 73.0 in | Wt 294.8 lb

## 2022-03-06 DIAGNOSIS — E1149 Type 2 diabetes mellitus with other diabetic neurological complication: Secondary | ICD-10-CM | POA: Diagnosis not present

## 2022-03-06 DIAGNOSIS — E1169 Type 2 diabetes mellitus with other specified complication: Secondary | ICD-10-CM

## 2022-03-06 DIAGNOSIS — Z794 Long term (current) use of insulin: Secondary | ICD-10-CM | POA: Diagnosis not present

## 2022-03-06 DIAGNOSIS — E785 Hyperlipidemia, unspecified: Secondary | ICD-10-CM

## 2022-03-06 LAB — POCT GLYCOSYLATED HEMOGLOBIN (HGB A1C): HbA1c, POC (controlled diabetic range): 6.4 % (ref 0.0–7.0)

## 2022-03-06 LAB — GLUCOSE, POCT (MANUAL RESULT ENTRY): POC Glucose: 181 mg/dl — AB (ref 70–99)

## 2022-03-06 MED ORDER — LANTUS SOLOSTAR 100 UNIT/ML ~~LOC~~ SOPN
45.0000 [IU] | PEN_INJECTOR | Freq: Every day | SUBCUTANEOUS | 6 refills | Status: DC
  Filled 2022-03-06: qty 30, 55d supply, fill #0
  Filled 2022-05-27 – 2022-05-30 (×2): qty 30, 55d supply, fill #1
  Filled 2022-08-18: qty 30, 55d supply, fill #2
  Filled 2022-08-21: qty 30, 55d supply, fill #0
  Filled 2022-08-21: qty 30, 55d supply, fill #2

## 2022-03-06 MED ORDER — SEMAGLUTIDE (1 MG/DOSE) 4 MG/3ML ~~LOC~~ SOPN
1.0000 mg | PEN_INJECTOR | SUBCUTANEOUS | 6 refills | Status: DC
  Filled 2022-03-06: qty 3, 28d supply, fill #0
  Filled 2022-05-11: qty 3, 28d supply, fill #1
  Filled 2022-06-13 – 2022-06-16 (×2): qty 3, 28d supply, fill #2
  Filled 2022-07-30: qty 3, 28d supply, fill #3

## 2022-03-06 MED ORDER — ROSUVASTATIN CALCIUM 40 MG PO TABS
40.0000 mg | ORAL_TABLET | Freq: Every day | ORAL | 1 refills | Status: DC
  Filled 2022-03-06: qty 90, 90d supply, fill #0
  Filled 2022-05-30: qty 90, 90d supply, fill #1

## 2022-03-06 MED ORDER — EMPAGLIFLOZIN 10 MG PO TABS
10.0000 mg | ORAL_TABLET | Freq: Every day | ORAL | 1 refills | Status: DC
  Filled 2022-03-06: qty 90, 90d supply, fill #0
  Filled 2022-05-30: qty 90, 90d supply, fill #1

## 2022-03-06 MED ORDER — GABAPENTIN 300 MG PO CAPS
600.0000 mg | ORAL_CAPSULE | Freq: Two times a day (BID) | ORAL | 1 refills | Status: DC
  Filled 2022-03-06 – 2022-03-24 (×4): qty 360, 90d supply, fill #0

## 2022-03-06 NOTE — Progress Notes (Signed)
Patient wants to know if he can reduce his insulin once a day instead of twice

## 2022-03-06 NOTE — Patient Instructions (Signed)
Increase Trulicity from 0.5 mg once a week to 1 mg once a week. Change Lantus dosing from 36 units twice daily to 45 units twice daily.  Increase your Lantus dose by 2 units every fourth day until blood sugars are at goal.  If blood sugars are less than 80, decrease Lantus dose by 2 units.

## 2022-03-06 NOTE — Progress Notes (Signed)
Subjective:  Patient ID: Bill Torres, male    DOB: [birth year]  Age: 66 y.o. MRN: [id number]  CC: Diabetes and Medication Refill   HPI [name] is a 66 y.o. year old male with a history of obesity, type 2 diabetes mellitus (A1c 6.4 diagnosed in 11/2019), hyperlipidemia here for chronic disease management   Interval History:  A1c is 6.4 down from 6.9 previously and he is doing well on Trulicity and Lantus but would like to decrease Lantus dosing from twice daily to once daily.  He has also lost 16 pounds in the last 6 months. He has had no hypoglycemic episodes and neuropathy is controlled on gabapentin.  He has no visual concerns. Last eye exam was about 2 months ago. Endorses adherence with his statin. He has no additional concerns today.  Past Medical History:  Diagnosis Date   Cataract    Dental abscess 12/15/2019   Hyperlipidemia associated with type 2 diabetes mellitus ([place]) 12/14/2019    Past Surgical History:  Procedure Laterality Date   WISDOM TOOTH EXTRACTION      Family History  Problem Relation Age of Onset   Colon cancer Neg Hx    Esophageal cancer Neg Hx    Rectal cancer Neg Hx    Stomach cancer Neg Hx    Colon polyps Neg Hx     Social History   Socioeconomic History   Marital status: Single    Spouse name: Not on file   Number of children: Not on file   Years of education: Not on file   Highest education level: Not on file  Occupational History   Not on file  Tobacco Use   Smoking status: Never   Smokeless tobacco: Never  Substance and Sexual Activity   Alcohol use: Never   Drug use: Never   Sexual activity: Not on file  Other Topics Concern   Not on file  Social History Narrative   Not on file   Social Determinants of Health   Financial Resource Strain: Not on file  Food Insecurity: Not on file  Transportation Needs: Not on file  Physical Activity: Not on file  Stress: Not on file  Social Connections: Not on file     No Known Allergies  Outpatient Medications Prior to Visit  Medication Sig Dispense Refill   Accu-Chek Softclix Lancets lancets Use to check blood sugar three times a day 100 each 2   Blood Glucose Monitoring Suppl (ACCU-CHEK GUIDE) w/Device KIT Use to check blood sugar three times a day 1 kit 0   glucose blood (ACCU-CHEK GUIDE) test strip USE TO TEST BLOOD SUGAR THREE TIMES A DAY 100 strip 2   Insulin Pen Needle (TRUEPLUS 5-BEVEL PEN NEEDLES) 32G X 4 MM MISC Use to inject Lantus and Ozempic. 100 each 2   ondansetron (ZOFRAN) 4 MG tablet Take 1 tablet (4 mg total) by mouth every 8 (eight) hours as needed. 12 tablet 0   oxyCODONE-acetaminophen (PERCOCET/ROXICET) 5-325 MG tablet Take 1 tablet by mouth every 4 (four) hours as needed for severe pain. 15 tablet 0   empagliflozin (JARDIANCE) 10 MG TABS tablet Take 1 tablet (10 mg total) by mouth daily. 30 tablet 0   gabapentin (NEURONTIN) 300 MG capsule Take 2 capsules (600 mg total) by mouth 2 (two) times daily. 120 capsule 6   insulin glargine (LANTUS SOLOSTAR) 100 UNIT/ML Solostar Pen Inject 36 Units into the skin 2 (two) times daily. 30 mL 6   rosuvastatin (CRESTOR)  40 MG tablet TAKE 1 TABLET (40 MG TOTAL) BY MOUTH DAILY. 30 tablet 0   Semaglutide,0.25 or 0.5MG/DOS, (OZEMPIC, 0.25 OR 0.5 MG/DOSE,) 2 MG/3ML SOPN Inject 0.5 mg into the skin once a week. 3 mL 2   No facility-administered medications prior to visit.     ROS Review of Systems  Constitutional:  Negative for activity change and appetite change.  HENT:  Negative for sinus pressure and sore throat.   Respiratory:  Negative for chest tightness, shortness of breath and wheezing.   Cardiovascular:  Negative for chest pain and palpitations.  Gastrointestinal:  Negative for abdominal distention, abdominal pain and constipation.  Genitourinary: Negative.   Musculoskeletal: Negative.   Psychiatric/Behavioral:  Negative for behavioral problems and dysphoric mood.     Objective:   BP 127/81 (BP Location: Left Arm, Patient Position: Sitting, Cuff Size: Normal)   Pulse 95   Ht _0  (1.854 m)   Wt 294 lb 12.8 oz (133.7 kg)   SpO2 97%   BMI 38.89 kg/m      03/06/2022    8:44 AM 08/29/2021    9:24 AM 03/01/2021    1:38 PM  BP/Weight  Systolic BP 401 027 253  Diastolic BP 81 76 86  Wt. (Lbs) 294.8 310 305  BMI 38.89 kg/m2 40.9 kg/m2 40.24 kg/m2      Physical Exam Constitutional:      Appearance: He is well-developed.  Cardiovascular:     Rate and Rhythm: Normal rate.     Heart sounds: Normal heart sounds. No murmur heard. Pulmonary:     Effort: Pulmonary effort is normal.     Breath sounds: Normal breath sounds. No wheezing or rales.  Chest:     Chest wall: No tenderness.  Abdominal:     General: Bowel sounds are normal. There is no distension.     Palpations: Abdomen is soft. There is no mass.     Tenderness: There is no abdominal tenderness.  Musculoskeletal:        General: Normal range of motion.     Right lower leg: No edema.     Left lower leg: No edema.  Neurological:     Mental Status: He is alert and oriented to person, place, and time.  Psychiatric:        Mood and Affect: Mood normal.        Latest Ref Rng & Units 08/29/2021   10:13 AM 03/08/2021    8:49 AM 09/13/2020    7:54 AM  CMP  Glucose 70 - 99 mg/dL 121  159  143   BUN 8 - 27 mg/dL _1 Creatinine 0.76 - 1.27 mg/dL 0.89  0.82  1.01   Sodium 134 - 144 mmol/L 143  142  139   Potassium 3.5 - 5.2 mmol/L 4.4  4.7  3.4   Chloride 96 - 106 mmol/L 105  102  107   CO2 20 - 29 mmol/L _2 Calcium 8.6 - 10.2 mg/dL 9.4  9.3  8.8   Total Protein 6.0 - 8.5 g/dL 7.2  7.5  7.5   Total Bilirubin 0.0 - 1.2 mg/dL 0.5  0.9  1.1   Alkaline Phos 44 - 121 IU/L 115  133  88   AST 0 - 40 IU/L _3 ALT 0 - 44 IU/L 34  33  33     Lipid Panel  Component Value Date/Time   CHOL 135 03/08/2021 0849   TRIG 186 (H) 03/08/2021 0849   HDL 30 (L) 03/08/2021 0849   CHOLHDL  7.8 (H) 06/16/2020 1130   CHOLHDL 9.2 12/13/2019 1647   VLDL 60 (H) 12/13/2019 1647   LDLCALC 73 03/08/2021 0849    CBC    Component Value Date/Time   WBC 10.6 (H) 09/13/2020 0754   RBC 5.11 09/13/2020 0754   HGB 15.6 09/13/2020 0754   HGB 14.4 01/07/2020 1111   HCT 45.9 09/13/2020 0754   HCT 44.1 01/07/2020 1111   PLT 232 09/13/2020 0754   PLT 235 01/07/2020 1111   MCV 89.8 09/13/2020 0754   MCV 90 01/07/2020 1111   MCH 30.5 09/13/2020 0754   MCHC 34.0 09/13/2020 0754   RDW 13.3 09/13/2020 0754   RDW 12.8 01/07/2020 1111   LYMPHSABS 2.6 01/07/2020 1111   MONOABS 1.0 12/13/2019 1647   EOSABS 0.3 01/07/2020 1111   BASOSABS 0.0 01/07/2020 1111    Lab Results  Component Value Date   HGBA1C 6.4 03/06/2022    Assessment & Plan:   1. Type 2 diabetes mellitus with other specified complication, with long-term current use of insulin (HCC) Controlled with A1c of 6.4 down from 6.9 previously Due to desire for a one-time dosing of insulin I have decreased Lantus from 36 units twice daily to 45 units daily and advised to uptitrate or down titrate by 2 units every fourth day until blood sugars are at goal or if hypoglycemia occurs Increased dose of Ozempic At next visit we will consider titrating up to the maximum dose of Ozempic at 2 mg and decreasing Lantus dose if indicated Counseled on Diabetic diet, my plate method, 665 minutes of moderate intensity exercise/week Blood sugar logs with fasting goals of 80-120 mg/dl, random of less than 180 and in the event of sugars less than 60 mg/dl or greater than 400 mg/dl encouraged to notify the clinic. Advised on the need for annual eye exams, annual foot exams, Pneumonia vaccine. - POCT glucose (manual entry) - POCT glycosylated hemoglobin (Hb A1C) - insulin glargine (LANTUS SOLOSTAR) 100 UNIT/ML Solostar Pen; Inject 45 Units into the skin daily. Increase by 2 units every 4th day until blood sugars are at goal, max daily dose 55 units   Dispense: 30 mL; Refill: 6 - Semaglutide, 1 MG/DOSE, 4 MG/3ML SOPN; Inject 1 mg as directed once a week.  Dispense: 3 mL; Refill: 6 - Microalbumin / creatinine urine ratio; Future - LP+Non-HDL Cholesterol; Future - CMP14+EGFR; Future - empagliflozin (JARDIANCE) 10 MG TABS tablet; Take 1 tablet (10 mg total) by mouth daily.  Dispense: 90 tablet; Refill: 1  2. Other diabetic neurological complication associated with type 2 diabetes mellitus (HCC) Controlled - gabapentin (NEURONTIN) 300 MG capsule; Take 2 capsules (600 mg total) by mouth 2 (two) times daily.  Dispense: 360 capsule; Refill: 1  3. Hyperlipidemia associated with type 2 diabetes mellitus ([place]) Last set of labs reviewed LDL at goal but slightly elevated hypertriglyceridemia We will check lipid panel Low-cholesterol diet - rosuvastatin (CRESTOR) 40 MG tablet; Take 1 tablet (40 mg total) by mouth daily.  Dispense: 90 tablet; Refill: 1   Health Care Maintenance: Declines flu shot, Shingrix and Tdap Meds ordered this encounter  Medications   insulin glargine (LANTUS SOLOSTAR) 100 UNIT/ML Solostar Pen    Sig: Inject 45 Units into the skin daily. Increase by 2 units every 4th day until blood sugars are at goal, max daily dose 55  units    Dispense:  30 mL    Refill:  6    Dose decrease   Semaglutide, 1 MG/DOSE, 4 MG/3ML SOPN    Sig: Inject 1 mg as directed once a week.    Dispense:  3 mL    Refill:  6    Discontinue 0.43m   empagliflozin (JARDIANCE) 10 MG TABS tablet    Sig: Take 1 tablet (10 mg total) by mouth daily.    Dispense:  90 tablet    Refill:  1   gabapentin (NEURONTIN) 300 MG capsule    Sig: Take 2 capsules (600 mg total) by mouth 2 (two) times daily.    Dispense:  360 capsule    Refill:  1   rosuvastatin (CRESTOR) 40 MG tablet    Sig: Take 1 tablet (40 mg total) by mouth daily.    Dispense:  90 tablet    Refill:  1    Follow-up: Return in about 6 months (around 09/04/2022) for Chronic medical conditions.        ECharlott Rakes MD, FAAFP. CSky [facility] Centerand W[place]  03/06/2022, 10:28 AM

## 2022-03-08 ENCOUNTER — Other Ambulatory Visit: Payer: Self-pay

## 2022-03-08 ENCOUNTER — Ambulatory Visit: Payer: Medicare Other | Attending: Family Medicine

## 2022-03-08 DIAGNOSIS — E1169 Type 2 diabetes mellitus with other specified complication: Secondary | ICD-10-CM | POA: Diagnosis not present

## 2022-03-08 DIAGNOSIS — Z794 Long term (current) use of insulin: Secondary | ICD-10-CM | POA: Diagnosis not present

## 2022-03-09 LAB — LP+NON-HDL CHOLESTEROL
Cholesterol, Total: 126 mg/dL (ref 100–199)
HDL: 31 mg/dL — ABNORMAL LOW (ref >39)
LDL Chol Calc (NIH): 69 mg/dL (ref 0–99)
Total Non-HDL-Chol (LDL+VLDL): 95 mg/dL (ref 0–129)
Triglycerides: 150 mg/dL — ABNORMAL HIGH (ref 0–149)
VLDL Cholesterol Cal: 26 mg/dL (ref 5–40)

## 2022-03-09 LAB — CMP14+EGFR
ALT: 31 IU/L (ref 0–44)
AST: 20 IU/L (ref 0–40)
Albumin/Globulin Ratio: 1.3 (ref 1.2–2.2)
Albumin: 4.4 g/dL (ref 3.9–4.9)
Alkaline Phosphatase: 107 IU/L (ref 44–121)
BUN/Creatinine Ratio: 8 — ABNORMAL LOW (ref 10–24)
BUN: 7 mg/dL — ABNORMAL LOW (ref 8–27)
Bilirubin Total: 0.5 mg/dL (ref 0.0–1.2)
CO2: 26 mmol/L (ref 20–29)
Calcium: 9.9 mg/dL (ref 8.6–10.2)
Chloride: 102 mmol/L (ref 96–106)
Creatinine, Ser: 0.92 mg/dL (ref 0.76–1.27)
Globulin, Total: 3.3 g/dL (ref 1.5–4.5)
Glucose: 90 mg/dL (ref 70–99)
Potassium: 4.5 mmol/L (ref 3.5–5.2)
Sodium: 141 mmol/L (ref 134–144)
Total Protein: 7.7 g/dL (ref 6.0–8.5)
eGFR: 92 mL/min/{1.73_m2} (ref >59)

## 2022-03-09 LAB — MICROALBUMIN / CREATININE URINE RATIO
Creatinine, Urine: 201.8 mg/dL
Microalb/Creat Ratio: 7 mg/g creat (ref 0–29)
Microalbumin, Urine: 14.1 ug/mL

## 2022-03-18 ENCOUNTER — Other Ambulatory Visit (HOSPITAL_COMMUNITY): Payer: Self-pay

## 2022-03-20 ENCOUNTER — Other Ambulatory Visit: Payer: Self-pay

## 2022-03-21 ENCOUNTER — Other Ambulatory Visit: Payer: Self-pay

## 2022-03-24 ENCOUNTER — Other Ambulatory Visit: Payer: Self-pay

## 2022-03-24 ENCOUNTER — Other Ambulatory Visit (HOSPITAL_COMMUNITY): Payer: Self-pay

## 2022-05-12 ENCOUNTER — Other Ambulatory Visit: Payer: Self-pay

## 2022-05-12 ENCOUNTER — Other Ambulatory Visit: Payer: Self-pay | Admitting: Family Medicine

## 2022-05-12 DIAGNOSIS — E1169 Type 2 diabetes mellitus with other specified complication: Secondary | ICD-10-CM

## 2022-05-12 MED ORDER — ACCU-CHEK SOFTCLIX LANCETS MISC
2 refills | Status: AC
  Filled 2022-05-12 – 2022-05-15 (×2): qty 100, 33d supply, fill #0

## 2022-05-12 NOTE — Telephone Encounter (Signed)
Requested medication (s) are due for refill today: expired medication  Requested medication (s) are on the active medication list: yes  Last refill:  10/22/20 #100 2 refills  Future visit scheduled: yes in 3 months   Notes to clinic:  expired medication do you want to renew Rx?     Requested Prescriptions  Pending Prescriptions Disp Refills   Accu-Chek Softclix Lancets lancets 100 each 2    Sig: Use to check blood sugar three times a day     Endocrinology: Diabetes - Testing Supplies Passed - 05/12/2022  3:54 AM      Passed - Valid encounter within last 12 months    Recent Outpatient Visits           2 months ago Type 2 diabetes mellitus with other specified complication, with long-term current use of insulin ([place])   [place], [place], MD   8 months ago Type 2 diabetes mellitus with other specified complication, with long-term current use of insulin ([place])   [place], [place], MD   1 year ago Type 2 diabetes mellitus with other specified complication, with long-term current use of insulin ([place])   [place], [place], MD   1 year ago Type 2 diabetes mellitus with other specified complication, with long-term current use of insulin ([place])   [place], Charlane Ferretti, MD   1 year ago Type 2 diabetes mellitus with other specified complication, with long-term current use of insulin [facility])   [place] Charlott Rakes, MD       Future Appointments             In 3 months Charlott Rakes, MD [place]

## 2022-05-15 ENCOUNTER — Other Ambulatory Visit: Payer: Self-pay

## 2022-05-17 ENCOUNTER — Other Ambulatory Visit: Payer: Self-pay | Admitting: Family Medicine

## 2022-05-17 ENCOUNTER — Other Ambulatory Visit: Payer: Self-pay

## 2022-05-17 ENCOUNTER — Other Ambulatory Visit (HOSPITAL_COMMUNITY): Payer: Self-pay

## 2022-05-17 DIAGNOSIS — Z794 Long term (current) use of insulin: Secondary | ICD-10-CM

## 2022-05-17 MED ORDER — ACCU-CHEK GUIDE VI STRP
ORAL_STRIP | 2 refills | Status: DC
  Filled 2022-05-17: qty 100, 33d supply, fill #0
  Filled 2022-09-06: qty 100, 33d supply, fill #1
  Filled 2023-02-12 (×2): qty 100, 33d supply, fill #2

## 2022-05-29 ENCOUNTER — Other Ambulatory Visit: Payer: Self-pay

## 2022-05-30 ENCOUNTER — Other Ambulatory Visit: Payer: Self-pay

## 2022-06-05 ENCOUNTER — Ambulatory Visit: Payer: 59 | Attending: Family Medicine

## 2022-06-05 DIAGNOSIS — Z0000 Encounter for general adult medical examination without abnormal findings: Secondary | ICD-10-CM

## 2022-06-05 NOTE — Patient Instructions (Addendum)
Bill Torres , Thank you for taking time to come for your Medicare Wellness Visit. I appreciate your ongoing commitment to your health goals. Please review the following plan we discussed and let me know if I can assist you in the future.   These are the goals we discussed:  Goals   None     This is a list of the screening recommended for you and due dates:  Health Maintenance  Topic Date Due   Medicare Annual Wellness Visit  Never done   COVID-19 Vaccine (3 - 2023-24 season) 12/23/2021   Zoster (Shingles) Vaccine (1 of 2) 06/06/2022*   Flu Shot  07/23/2022*   Pneumonia Vaccine (1 of 1 - PCV) 03/07/2023*   Hemoglobin A1C  09/04/2022   Eye exam for diabetics  01/03/2023   Complete foot exam   03/07/2023   Yearly kidney function blood test for diabetes  03/09/2023   Yearly kidney health urinalysis for diabetes  03/09/2023   Colon Cancer Screening  10/19/2027   Hepatitis C Screening: USPSTF Recommendation to screen - Ages 18-79 yo.  Completed   HPV Vaccine  Aged Out   DTaP/Tdap/Td vaccine  Discontinued  *Topic was postponed. The date shown is not the original due date.  Health Maintenance After Age 59 After age 56, you are at a higher risk for certain long-term diseases and infections as well as injuries from falls. Falls are a major cause of broken bones and head injuries in people who are older than age 81. Getting regular preventive care can help to keep you healthy and well. Preventive care includes getting regular testing and making lifestyle changes as recommended by your health care provider. Talk with your health care provider about: Which screenings and tests you should have. A screening is a test that checks for a disease when you have no symptoms. A diet and exercise plan that is right for you. What should I know about screenings and tests to prevent falls? Screening and testing are the best ways to find a health problem early. Early diagnosis and treatment give you the best  chance of managing medical conditions that are common after age 28. Certain conditions and lifestyle choices may make you more likely to have a fall. Your health care provider may recommend: Regular vision checks. Poor vision and conditions such as cataracts can make you more likely to have a fall. If you wear glasses, make sure to get your prescription updated if your vision changes. Medicine review. Work with your health care provider to regularly review all of the medicines you are taking, including over-the-counter medicines. Ask your health care provider about any side effects that may make you more likely to have a fall. Tell your health care provider if any medicines that you take make you feel dizzy or sleepy. Strength and balance checks. Your health care provider may recommend certain tests to check your strength and balance while standing, walking, or changing positions. Foot health exam. Foot pain and numbness, as well as not wearing proper footwear, can make you more likely to have a fall. Screenings, including: Osteoporosis screening. Osteoporosis is a condition that causes the bones to get weaker and break more easily. Blood pressure screening. Blood pressure changes and medicines to control blood pressure can make you feel dizzy. Depression screening. You may be more likely to have a fall if you have a fear of falling, feel depressed, or feel unable to do activities that you used to do. Alcohol use screening.  Using too much alcohol can affect your balance and may make you more likely to have a fall. Follow these instructions at home: Lifestyle Do not drink alcohol if: Your health care provider tells you not to drink. If you drink alcohol: Limit how much you have to: 0-1 drink a day for women. 0-2 drinks a day for men. Know how much alcohol is in your drink. In the [place] one drink equals one 12 oz bottle of beer (355 mL), one 5 oz glass of wine (148 mL), or one 1 oz glass of hard  liquor (44 mL). Do not use any products that contain nicotine or tobacco. These products include cigarettes, chewing tobacco, and vaping devices, such as e-cigarettes. If you need help quitting, ask your health care provider. Activity  Follow a regular exercise program to stay fit. This will help you maintain your balance. Ask your health care provider what types of exercise are appropriate for you. If you need a cane or walker, use it as recommended by your health care provider. Wear supportive shoes that have nonskid soles. Safety  Remove any tripping hazards, such as rugs, cords, and clutter. Install safety equipment such as grab bars in bathrooms and safety rails on stairs. Keep rooms and walkways well-lit. General instructions Talk with your health care provider about your risks for falling. Tell your health care provider if: You fall. Be sure to tell your health care provider about all falls, even ones that seem minor. You feel dizzy, tiredness (fatigue), or off-balance. Take over-the-counter and prescription medicines only as told by your health care provider. These include supplements. Eat a healthy diet and maintain a healthy weight. A healthy diet includes low-fat dairy products, low-fat (lean) meats, and fiber from whole grains, beans, and lots of fruits and vegetables. Stay current with your vaccines. Schedule regular health, dental, and eye exams. Summary Having a healthy lifestyle and getting preventive care can help to protect your health and wellness after age 16. Screening and testing are the best way to find a health problem early and help you avoid having a fall. Early diagnosis and treatment give you the best chance for managing medical conditions that are more common for people who are older than age 54. Falls are a major cause of broken bones and head injuries in people who are older than age 72. Take precautions to prevent a fall at home. Work with your health care  provider to learn what changes you can make to improve your health and wellness and to prevent falls. This information is not intended to replace advice given to you by your health care provider. Make sure you discuss any questions you have with your health care provider. Document Revised: 08/30/2020 Document Reviewed: 08/30/2020 Elsevier Patient Education  [place].

## 2022-06-05 NOTE — Progress Notes (Signed)
Subjective:   Bill Torres is a 67 y.o. male who presents for Medicare Annual/Subsequent preventive examination.  Review of Systems     I connected with Bill Torres on 06/05/2022 at 9:09 am  by telephone and verified that I am speaking with the correct person using two identifiers. I discussed the limitations, risks, security and privacy concerns of performing an evaluation and management service by telephone and the availability of in person appointments. I also discussed with the patient that there may be a patient responsible charge related to this service. The patient expressed understanding and agreed to proceed.   Patient location: Home My Location: [place] on the telephone call: Myself and [name]     Cardiac Risk Factors include: none     Objective:    There were no vitals filed for this visit. There is no height or weight on file to calculate BMI.     06/05/2022    9:11 AM 09/13/2020    9:03 AM  Advanced Directives  Does Patient Have a Medical Advance Directive? No No  Would patient like information on creating a medical advance directive? No - Patient declined     Current Medications (verified) Outpatient Encounter Medications as of 06/05/2022  Medication Sig   Accu-Chek Softclix Lancets lancets Use to check blood sugar three times a day   Blood Glucose Monitoring Suppl (ACCU-CHEK GUIDE) w/Device KIT Use to check blood sugar three times a day   empagliflozin (JARDIANCE) 10 MG TABS tablet Take 1 tablet (10 mg total) by mouth daily.   gabapentin (NEURONTIN) 300 MG capsule Take 2 capsules (600 mg total) by mouth 2 (two) times daily.   glucose blood (ACCU-CHEK GUIDE) test strip USE TO TEST BLOOD SUGAR THREE TIMES A DAY   insulin glargine (LANTUS SOLOSTAR) 100 UNIT/ML Solostar Pen Inject 45 Units into the skin daily. Increase by 2 units every 4th day until blood sugars are at goal, max daily dose 55 units   Insulin Pen Needle  (TRUEPLUS 5-BEVEL PEN NEEDLES) 32G X 4 MM MISC Use to inject Lantus and Ozempic.   ondansetron (ZOFRAN) 4 MG tablet Take 1 tablet (4 mg total) by mouth every 8 (eight) hours as needed.   oxyCODONE-acetaminophen (PERCOCET/ROXICET) 5-325 MG tablet Take 1 tablet by mouth every 4 (four) hours as needed for severe pain.   rosuvastatin (CRESTOR) 40 MG tablet Take 1 tablet (40 mg total) by mouth daily.   Semaglutide, 1 MG/DOSE, 4 MG/3ML SOPN Inject 1 mg as directed once a week.   No facility-administered encounter medications on file as of 06/05/2022.    Allergies (verified) Patient has no known allergies.   History: Past Medical History:  Diagnosis Date   Cataract    Dental abscess 12/15/2019   Hyperlipidemia associated with type 2 diabetes mellitus ([place]) 12/14/2019   Past Surgical History:  Procedure Laterality Date   WISDOM TOOTH EXTRACTION     Family History  Problem Relation Age of Onset   Colon cancer Neg Hx    Esophageal cancer Neg Hx    Rectal cancer Neg Hx    Stomach cancer Neg Hx    Colon polyps Neg Hx    Social History   Socioeconomic History   Marital status: Single    Spouse name: Not on file   Number of children: Not on file   Years of education: Not on file   Highest education level: Not on file  Occupational History   Not on file  Tobacco Use   Smoking status: Never   Smokeless tobacco: Never  Vaping Use   Vaping Use: Never used  Substance and Sexual Activity   Alcohol use: Never   Drug use: Never   Sexual activity: Not on file  Other Topics Concern   Not on file  Social History Narrative   Not on file   Social Determinants of Health   Financial Resource Strain: Low Risk  (06/05/2022)   Overall Financial Resource Strain (CARDIA)    Difficulty of Paying Living Expenses: Not hard at all  Food Insecurity: No Food Insecurity (06/05/2022)   Hunger Vital Sign    Worried About Running Out of Food in the Last Year: Never true    Ran Out of Food in the Last  Year: Never true  Transportation Needs: No Transportation Needs (06/05/2022)   PRAPARE - Hydrologist (Medical): No    Lack of Transportation (Non-Medical): No  Physical Activity: Sufficiently Active (06/05/2022)   Exercise Vital Sign    Days of Exercise per Week: 7 days    Minutes of Exercise per Session: 30 min  Stress: No Stress Concern Present (06/05/2022)   [place]    Feeling of Stress : Not at all  Social Connections: Moderately Integrated (06/05/2022)   Social Connection and Isolation Panel [NHANES]    Frequency of Communication with Friends and Family: Three times a week    Frequency of Social Gatherings with Friends and Family: Three times a week    Attends Religious Services: More than 4 times per year    Active Member of Clubs or Organizations: Yes    Attends Archivist Meetings: Never    Marital Status: Divorced    Tobacco Counseling Counseling given: No   Clinical Intake:  Pre-visit preparation completed: No  Pain : No/denies pain     Nutritional Risks: None Diabetes: Yes CBG done?: No  How often do you need to have someone help you when you read instructions, pamphlets, or other written materials from your doctor or pharmacy?: 1 - Never  Diabetic?Yes  Interpreter Needed?: No      Activities of Daily Living    06/05/2022    9:25 AM  In your present state of health, do you have any difficulty performing the following activities:  Hearing? 0  Vision? 0  Difficulty concentrating or making decisions? 0  Walking or climbing stairs? 0  Dressing or bathing? 0  Doing errands, shopping? 0  Preparing Food and eating ? N  Using the Toilet? N  In the past six months, have you accidently leaked urine? N  Do you have problems with loss of bowel control? N  Managing your Medications? N  Managing your Finances? N  Housekeeping or managing your  Housekeeping? N    Patient Care Team: Charlott Rakes, MD as PCP - General (Family Medicine)  Indicate any recent Medical Services you may have received from other than [facility] providers in the past year (date may be approximate).     Assessment:   This is a routine wellness examination for Bill Torres.  Hearing/Vision screen No results found.  Dietary issues and exercise activities discussed: Current Exercise Habits: Home exercise routine, Type of exercise: walking, Time (Minutes): 30, Frequency (Times/Week): 7, Weekly Exercise (Minutes/Week): 210, Intensity: Mild, Exercise limited by: None identified   Goals Addressed   None    Depression Screen    06/05/2022    9:12  AM 08/29/2021    9:41 AM 03/01/2021    1:58 PM 06/08/2020    4:04 PM 03/09/2020    4:08 PM 01/07/2020   10:53 AM  PHQ 2/9 Scores  PHQ - 2 Score 0 0 0 0 0 0  PHQ- 9 Score  0 0 0 0     Fall Risk    06/05/2022    9:11 AM 03/06/2022    8:46 AM 08/29/2021    9:23 AM 03/01/2021    1:58 PM 09/08/2020    3:02 PM  Fall Risk   Falls in the past year? 0 0 0 0 0  Number falls in past yr: 0 0 0 0 0  Injury with Fall? 0 0 0 0 0  Risk for fall due to : No Fall Risks No Fall Risks       FALL RISK PREVENTION PERTAINING TO THE HOME:  Any stairs in or around the home? Yes  If so, are there any without handrails? No  Home free of loose throw rugs in walkways, pet beds, electrical cords, etc? Yes  Adequate lighting in your home to reduce risk of falls? Yes   ASSISTIVE DEVICES UTILIZED TO PREVENT FALLS:  Life alert? No  Use of a cane, walker or w/c? No  Grab bars in the bathroom? No  Shower chair or bench in shower? No  Elevated toilet seat or a handicapped toilet? No   TIMED UP AND GO:  Was the test performed? No .  Length of time to ambulate 10 feet: n/a sec.   Gait slow and steady without use of assistive device  Cognitive Function:    06/05/2022    9:15 AM  MMSE - Mini Mental State Exam  Orientation to time 5   Orientation to Place 5  Registration 3  Attention/ Calculation 5  Recall 3  Language- name 2 objects 2  Language- repeat 1  Language- follow 3 step command 3  Language- read & follow direction 1  Write a sentence 1  Copy design 1  Total score 30        06/05/2022    9:15 AM  6CIT Screen  What Year? 0 points  What month? 0 points  What time? 0 points  Count back from 20 0 points  Months in reverse 4 points  Repeat phrase 2 points  Total Score 6 points    Immunizations Immunization History  Administered Date(s) Administered   Moderna Sars-Covid-2 Vaccination 12/17/2019, 07/20/2020    TDAP status: Due, Education has been provided regarding the importance of this vaccine. Advised may receive this vaccine at local pharmacy or Health Dept. Aware to provide a copy of the vaccination record if obtained from local pharmacy or Health Dept. Verbalized acceptance and understanding. Patient declined  Flu Vaccine status: Declined, Education has been provided regarding the importance of this vaccine but patient still declined. Advised may receive this vaccine at local pharmacy or Health Dept. Aware to provide a copy of the vaccination record if obtained from local pharmacy or Health Dept. Verbalized acceptance and understanding.  Pneumococcal vaccine status: Declined,  Education has been provided regarding the importance of this vaccine but patient still declined. Advised may receive this vaccine at local pharmacy or Health Dept. Aware to provide a copy of the vaccination record if obtained from local pharmacy or Health Dept. Verbalized acceptance and understanding.   Covid-19 vaccine status: Completed vaccines  Qualifies for Shingles Vaccine? Yes   Zostavax completed No   Shingrix  Completed?: No.    Education has been provided regarding the importance of this vaccine. Patient has been advised to call insurance company to determine out of pocket expense if they have not yet received this  vaccine. Advised may also receive vaccine at local pharmacy or Health Dept. Verbalized acceptance and understanding. Patient declined  Screening Tests Health Maintenance  Topic Date Due   Medicare Annual Wellness (AWV)  Never done   COVID-19 Vaccine (3 - 2023-24 season) 12/23/2021   Zoster Vaccines- Shingrix (1 of 2) 06/06/2022 (Originally 12/29/2005)   INFLUENZA VACCINE  07/23/2022 (Originally 11/22/2021)   Pneumonia Vaccine 87+ Years old (1 of 1 - PCV) 03/07/2023 (Originally 12/29/2020)   HEMOGLOBIN A1C  09/04/2022   OPHTHALMOLOGY EXAM  01/03/2023   FOOT EXAM  03/07/2023   Diabetic kidney evaluation - eGFR measurement  03/09/2023   Diabetic kidney evaluation - Urine ACR  03/09/2023   COLONOSCOPY (Pts 45-39yr Insurance coverage will need to be confirmed)  10/19/2027   Hepatitis C Screening  Completed   HPV VACCINES  Aged Out   DTaP/Tdap/Td  Discontinued    Health Maintenance  Health Maintenance Due  Topic Date Due   Medicare Annual Wellness (AWV)  Never done   COVID-19 Vaccine (3 - 2023-24 season) 12/23/2021    Colorectal cancer screening: Type of screening: Colonoscopy. Completed 10/18/2020. Repeat every 7 years  Lung Cancer Screening: (Low Dose CT Chest recommended if Age 872-80years, 30 pack-year currently smoking OR have quit w/in 15years.) does not qualify.   Lung Cancer Screening Referral: n/a   Additional Screening:  Hepatitis C Screening: does qualify; Completed 12/15/2019  Vision Screening: Recommended annual ophthalmology exams for early detection of glaucoma and other disorders of the eye. Is the patient up to date with their annual eye exam?  Yes  Who is the provider or what is the name of the office in which the patient attends annual eye exams? N/a If pt is not established with a provider, would they like to be referred to a provider to establish care? No .   Dental Screening: Recommended annual dental exams for proper oral hygiene  Community Resource Referral /  Chronic Care Management: CRR required this visit?  No   CCM required this visit?  No      Plan:     I have personally reviewed and noted the following in the patient's chart:   Medical and social history Use of alcohol, tobacco or illicit drugs  Current medications and supplements including opioid prescriptions. Patient is currently taking opioid prescriptions. Information provided to patient regarding non-opioid alternatives. Patient advised to discuss non-opioid treatment plan with their provider. Functional ability and status Nutritional status Physical activity Advanced directives List of other physicians Hospitalizations, surgeries, and ER visits in previous 12 months Vitals Screenings to include cognitive, depression, and falls Referrals and appointments  In addition, I have reviewed and discussed with patient certain preventive protocols, quality metrics, and best practice recommendations. A written personalized care plan for preventive services as well as general preventive health recommendations were provided to patient.     AGomez Cleverly CDISH  06/05/2022   Nurse Notes: I spent 30 minutes on this telephone encounter AVS mailed to patinet

## 2022-06-13 ENCOUNTER — Other Ambulatory Visit: Payer: Self-pay

## 2022-06-16 ENCOUNTER — Other Ambulatory Visit: Payer: Self-pay

## 2022-06-19 ENCOUNTER — Other Ambulatory Visit: Payer: Self-pay

## 2022-06-20 ENCOUNTER — Other Ambulatory Visit: Payer: Self-pay

## 2022-07-31 ENCOUNTER — Other Ambulatory Visit: Payer: Self-pay

## 2022-08-01 ENCOUNTER — Other Ambulatory Visit: Payer: Self-pay

## 2022-08-08 IMAGING — CT CT RENAL STONE PROTOCOL
2 of 4 series · 16 of 46 positions shown, 18 images · non-contrast
Comparison: None.

CLINICAL DATA: 64-year-old male with left flank pain radiating to
the abdomen.

EXAM:
CT ABDOMEN AND PELVIS WITHOUT CONTRAST
TECHNIQUE: Multidetector CT imaging of the abdomen and pelvis was performed
following the standard protocol without IV contrast.

[Series 3: renal stone 5.0 · axial · 0.89mm/px · z∈[+932,+1387]mm · 13 of 101 slices shown, 15 images]
[im 5/101  soft-tissue]
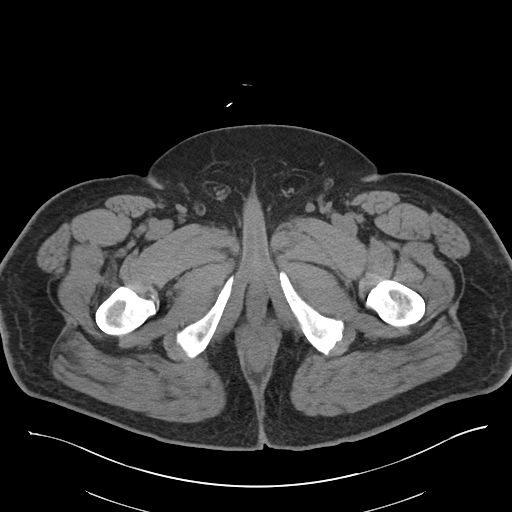
[im 5/101  bone]
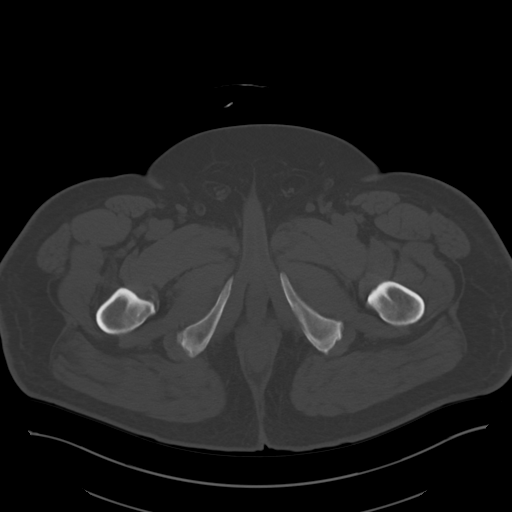
[im 14/101  soft-tissue]
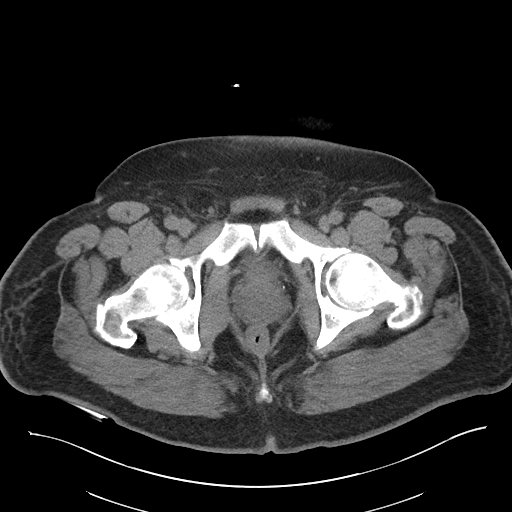
[im 22/101  soft-tissue]
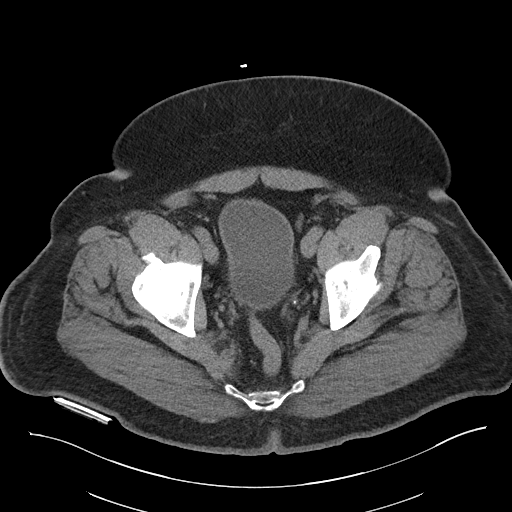
[im 27/101  soft-tissue]
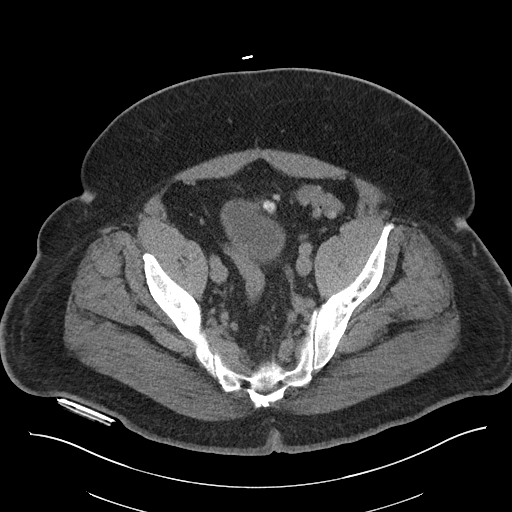
[im 35/101  soft-tissue]
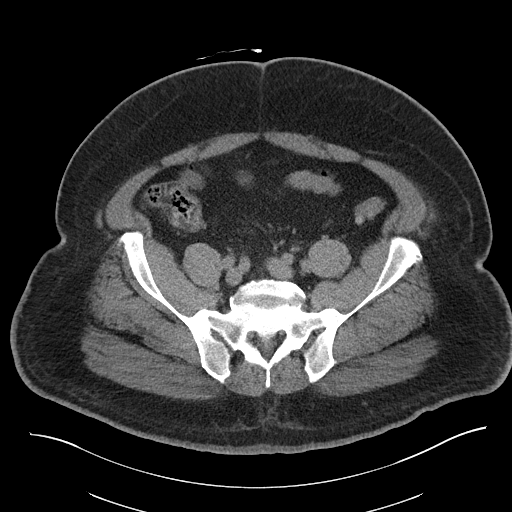
[im 44/101  soft-tissue]
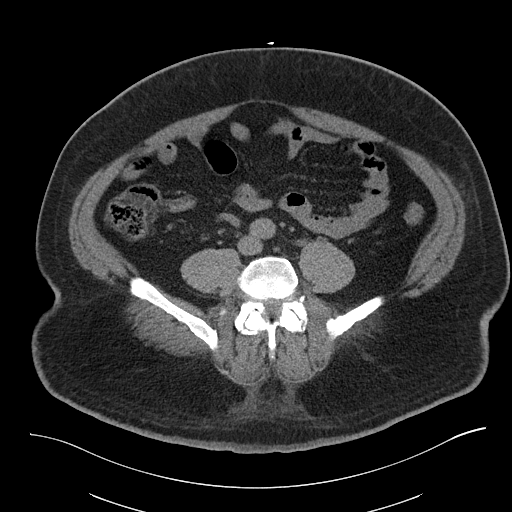
[im 53/101  soft-tissue]
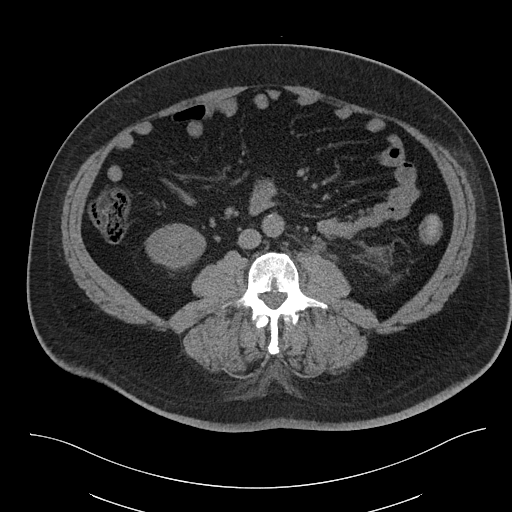
[im 57/101  soft-tissue]
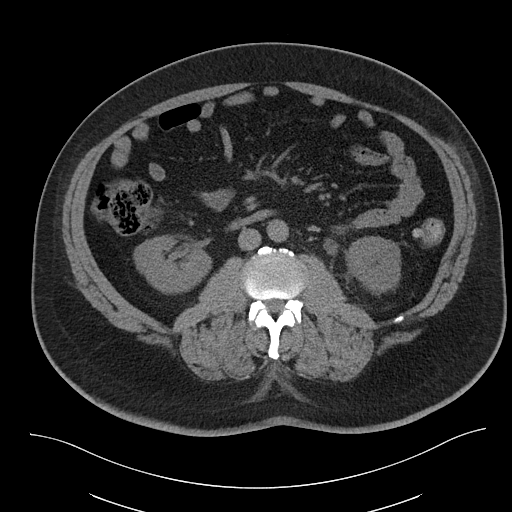
[im 66/101  soft-tissue]
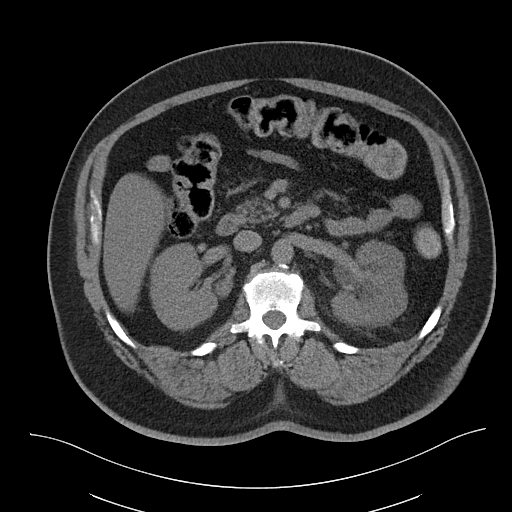
[im 66/101  bone]
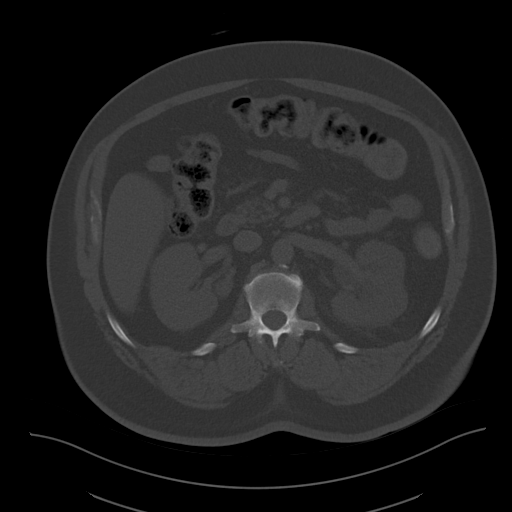
[im 74/101  soft-tissue]
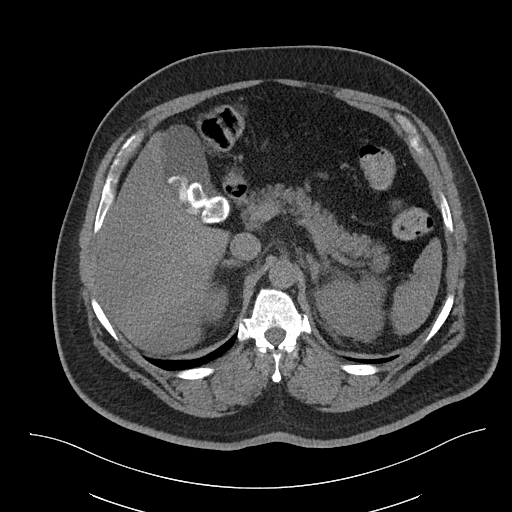
[im 79/101  soft-tissue]
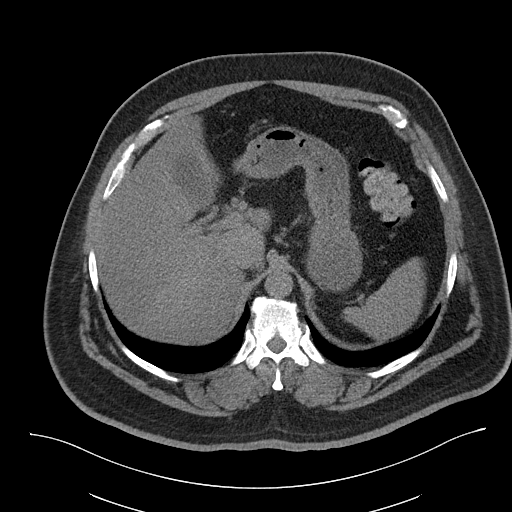
[im 87/101  soft-tissue]
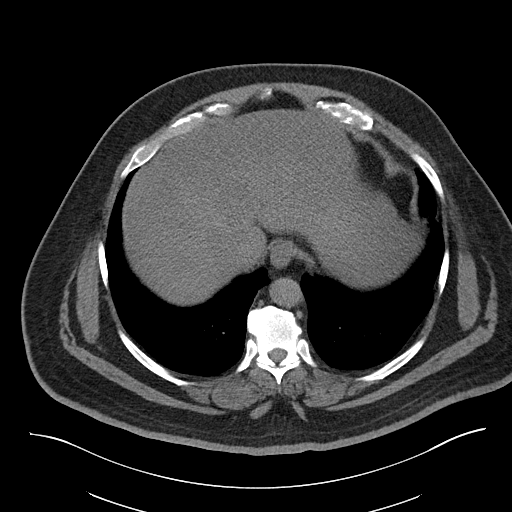
[im 96/101  soft-tissue]
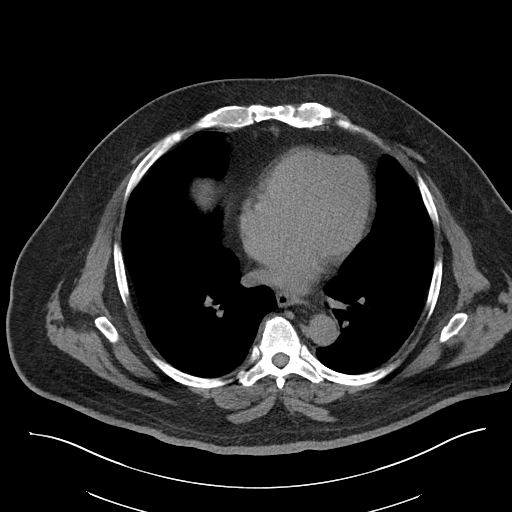

[Series 6: coronal · coronal · 0.89mm/px · 3 of 129 slices shown]
[im 43/129  soft-tissue]
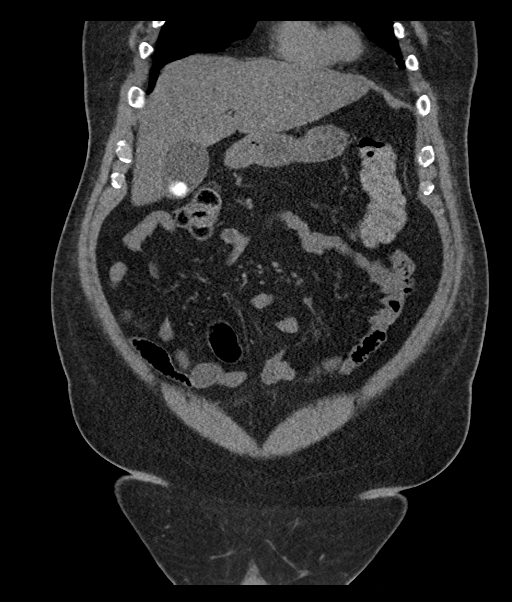
[im 57/129  soft-tissue]
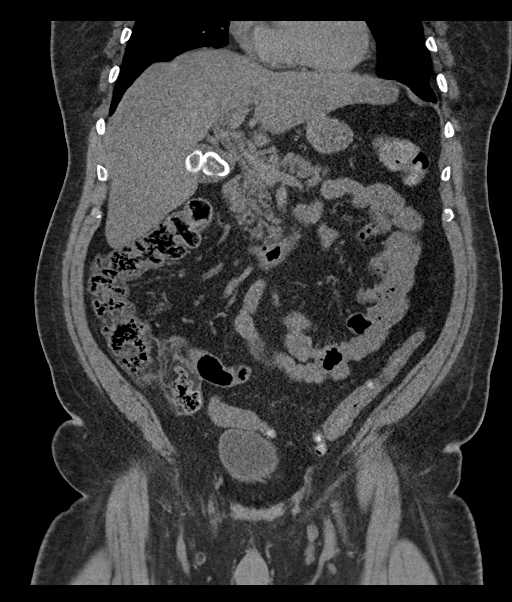
[im 72/129  soft-tissue]
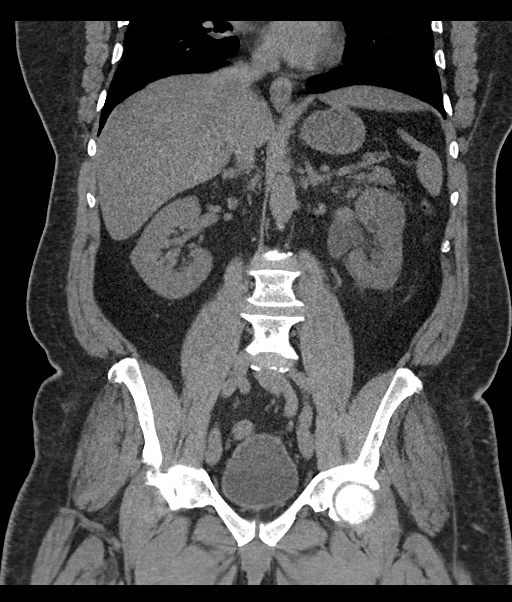

[16 of 46 positions shown; findings below may reference images not displayed]

FINDINGS: Lower chest: Negative aside from mildly tortuous descending thoracic
aorta.

Hepatobiliary: Possible hepatic steatosis. Numerous rim calcified
gallstones individually up to 3 cm. No gallbladder inflammation is
evident.

Pancreas: Negative.

Spleen: Negative.

Adrenals/Urinary Tract: Normal adrenal glands.

Punctate right upper pole nephrolithiasis. Otherwise negative
noncontrast right kidney and ureter.

Hydronephrotic left kidney with left perinephric stranding. Left
ureter is dilated and inflamed into the pelvis where elongated or
adjacent calculi measuring up to 7 mm in length are noted in the
distal ureter less than 10 mm from the left UVJ. See series 3, image
80 and coronal image 84. Unremarkable bladder.

Stomach/Bowel: Decompressed large bowel from the splenic flexure
distally. Mild descending and moderate sigmoid colon diverticulosis
with no active inflammation. Negative transverse and right colon.
Normal appendix on series 3, image 59. Decompressed and negative
terminal ileum. No dilated small bowel. Decompressed stomach and
duodenum. No free air, free fluid.

Vascular/Lymphatic: Normal caliber abdominal aorta. Minimal iliac
calcified atherosclerosis.

Reproductive: Negative.

Other: No pelvic free fluid.  Numerous pelvic phleboliths.

Musculoskeletal: Lower lumbar facet arthropathy with vacuum facet.
No acute osseous abnormality identified.
IMPRESSION: 1. Acute obstructive uropathy on the left with clustered or single
elongated stone in the distal ureter measuring 7 mm in length just
proximal to the left UVJ. Possible forniceal rupture.
2. Punctate right nephrolithiasis.
3. Cholelithiasis without CT evidence of acute cholecystitis.
4. Diverticulosis of the descending and sigmoid colon with no active
inflammation.

## 2022-08-19 ENCOUNTER — Other Ambulatory Visit: Payer: Self-pay

## 2022-08-20 ENCOUNTER — Other Ambulatory Visit (HOSPITAL_COMMUNITY): Payer: Self-pay

## 2022-08-21 ENCOUNTER — Other Ambulatory Visit: Payer: Self-pay

## 2022-08-21 ENCOUNTER — Other Ambulatory Visit (HOSPITAL_COMMUNITY): Payer: Self-pay

## 2022-08-22 ENCOUNTER — Other Ambulatory Visit (HOSPITAL_COMMUNITY): Payer: Self-pay

## 2022-08-25 ENCOUNTER — Other Ambulatory Visit: Payer: Self-pay | Admitting: Family Medicine

## 2022-08-25 ENCOUNTER — Other Ambulatory Visit: Payer: Self-pay

## 2022-08-25 ENCOUNTER — Other Ambulatory Visit (HOSPITAL_COMMUNITY): Payer: Self-pay

## 2022-08-25 MED ORDER — TECHLITE PEN NEEDLES 32G X 4 MM MISC
0 refills | Status: DC
  Filled 2022-08-25: qty 100, 100d supply, fill #0
  Filled 2022-08-25: qty 100, fill #0

## 2022-08-28 ENCOUNTER — Other Ambulatory Visit: Payer: Self-pay

## 2022-09-05 ENCOUNTER — Encounter: Payer: Self-pay | Admitting: Neurology

## 2022-09-05 ENCOUNTER — Other Ambulatory Visit: Payer: Self-pay

## 2022-09-05 ENCOUNTER — Ambulatory Visit: Payer: 59 | Attending: Family Medicine | Admitting: Family Medicine

## 2022-09-05 ENCOUNTER — Encounter: Payer: Self-pay | Admitting: Family Medicine

## 2022-09-05 VITALS — BP 123/76 | HR 103 | Ht 73.0 in | Wt 293.8 lb

## 2022-09-05 DIAGNOSIS — E1149 Type 2 diabetes mellitus with other diabetic neurological complication: Secondary | ICD-10-CM | POA: Diagnosis not present

## 2022-09-05 DIAGNOSIS — R519 Headache, unspecified: Secondary | ICD-10-CM | POA: Diagnosis not present

## 2022-09-05 DIAGNOSIS — E785 Hyperlipidemia, unspecified: Secondary | ICD-10-CM

## 2022-09-05 DIAGNOSIS — Z794 Long term (current) use of insulin: Secondary | ICD-10-CM

## 2022-09-05 DIAGNOSIS — E1169 Type 2 diabetes mellitus with other specified complication: Secondary | ICD-10-CM

## 2022-09-05 LAB — POCT GLYCOSYLATED HEMOGLOBIN (HGB A1C): HbA1c, POC (controlled diabetic range): 6.3 % (ref 0.0–7.0)

## 2022-09-05 MED ORDER — LANTUS SOLOSTAR 100 UNIT/ML ~~LOC~~ SOPN
36.0000 [IU] | PEN_INJECTOR | Freq: Every day | SUBCUTANEOUS | 6 refills | Status: DC
  Filled 2022-09-05 – 2022-11-07 (×3): qty 30, 83d supply, fill #0
  Filled 2023-02-12: qty 30, 83d supply, fill #1

## 2022-09-05 MED ORDER — ROSUVASTATIN CALCIUM 40 MG PO TABS
40.0000 mg | ORAL_TABLET | Freq: Every day | ORAL | 1 refills | Status: DC
  Filled 2022-09-05: qty 90, 90d supply, fill #0
  Filled 2022-12-04 (×2): qty 90, 90d supply, fill #1

## 2022-09-05 MED ORDER — EMPAGLIFLOZIN 10 MG PO TABS
10.0000 mg | ORAL_TABLET | Freq: Every day | ORAL | 1 refills | Status: DC
  Filled 2022-09-05: qty 90, 90d supply, fill #0
  Filled 2022-12-04 (×2): qty 90, 90d supply, fill #1

## 2022-09-05 MED ORDER — DULOXETINE HCL 60 MG PO CPEP
60.0000 mg | ORAL_CAPSULE | Freq: Every day | ORAL | 1 refills | Status: DC
  Filled 2022-09-05: qty 90, 90d supply, fill #0
  Filled 2022-12-04 (×2): qty 90, 90d supply, fill #1

## 2022-09-05 NOTE — Progress Notes (Signed)
Pain in the left side of head while taking gabapentin.

## 2022-09-05 NOTE — Progress Notes (Addendum)
Subjective:  Patient ID: Bill Torres, male    DOB: 06-17-1955  Age: 67 y.o. MRN: [id number]  CC: Diabetes   HPI Bill Torres is a 67 y.o. year old male with a history of obesity, type 2 diabetes mellitus (A1c 6.4 diagnosed in 11/2019), hyperlipidemia here for chronic disease management.  Interval History:   He Complains of pain on the left side of his face and assumed it was Gabapentin causing it and after discontinuing it,  symptoms were not as frequent. Pain is still present and is described as intermittent, stabbing (like someone stabbing him in his eye). When it hits he sometimes almost falls over as it is sudden. He has no visual loss, rhinorrhea, erythema of hs eye.,  No headache, no nausea or dizziness.  He has no sensitivity of his scalp and has no difficulty opening his jaw.  Symptoms are absent at the moment.  He continues to experience neuropathy symptoms and he has to wear compression stocking due to the pain. He is currently applying for disability.  His diabetes is controlled on his current regimen and he has had no hypoglycemia.  He has an upcoming appointment with his ophthalmologist an upcoming appointment with a dentist. He has no chest pain or dyspnea. Past Medical History:  Diagnosis Date   Cataract    Dental abscess 12/15/2019   Hyperlipidemia associated with type 2 diabetes mellitus (HCC) 12/14/2019    Past Surgical History:  Procedure Laterality Date   WISDOM TOOTH EXTRACTION      Family History  Problem Relation Age of Onset   Colon cancer Neg Hx    Esophageal cancer Neg Hx    Rectal cancer Neg Hx    Stomach cancer Neg Hx    Colon polyps Neg Hx     Social History   Socioeconomic History   Marital status: Single    Spouse name: Not on file   Number of children: Not on file   Years of education: Not on file   Highest education level: Not on file  Occupational History   Not on file  Tobacco Use   Smoking status: Never    Smokeless tobacco: Never  Vaping Use   Vaping Use: Never used  Substance and Sexual Activity   Alcohol use: Never   Drug use: Never   Sexual activity: Not on file  Other Topics Concern   Not on file  Social History Narrative   Not on file   Social Determinants of Health   Financial Resource Strain: Low Risk  (06/05/2022)   Overall Financial Resource Strain (CARDIA)    Difficulty of Paying Living Expenses: Not hard at all  Food Insecurity: No Food Insecurity (06/05/2022)   Hunger Vital Sign    Worried About Running Out of Food in the Last Year: Never true    Ran Out of Food in the Last Year: Never true  Transportation Needs: No Transportation Needs (06/05/2022)   PRAPARE - Administrator, Civil Service (Medical): No    Lack of Transportation (Non-Medical): No  Physical Activity: Sufficiently Active (06/05/2022)   Exercise Vital Sign    Days of Exercise per Week: 7 days    Minutes of Exercise per Session: 30 min  Stress: No Stress Concern Present (06/05/2022)   [name]    Feeling of Stress : Not at all  Social Connections: Moderately Integrated (06/05/2022)   Social Connection and Isolation Panel [NHANES]  Frequency of Communication with Friends and Family: Three times a week    Frequency of Social Gatherings with Friends and Family: Three times a week    Attends Religious Services: More than 4 times per year    Active Member of Clubs or Organizations: Yes    Attends Banker Meetings: Never    Marital Status: Divorced    No Known Allergies  Outpatient Medications Prior to Visit  Medication Sig Dispense Refill   Accu-Chek Softclix Lancets lancets Use to check blood sugar three times a day 100 each 2   Blood Glucose Monitoring Suppl (ACCU-CHEK GUIDE) w/Device KIT Use to check blood sugar three times a day 1 kit 0   glucose blood (ACCU-CHEK GUIDE) test strip USE TO TEST BLOOD SUGAR  THREE TIMES A DAY 100 strip 2   Insulin Pen Needle (TECHLITE PEN NEEDLES) 32G X 4 MM MISC Use to inject Lantus and Ozempic. 100 each 0   empagliflozin (JARDIANCE) 10 MG TABS tablet Take 1 tablet (10 mg total) by mouth daily. 90 tablet 1   insulin glargine (LANTUS SOLOSTAR) 100 UNIT/ML Solostar Pen Inject 45 Units into the skin daily. Increase by 2 units every 4th day until blood sugars are at goal, max daily dose 55 units 30 mL 6   rosuvastatin (CRESTOR) 40 MG tablet Take 1 tablet (40 mg total) by mouth daily. 90 tablet 1   Semaglutide, 1 MG/DOSE, 4 MG/3ML SOPN Inject 1 mg as directed once a week. 3 mL 6   gabapentin (NEURONTIN) 300 MG capsule Take 2 capsules (600 mg total) by mouth 2 (two) times daily. (Patient not taking: Reported on 09/05/2022) 360 capsule 1   ondansetron (ZOFRAN) 4 MG tablet Take 1 tablet (4 mg total) by mouth every 8 (eight) hours as needed. (Patient not taking: Reported on 09/05/2022) 12 tablet 0   oxyCODONE-acetaminophen (PERCOCET/ROXICET) 5-325 MG tablet Take 1 tablet by mouth every 4 (four) hours as needed for severe pain. (Patient not taking: Reported on 09/05/2022) 15 tablet 0   No facility-administered medications prior to visit.     ROS Review of Systems  Constitutional:  Negative for activity change and appetite change.  HENT:  Negative for sinus pressure and sore throat.   Respiratory:  Negative for chest tightness, shortness of breath and wheezing.   Cardiovascular:  Negative for chest pain and palpitations.  Gastrointestinal:  Negative for abdominal distention, abdominal pain and constipation.  Genitourinary: Negative.   Musculoskeletal: Negative.   Neurological:  Positive for numbness.  Psychiatric/Behavioral:  Negative for behavioral problems and dysphoric mood.     Objective:  BP 123/76   Pulse (!) 103   Ht 6\' 1"  (1.854 m)   Wt 293 lb 12.8 oz (133.3 kg)   SpO2 97%   BMI 38.76 kg/m      09/05/2022    8:49 AM 03/06/2022    8:44 AM 08/29/2021     9:24 AM  BP/Weight  Systolic BP 123 127 137  Diastolic BP 76 81 76  Wt. (Lbs) 293.8 294.8 310  BMI 38.76 kg/m2 38.89 kg/m2 40.9 kg/m2      Physical Exam Constitutional:      Appearance: He is well-developed.  HENT:     Mouth/Throat:     Comments: No tenderness in the TMJ joint and no TMJ joint clicking Cardiovascular:     Rate and Rhythm: Tachycardia present.     Heart sounds: Normal heart sounds. No murmur heard. Pulmonary:     Effort: Pulmonary  effort is normal.     Breath sounds: Normal breath sounds. No wheezing or rales.  Chest:     Chest wall: No tenderness.  Abdominal:     General: Bowel sounds are normal. There is no distension.     Palpations: Abdomen is soft. There is no mass.     Tenderness: There is no abdominal tenderness.  Musculoskeletal:        General: Normal range of motion.     Right lower leg: No edema.     Left lower leg: No edema.  Neurological:     Mental Status: He is alert and oriented to person, place, and time.     Cranial Nerves: No cranial nerve deficit.     Sensory: No sensory deficit.     Motor: No weakness.     Coordination: Coordination normal.     Gait: Gait normal.     Comments: Normal sensation on both sides of his face  Psychiatric:        Mood and Affect: Mood normal.        Latest Ref Rng & Units 03/08/2022    9:29 AM 08/29/2021   10:13 AM 03/08/2021    8:49 AM  CMP  Glucose 70 - 99 mg/dL 90  657  846   BUN 8 - 27 mg/dL 7  9  9    Creatinine 0.76 - 1.27 mg/dL 9.62  9.52  8.41   Sodium 134 - 144 mmol/L 141  143  142   Potassium 3.5 - 5.2 mmol/L 4.5  4.4  4.7   Chloride 96 - 106 mmol/L 102  105  102   CO2 20 - 29 mmol/L 26  23  24    Calcium 8.6 - 10.2 mg/dL 9.9  9.4  9.3   Total Protein 6.0 - 8.5 g/dL 7.7  7.2  7.5   Total Bilirubin 0.0 - 1.2 mg/dL 0.5  0.5  0.9   Alkaline Phos 44 - 121 IU/L 107  115  133   AST 0 - 40 IU/L 20  23  25    ALT 0 - 44 IU/L 31  34  33     Lipid Panel     Component Value Date/Time   CHOL  126 03/08/2022 0929   TRIG 150 (H) 03/08/2022 0929   HDL 31 (L) 03/08/2022 0929   CHOLHDL 7.8 (H) 06/16/2020 1130   CHOLHDL 9.2 12/13/2019 1647   VLDL 60 (H) 12/13/2019 1647   LDLCALC 69 03/08/2022 0929    CBC    Component Value Date/Time   WBC 10.6 (H) 09/13/2020 0754   RBC 5.11 09/13/2020 0754   HGB 15.6 09/13/2020 0754   HGB 14.4 01/07/2020 1111   HCT 45.9 09/13/2020 0754   HCT 44.1 01/07/2020 1111   PLT 232 09/13/2020 0754   PLT 235 01/07/2020 1111   MCV 89.8 09/13/2020 0754   MCV 90 01/07/2020 1111   MCH 30.5 09/13/2020 0754   MCHC 34.0 09/13/2020 0754   RDW 13.3 09/13/2020 0754   RDW 12.8 01/07/2020 1111   LYMPHSABS 2.6 01/07/2020 1111   MONOABS 1.0 12/13/2019 1647   EOSABS 0.3 01/07/2020 1111   BASOSABS 0.0 01/07/2020 1111    Lab Results  Component Value Date   HGBA1C 6.3 09/05/2022    Assessment & Plan:  1. Type 2 diabetes mellitus with other specified complication, with long-term current use of insulin (HCC) Controlled with A1c of 6.4 Continue current regimen Counseled on Diabetic diet, my plate method, 324  minutes of moderate intensity exercise/week Blood sugar logs with fasting goals of 80-120 mg/dl, random of less than 161 and in the event of sugars less than 60 mg/dl or greater than 096 mg/dl encouraged to notify the clinic. Advised on the need for annual eye exams, annual foot exams, Pneumonia vaccine. - insulin glargine (LANTUS SOLOSTAR) 100 UNIT/ML Solostar Pen; Inject 36 Units into the skin at bedtime.  Dispense: 30 mL; Refill: 6 - empagliflozin (JARDIANCE) 10 MG TABS tablet; Take 1 tablet (10 mg total) by mouth daily.  Dispense: 90 tablet; Refill: 1 - Microalbumin / creatinine urine ratio; Future - LP+Non-HDL Cholesterol; Future - CMP14+EGFR; Future - POCT glycosylated hemoglobin (Hb A1C)  2. Hyperlipidemia associated with type 2 diabetes mellitus (HCC) LDL is at goal Continue statin Low-cholesterol diet - rosuvastatin (CRESTOR) 40 MG tablet;  Take 1 tablet (40 mg total) by mouth daily.  Dispense: 90 tablet; Refill: 1  3. Facial pain Symptoms are not in keeping with trigeminal neuralgia or temporal arteritis At the moment he is asymptomatic He is of the opinion that this is from gabapentin and I have discontinued it - CT HEAD WO CONTRAST ( ); Future - Ambulatory referral to Neurology - CBC with Differential/Platelet; Future - Sedimentation Rate; Future  4. Other diabetic neurological complication associated with type 2 diabetes mellitus (HCC) Uncontrolled Discontinued gabapentin per patient request Will initiate Cymbalta after shared decision making. - DULoxetine (CYMBALTA) 60 MG capsule; Take 1 capsule (60 mg total) by mouth daily. For neuropathy  Dispense: 90 capsule; Refill: 1   5.  Morbid obesity Continue to work on caloric restriction and exercise Avoid late meals He is a candidate for GLP-1 RA and we will discuss this at his next visit. Meds ordered this encounter  Medications   DULoxetine (CYMBALTA) 60 MG capsule    Sig: Take 1 capsule (60 mg total) by mouth daily. For neuropathy    Dispense:  90 capsule    Refill:  1    Discontinue Gabapentin   insulin glargine (LANTUS SOLOSTAR) 100 UNIT/ML Solostar Pen    Sig: Inject 36 Units into the skin at bedtime.    Dispense:  30 mL    Refill:  6    Dose decrease   empagliflozin (JARDIANCE) 10 MG TABS tablet    Sig: Take 1 tablet (10 mg total) by mouth daily.    Dispense:  90 tablet    Refill:  1   rosuvastatin (CRESTOR) 40 MG tablet    Sig: Take 1 tablet (40 mg total) by mouth daily.    Dispense:  90 tablet    Refill:  1    Follow-up: Return in about 6 months (around 03/08/2023) for Chronic medical conditions.       Hoy Register, MD, FAAFP. [facility] [place], [place] [phone]   09/05/2022, 11:32 AM

## 2022-09-05 NOTE — Patient Instructions (Signed)
Neuropathic Pain Neuropathic pain is pain caused by damage to the nerves that are responsible for certain sensations in your body (sensory nerves). Neuropathic pain can make you more sensitive to pain. Even a minor sensation can feel very painful. This is usually a long-term (chronic) condition that can be difficult to treat. The type of pain differs from person to person. It may: Start suddenly (acute), or it may develop slowly and become chronic. Come and go as damaged nerves heal, or it may stay at the same level for years. Cause emotional distress, loss of sleep, and a lower quality of life. What are the causes? The most common cause of this condition is diabetes. Many other diseases and conditions can also cause neuropathic pain. Causes of neuropathic pain can be classified as: Toxic. This is caused by medicines and chemicals. The most common causes of toxic neuropathic pain is damage from medicines that kill cancer cells (chemotherapy) or alcohol abuse. Metabolic. This can be caused by: Diabetes. Lack of vitamins like B12. Traumatic. Any injury that cuts, crushes, or stretches a nerve can cause damage and pain. Compression-related. If a sensory nerve gets trapped or compressed for a long period of time, the blood supply to the nerve can be cut off. Vascular. Many blood vessel diseases can cause neuropathic pain by decreasing blood supply and oxygen to nerves. Autoimmune. This type of pain results from diseases in which the body's defense system (immune system) mistakenly attacks sensory nerves. Examples of autoimmune diseases that can cause neuropathic pain include lupus and multiple sclerosis. Infectious. Many types of viral infections can damage sensory nerves and cause pain. Shingles infection is a common cause of this type of pain. Inherited. Neuropathic pain can be a symptom of many diseases that are passed down through families (genetic). What increases the risk? You are more likely to  develop this condition if: You have diabetes. You smoke. You drink too much alcohol. You are taking certain medicines, including chemotherapy or medicines that treat immune system disorders. What are the signs or symptoms? The main symptom is pain. Neuropathic pain is often described as: Burning. Shock-like. Stinging. Hot or cold. Itching. How is this diagnosed? No single test can diagnose neuropathic pain. It is diagnosed based on: A physical exam and your symptoms. Your health care provider will ask you about your pain. You may be asked to use a pain scale to describe how bad your pain is. Tests. These may be done to see if you have a cause and location of any nerve damage. They include: Nerve conduction studies and electromyography to test how well nerve signals travel through your nerves and muscles (electrodiagnostic testing). Skin biopsy to evaluate for small fiber neuropathy. Imaging studies, such as: X-rays. CT scan. MRI. How is this treated? Treatment for neuropathic pain may change over time. You may need to try different treatment options or a combination of treatments. Some options include: Treating the underlying cause of the neuropathy, such as diabetes, kidney disease, or vitamin deficiencies. Stopping medicines that can cause neuropathy, such as chemotherapy. Medicine to relieve pain. Medicines may include: Prescription or over-the-counter pain medicine. Anti-seizure medicine. Antidepressant medicines. Pain-relieving patches or creams that are applied to painful areas of skin. A medicine to numb the area (local anesthetic), which can be injected as a nerve block. Transcutaneous nerve stimulation. This uses electrical currents to block painful nerve signals. The treatment is painless. Alternative treatments, such as: Acupuncture. Meditation. Massage. Occupational or physical therapy. Pain management programs. Counseling. Follow   these instructions at  home: Medicines  Take over-the-counter and prescription medicines only as told by your health care provider. Ask your health care provider if the medicine prescribed to you: Requires you to avoid driving or using machinery. Can cause constipation. You may need to take these actions to prevent or treat constipation: Drink enough fluid to keep your urine pale yellow. Take over-the-counter or prescription medicines. Eat foods that are high in fiber, such as beans, whole grains, and fresh fruits and vegetables. Limit foods that are high in fat and processed sugars, such as fried or sweet foods. Lifestyle  Have a good support system at home. Consider joining a chronic pain support group. Do not use any products that contain nicotine or tobacco. These products include cigarettes, chewing tobacco, and vaping devices, such as e-cigarettes. If you need help quitting, ask your health care provider. Do not drink alcohol. General instructions Learn as much as you can about your condition. Work closely with all your health care providers to find the treatment plan that works best for you. Ask your health care provider what activities are safe for you. Keep all follow-up visits. This is important. Contact a health care provider if: Your pain treatments are not working. You are having side effects from your medicines. You are struggling with tiredness (fatigue), mood changes, depression, or anxiety. Get help right away if: You have thoughts of hurting yourself. Get help right away if you feel like you may hurt yourself or others, or have thoughts about taking your own life. Go to your nearest emergency room or: Call 911. Call the National Suicide Prevention Lifeline at 1-800-273-8255 or 988. This is open 24 hours a day. Text the Crisis Text Line at 741741. Summary Neuropathic pain is pain caused by damage to the nerves that are responsible for certain sensations in your body (sensory  nerves). Neuropathic pain may come and go as damaged nerves heal, or it may stay at the same level for years. Neuropathic pain is usually a long-term condition that can be difficult to treat. Consider joining a chronic pain support group. This information is not intended to replace advice given to you by your health care provider. Make sure you discuss any questions you have with your health care provider. Document Revised: 12/06/2020 Document Reviewed: 12/06/2020 Elsevier Patient Education  2023 Elsevier Inc.  

## 2022-09-06 ENCOUNTER — Other Ambulatory Visit (HOSPITAL_COMMUNITY): Payer: Self-pay

## 2022-09-08 ENCOUNTER — Ambulatory Visit: Payer: 59 | Attending: Family Medicine

## 2022-09-08 DIAGNOSIS — R519 Headache, unspecified: Secondary | ICD-10-CM | POA: Diagnosis not present

## 2022-09-08 DIAGNOSIS — E1169 Type 2 diabetes mellitus with other specified complication: Secondary | ICD-10-CM | POA: Diagnosis not present

## 2022-09-08 DIAGNOSIS — Z794 Long term (current) use of insulin: Secondary | ICD-10-CM

## 2022-09-09 LAB — LP+NON-HDL CHOLESTEROL
Cholesterol, Total: 128 mg/dL (ref 100–199)
HDL: 35 mg/dL — ABNORMAL LOW (ref >39)
LDL Chol Calc (NIH): 70 mg/dL (ref 0–99)
Total Non-HDL-Chol (LDL+VLDL): 93 mg/dL (ref 0–129)
Triglycerides: 127 mg/dL (ref 0–149)
VLDL Cholesterol Cal: 23 mg/dL (ref 5–40)

## 2022-09-09 LAB — CBC WITH DIFFERENTIAL/PLATELET
Basophils Absolute: 0 10*3/uL (ref 0.0–0.2)
Basos: 0 %
EOS (ABSOLUTE): 0.1 10*3/uL (ref 0.0–0.4)
Eos: 2 %
Hematocrit: 45.9 % (ref 37.5–51.0)
Hemoglobin: 15.6 g/dL (ref 13.0–17.7)
Immature Grans (Abs): 0 10*3/uL (ref 0.0–0.1)
Immature Granulocytes: 0 %
Lymphocytes Absolute: 2.5 10*3/uL (ref 0.7–3.1)
Lymphs: 38 %
MCH: 29.9 pg (ref 26.6–33.0)
MCHC: 34 g/dL (ref 31.5–35.7)
MCV: 88 fL (ref 79–97)
Monocytes Absolute: 0.4 10*3/uL (ref 0.1–0.9)
Monocytes: 6 %
Neutrophils Absolute: 3.5 10*3/uL (ref 1.4–7.0)
Neutrophils: 54 %
Platelets: 248 10*3/uL (ref 150–450)
RBC: 5.22 x10E6/uL (ref 4.14–5.80)
RDW: 13.3 % (ref 11.6–15.4)
WBC: 6.7 10*3/uL (ref 3.4–10.8)

## 2022-09-09 LAB — CMP14+EGFR
ALT: 26 IU/L (ref 0–44)
AST: 19 IU/L (ref 0–40)
Albumin/Globulin Ratio: 1.4 (ref 1.2–2.2)
Albumin: 4.3 g/dL (ref 3.9–4.9)
Alkaline Phosphatase: 105 IU/L (ref 44–121)
BUN/Creatinine Ratio: 6 — ABNORMAL LOW (ref 10–24)
BUN: 5 mg/dL — ABNORMAL LOW (ref 8–27)
Bilirubin Total: 0.8 mg/dL (ref 0.0–1.2)
CO2: 23 mmol/L (ref 20–29)
Calcium: 9.6 mg/dL (ref 8.6–10.2)
Chloride: 102 mmol/L (ref 96–106)
Creatinine, Ser: 0.82 mg/dL (ref 0.76–1.27)
Globulin, Total: 3.1 g/dL (ref 1.5–4.5)
Glucose: 91 mg/dL (ref 70–99)
Potassium: 4.7 mmol/L (ref 3.5–5.2)
Sodium: 141 mmol/L (ref 134–144)
Total Protein: 7.4 g/dL (ref 6.0–8.5)
eGFR: 97 mL/min/{1.73_m2} (ref >59)

## 2022-09-09 LAB — MICROALBUMIN / CREATININE URINE RATIO
Creatinine, Urine: 166.7 mg/dL
Microalb/Creat Ratio: 11 mg/g creat (ref 0–29)
Microalbumin, Urine: 18.1 ug/mL

## 2022-09-09 LAB — SEDIMENTATION RATE: Sed Rate: 15 mm/hr (ref 0–30)

## 2022-09-20 DIAGNOSIS — Z961 Presence of intraocular lens: Secondary | ICD-10-CM | POA: Diagnosis not present

## 2022-09-20 DIAGNOSIS — E119 Type 2 diabetes mellitus without complications: Secondary | ICD-10-CM | POA: Diagnosis not present

## 2022-09-20 LAB — HM DIABETES EYE EXAM

## 2022-09-26 ENCOUNTER — Telehealth: Payer: Self-pay

## 2022-09-26 NOTE — Telephone Encounter (Signed)
Last office note has been faxed:

## 2022-09-26 NOTE — Telephone Encounter (Signed)
Copied from CRM [id number]. Topic: General - Other >> Sep 26, 2022  9:51 AM Turkey B wrote: Reason for CRM: Marisue Humble from [facility] [employer], about getting  a copy of patients lab visit faxed to them. Fx #[phone]

## 2022-10-03 ENCOUNTER — Ambulatory Visit: Payer: 59 | Admitting: Neurology

## 2022-10-12 ENCOUNTER — Other Ambulatory Visit: Payer: Self-pay | Admitting: Family Medicine

## 2022-10-12 ENCOUNTER — Other Ambulatory Visit: Payer: Self-pay

## 2022-10-12 ENCOUNTER — Other Ambulatory Visit (HOSPITAL_COMMUNITY): Payer: Self-pay

## 2022-10-12 DIAGNOSIS — Z794 Long term (current) use of insulin: Secondary | ICD-10-CM

## 2022-10-12 MED ORDER — OZEMPIC (1 MG/DOSE) 4 MG/3ML ~~LOC~~ SOPN
1.0000 mg | PEN_INJECTOR | SUBCUTANEOUS | 6 refills | Status: DC
Start: 2022-10-12 — End: 2023-03-08
  Filled 2022-10-12 – 2022-11-07 (×5): qty 3, 28d supply, fill #0
  Filled 2022-12-04 (×2): qty 3, 28d supply, fill #1
  Filled 2023-02-12: qty 3, 28d supply, fill #2

## 2022-11-07 ENCOUNTER — Other Ambulatory Visit: Payer: Self-pay

## 2022-12-04 ENCOUNTER — Other Ambulatory Visit: Payer: Self-pay

## 2022-12-04 ENCOUNTER — Other Ambulatory Visit: Payer: Self-pay | Admitting: Family Medicine

## 2022-12-05 ENCOUNTER — Other Ambulatory Visit (HOSPITAL_COMMUNITY): Payer: Self-pay

## 2022-12-05 ENCOUNTER — Other Ambulatory Visit: Payer: Self-pay

## 2022-12-05 MED ORDER — TECHLITE PEN NEEDLES 32G X 4 MM MISC
2 refills | Status: DC
  Filled 2022-12-05: qty 100, 100d supply, fill #0
  Filled 2022-12-05: qty 100, fill #0
  Filled 2022-12-06: qty 100, 90d supply, fill #0
  Filled 2023-01-01: qty 100, 25d supply, fill #0
  Filled 2023-02-12: qty 100, 25d supply, fill #1
  Filled 2023-07-16: qty 100, 100d supply, fill #2

## 2022-12-05 NOTE — Telephone Encounter (Signed)
Requested Prescriptions  Pending Prescriptions Disp Refills   Insulin Pen Needle (TECHLITE PEN NEEDLES) 32G X 4 MM MISC 100 each 2    Sig: Use to inject Lantus and Ozempic.     Endocrinology: Diabetes - Testing Supplies Passed - 12/04/2022  8:44 AM      Passed - Valid encounter within last 12 months    Recent Outpatient Visits           3 months ago Facial pain   [place] [facility] [place], [place], MD   9 months ago Type 2 diabetes mellitus with other specified complication, with long-term current use of insulin (HCC)   [place] [facility] [place], Odette Horns, MD   1 year ago Type 2 diabetes mellitus with other specified complication, with long-term current use of insulin (HCC)   [facility] [place], Odette Horns, MD   1 year ago Type 2 diabetes mellitus with other specified complication, with long-term current use of insulin [facility])   [place] [facility] [place], Odette Horns, MD   2 years ago Type 2 diabetes mellitus with other specified complication, with long-term current use of insulin [facility])   [place] [facility] Hoy Register, MD       Future Appointments             In 3 months Hoy Register, MD [facility]

## 2022-12-06 ENCOUNTER — Other Ambulatory Visit: Payer: Self-pay

## 2022-12-06 ENCOUNTER — Other Ambulatory Visit (HOSPITAL_COMMUNITY): Payer: Self-pay

## 2022-12-08 ENCOUNTER — Other Ambulatory Visit: Payer: Self-pay

## 2023-01-01 ENCOUNTER — Other Ambulatory Visit: Payer: Self-pay

## 2023-02-12 ENCOUNTER — Other Ambulatory Visit: Payer: Self-pay

## 2023-03-06 ENCOUNTER — Other Ambulatory Visit: Payer: Self-pay | Admitting: Family Medicine

## 2023-03-06 ENCOUNTER — Telehealth: Payer: Self-pay

## 2023-03-06 ENCOUNTER — Other Ambulatory Visit: Payer: Self-pay

## 2023-03-06 DIAGNOSIS — E1149 Type 2 diabetes mellitus with other diabetic neurological complication: Secondary | ICD-10-CM

## 2023-03-06 DIAGNOSIS — E1169 Type 2 diabetes mellitus with other specified complication: Secondary | ICD-10-CM

## 2023-03-06 DIAGNOSIS — Z794 Long term (current) use of insulin: Secondary | ICD-10-CM

## 2023-03-06 NOTE — Telephone Encounter (Signed)
Pt has upcoming appointment 

## 2023-03-06 NOTE — Telephone Encounter (Addendum)
Patient came in requesting refills on Jardiance 10mg , DULoxetine 60mg , Rosuvastin 40 mg. Patient also requests prescription to be mailed to him if possible.

## 2023-03-07 ENCOUNTER — Other Ambulatory Visit: Payer: Self-pay

## 2023-03-08 ENCOUNTER — Other Ambulatory Visit: Payer: Self-pay

## 2023-03-08 ENCOUNTER — Ambulatory Visit: Payer: Medicare Other | Attending: Family Medicine | Admitting: Family Medicine

## 2023-03-08 ENCOUNTER — Other Ambulatory Visit (HOSPITAL_COMMUNITY): Payer: Self-pay

## 2023-03-08 VITALS — BP 108/73 | HR 85 | Ht 73.0 in | Wt 269.0 lb

## 2023-03-08 DIAGNOSIS — E785 Hyperlipidemia, unspecified: Secondary | ICD-10-CM

## 2023-03-08 DIAGNOSIS — Z7985 Long-term (current) use of injectable non-insulin antidiabetic drugs: Secondary | ICD-10-CM

## 2023-03-08 DIAGNOSIS — Z7984 Long term (current) use of oral hypoglycemic drugs: Secondary | ICD-10-CM | POA: Diagnosis not present

## 2023-03-08 DIAGNOSIS — R351 Nocturia: Secondary | ICD-10-CM

## 2023-03-08 DIAGNOSIS — Z794 Long term (current) use of insulin: Secondary | ICD-10-CM

## 2023-03-08 DIAGNOSIS — E1149 Type 2 diabetes mellitus with other diabetic neurological complication: Secondary | ICD-10-CM | POA: Diagnosis not present

## 2023-03-08 DIAGNOSIS — N401 Enlarged prostate with lower urinary tract symptoms: Secondary | ICD-10-CM

## 2023-03-08 DIAGNOSIS — E1169 Type 2 diabetes mellitus with other specified complication: Secondary | ICD-10-CM | POA: Diagnosis not present

## 2023-03-08 LAB — POCT GLYCOSYLATED HEMOGLOBIN (HGB A1C): HbA1c, POC (controlled diabetic range): 6.3 % (ref 0.0–7.0)

## 2023-03-08 MED ORDER — TAMSULOSIN HCL 0.4 MG PO CAPS
0.4000 mg | ORAL_CAPSULE | Freq: Every day | ORAL | 3 refills | Status: DC
  Filled 2023-03-08 (×3): qty 30, 30d supply, fill #0
  Filled 2023-04-09: qty 30, 30d supply, fill #1
  Filled 2023-04-09: qty 30, 30d supply, fill #0
  Filled 2023-05-14 – 2023-05-16 (×3): qty 30, 30d supply, fill #1
  Filled 2023-06-22: qty 30, 30d supply, fill #2

## 2023-03-08 MED ORDER — OZEMPIC (1 MG/DOSE) 4 MG/3ML ~~LOC~~ SOPN
1.0000 mg | PEN_INJECTOR | SUBCUTANEOUS | 6 refills | Status: DC
  Filled 2023-03-08 (×3): qty 3, 28d supply, fill #0
  Filled 2023-04-09: qty 3, 28d supply, fill #1
  Filled 2023-05-14: qty 3, 28d supply, fill #0
  Filled 2023-05-14 – 2023-07-02 (×2): qty 3, 28d supply, fill #1
  Filled 2023-08-07: qty 3, 28d supply, fill #2

## 2023-03-08 MED ORDER — ROSUVASTATIN CALCIUM 40 MG PO TABS
40.0000 mg | ORAL_TABLET | Freq: Every day | ORAL | 1 refills | Status: DC
Start: 2023-03-08 — End: 2023-08-16
  Filled 2023-03-08 (×3): qty 90, 90d supply, fill #0
  Filled 2023-06-22: qty 90, 90d supply, fill #1
  Filled 2023-06-28: qty 90, 90d supply, fill #0

## 2023-03-08 MED ORDER — EMPAGLIFLOZIN 10 MG PO TABS
10.0000 mg | ORAL_TABLET | Freq: Every day | ORAL | 1 refills | Status: DC
Start: 2023-03-08 — End: 2023-08-16
  Filled 2023-03-08 (×3): qty 90, 90d supply, fill #0
  Filled 2023-06-22: qty 90, 90d supply, fill #1
  Filled 2023-06-28: qty 90, 90d supply, fill #0

## 2023-03-08 MED ORDER — LANTUS SOLOSTAR 100 UNIT/ML ~~LOC~~ SOPN
36.0000 [IU] | PEN_INJECTOR | Freq: Every day | SUBCUTANEOUS | 6 refills | Status: DC
  Filled 2023-03-08 – 2023-05-16 (×4): qty 30, 83d supply, fill #0
  Filled 2023-08-31: qty 30, 83d supply, fill #1

## 2023-03-08 MED ORDER — DULOXETINE HCL 60 MG PO CPEP
60.0000 mg | ORAL_CAPSULE | Freq: Every day | ORAL | 1 refills | Status: DC
Start: 2023-03-08 — End: 2023-08-16
  Filled 2023-03-08 (×3): qty 90, 90d supply, fill #0
  Filled 2023-06-22: qty 90, 90d supply, fill #1
  Filled 2023-06-28: qty 90, 90d supply, fill #0

## 2023-03-08 NOTE — Patient Instructions (Signed)

## 2023-03-08 NOTE — Progress Notes (Signed)
Subjective:  Patient ID: Bill Torres, male    DOB: 1955/11/16  Age: 67 y.o. MRN: [id number]  CC: Medical Management of Chronic Issues (Check prostate/)   HPI Bill Torres is a 67 y.o. year old male with a history of obesity, type 2 diabetes mellitus (diagnosed in 11/2019), hyperlipidemia here for chronic disease management.    Interval History: Discussed the use of AI scribe software for clinical note transcription with the patient, who gave verbal consent to proceed.  He presents with urinary symptoms suggestive of benign prostatic hyperplasia (BPH). These symptoms have been ongoing for approximately three to four months. He has been self-medicating with over-the-counter supplements, which have not provided relief.  The patient also expresses frustration with the pharmacy, reporting difficulties in obtaining medication refills. He spent a significant amount of time waiting at the pharmacy, only to be told that I had not responded to refill requests even though I have not received any received any refill request from his pharmacy. This resulted in the patient missing his morning medication dose. Surprisingly he was seen 6 months ago and provided enough refills to last him for 6 months.     A1c 6.3 and he endorses adherence with his Jardiance, Ozempic, Lantus with no hypoglycemic episodes.  He had been adherent with his statin and duloxetine until he ran out 2 days ago.   Past Medical History:  Diagnosis Date   Cataract    Dental abscess 12/15/2019   Hyperlipidemia associated with type 2 diabetes mellitus (HCC) 12/14/2019    Past Surgical History:  Procedure Laterality Date   WISDOM TOOTH EXTRACTION      Family History  Problem Relation Age of Onset   Colon cancer Neg Hx    Esophageal cancer Neg Hx    Rectal cancer Neg Hx    Stomach cancer Neg Hx    Colon polyps Neg Hx     Social History   Socioeconomic History   Marital status: Single    Spouse name: Not  on file   Number of children: Not on file   Years of education: Not on file   Highest education level: Not on file  Occupational History   Not on file  Tobacco Use   Smoking status: Never   Smokeless tobacco: Never  Vaping Use   Vaping status: Never Used  Substance and Sexual Activity   Alcohol use: Never   Drug use: Never   Sexual activity: Not on file  Other Topics Concern   Not on file  Social History Narrative   Not on file   Social Determinants of Health   Financial Resource Strain: Low Risk  (06/05/2022)   Overall Financial Resource Strain (CARDIA)    Difficulty of Paying Living Expenses: Not hard at all  Food Insecurity: No Food Insecurity (06/05/2022)   Hunger Vital Sign    Worried About Running Out of Food in the Last Year: Never true    Ran Out of Food in the Last Year: Never true  Transportation Needs: No Transportation Needs (06/05/2022)   PRAPARE - Administrator, Civil Service (Medical): No    Lack of Transportation (Non-Medical): No  Physical Activity: Sufficiently Active (06/05/2022)   Exercise Vital Sign    Days of Exercise per Week: 7 days    Minutes of Exercise per Session: 30 min  Stress: No Stress Concern Present (06/05/2022)   [name]    Feeling of Stress :  Not at all  Social Connections: Moderately Integrated (06/05/2022)   Social Connection and Isolation Panel [NHANES]    Frequency of Communication with Friends and Family: Three times a week    Frequency of Social Gatherings with Friends and Family: Three times a week    Attends Religious Services: More than 4 times per year    Active Member of Clubs or Organizations: Yes    Attends Banker Meetings: Never    Marital Status: Divorced    No Known Allergies  Outpatient Medications Prior to Visit  Medication Sig Dispense Refill   Accu-Chek Softclix Lancets lancets Use to check blood sugar three times a day  100 each 2   Blood Glucose Monitoring Suppl (ACCU-CHEK GUIDE) w/Device KIT Use to check blood sugar three times a day 1 kit 0   glucose blood (ACCU-CHEK GUIDE) test strip USE TO TEST BLOOD SUGAR THREE TIMES A DAY 100 strip 2   Insulin Pen Needle (TECHLITE PEN NEEDLES) 32G X 4 MM MISC Use to inject Lantus and Ozempic.Must keep upcoming appointment for more refills. 100 each 2   DULoxetine (CYMBALTA) 60 MG capsule Take 1 capsule (60 mg total) by mouth daily. For neuropathy 90 capsule 1   empagliflozin (JARDIANCE) 10 MG TABS tablet Take 1 tablet (10 mg total) by mouth daily. 90 tablet 1   insulin glargine (LANTUS SOLOSTAR) 100 UNIT/ML Solostar Pen Inject 36 Units into the skin at bedtime. 30 mL 6   rosuvastatin (CRESTOR) 40 MG tablet Take 1 tablet (40 mg total) by mouth daily. 90 tablet 1   Semaglutide, 1 MG/DOSE, (OZEMPIC, 1 MG/DOSE,) 4 MG/3ML SOPN Inject 1 mg as directed once a week. 3 mL 6   No facility-administered medications prior to visit.     ROS Review of Systems  Constitutional:  Negative for activity change and appetite change.  HENT:  Negative for sinus pressure and sore throat.   Respiratory:  Negative for chest tightness, shortness of breath and wheezing.   Cardiovascular:  Negative for chest pain and palpitations.  Gastrointestinal:  Negative for abdominal distention, abdominal pain and constipation.  Genitourinary: Negative.   Musculoskeletal: Negative.   Psychiatric/Behavioral:  Negative for behavioral problems and dysphoric mood.     Objective:  BP 108/73   Pulse 85   Ht 6\' 1"  (1.854 m)   Wt 269 lb (122 kg)   SpO2 98%   BMI 35.49 kg/m      03/08/2023   10:08 AM 09/05/2022    8:49 AM 03/06/2022    8:44 AM  BP/Weight  Systolic BP 108 123 127  Diastolic BP 73 76 81  Wt. (Lbs) 269 293.8 294.8  BMI 35.49 kg/m2 38.76 kg/m2 38.89 kg/m2      Physical Exam Constitutional:      Appearance: He is well-developed.  Cardiovascular:     Rate and Rhythm: Normal  rate.     Heart sounds: Normal heart sounds. No murmur heard. Pulmonary:     Effort: Pulmonary effort is normal.     Breath sounds: Normal breath sounds. No wheezing or rales.  Chest:     Chest wall: No tenderness.  Abdominal:     General: Bowel sounds are normal. There is no distension.     Palpations: Abdomen is soft. There is no mass.     Tenderness: There is no abdominal tenderness.  Musculoskeletal:        General: Normal range of motion.     Right lower leg: No edema.  Left lower leg: No edema.  Neurological:     Mental Status: He is alert and oriented to person, place, and time.  Psychiatric:        Mood and Affect: Mood normal.        Latest Ref Rng & Units 09/08/2022    8:39 AM 03/08/2022    9:29 AM 08/29/2021   10:13 AM  CMP  Glucose 70 - 99 mg/dL 91  90  846   BUN 8 - 27 mg/dL 5  7  9    Creatinine 0.76 - 1.27 mg/dL 9.62  9.52  8.41   Sodium 134 - 144 mmol/L 141  141  143   Potassium 3.5 - 5.2 mmol/L 4.7  4.5  4.4   Chloride 96 - 106 mmol/L 102  102  105   CO2 20 - 29 mmol/L 23  26  23    Calcium 8.6 - 10.2 mg/dL 9.6  9.9  9.4   Total Protein 6.0 - 8.5 g/dL 7.4  7.7  7.2   Total Bilirubin 0.0 - 1.2 mg/dL 0.8  0.5  0.5   Alkaline Phos 44 - 121 IU/L 105  107  115   AST 0 - 40 IU/L 19  20  23    ALT 0 - 44 IU/L 26  31  34     Lipid Panel     Component Value Date/Time   CHOL 128 09/08/2022 0839   TRIG 127 09/08/2022 0839   HDL 35 (L) 09/08/2022 0839   CHOLHDL 7.8 (H) 06/16/2020 1130   CHOLHDL 9.2 12/13/2019 1647   VLDL 60 (H) 12/13/2019 1647   LDLCALC 70 09/08/2022 0839    CBC    Component Value Date/Time   WBC 6.7 09/08/2022 0839   WBC 10.6 (H) 09/13/2020 0754   RBC 5.22 09/08/2022 0839   RBC 5.11 09/13/2020 0754   HGB 15.6 09/08/2022 0839   HCT 45.9 09/08/2022 0839   PLT 248 09/08/2022 0839   MCV 88 09/08/2022 0839   MCH 29.9 09/08/2022 0839   MCH 30.5 09/13/2020 0754   MCHC 34.0 09/08/2022 0839   MCHC 34.0 09/13/2020 0754   RDW 13.3  09/08/2022 0839   LYMPHSABS 2.5 09/08/2022 0839   MONOABS 1.0 12/13/2019 1647   EOSABS 0.1 09/08/2022 0839   BASOSABS 0.0 09/08/2022 0839    Lab Results  Component Value Date   HGBA1C 6.3 03/08/2023    Assessment & Plan:      Benign Prostatic Hyperplasia New onset urinary frequency, nocturia, and dribbling over the past 3-4 months. Discussed the pathophysiology of BPH and the benefits of starting Flomax. -Start Flomax once daily. -Order prostate cancer screening.  Type 2 Diabetes Mellitus Well controlled with A1C of 6.3 on current regimen of Ozempic, Lantus, and Jardiance. -Continue current regimen. -Counseled on Diabetic diet, my plate method, 324 minutes of moderate intensity exercise/week Blood sugar logs with fasting goals of 80-120 mg/dl, random of less than 401 and in the event of sugars less than 60 mg/dl or greater than 027 mg/dl encouraged to notify the clinic. Advised on the need for annual eye exams, annual foot exams, Pneumonia vaccine.   Hyperlipidemia Well controlled on Crestor. -Continue Crestor.  Depression On Duloxetine (Cymbalta). -Continue Duloxetine.  Medication Refill Issue Patient reported issues with pharmacy refills. -Refill all current medications for 90 days with one refill. -Address issue with pharmacy.  Follow-up in 6 months or sooner if any issues arise.          Meds  ordered this encounter  Medications   DULoxetine (CYMBALTA) 60 MG capsule    Sig: Take 1 capsule (60 mg total) by mouth daily. For neuropathy    Dispense:  90 capsule    Refill:  1   empagliflozin (JARDIANCE) 10 MG TABS tablet    Sig: Take 1 tablet (10 mg total) by mouth daily.    Dispense:  90 tablet    Refill:  1   insulin glargine (LANTUS SOLOSTAR) 100 UNIT/ML Solostar Pen    Sig: Inject 36 Units into the skin at bedtime.    Dispense:  30 mL    Refill:  6    Dose decrease   rosuvastatin (CRESTOR) 40 MG tablet    Sig: Take 1 tablet (40 mg total) by mouth  daily.    Dispense:  90 tablet    Refill:  1   Semaglutide, 1 MG/DOSE, (OZEMPIC, 1 MG/DOSE,) 4 MG/3ML SOPN    Sig: Inject 1 mg as directed once a week.    Dispense:  3 mL    Refill:  6    Discontinue 0.5mg    tamsulosin (FLOMAX) 0.4 MG CAPS capsule    Sig: Take 1 capsule (0.4 mg total) by mouth daily.    Dispense:  30 capsule    Refill:  3    Follow-up: Return in about 6 months (around 09/05/2023) for Chronic medical conditions.       Hoy Register, MD, FAAFP. [facility] [place], [place] [phone]   03/08/2023, 2:40 PM

## 2023-03-09 ENCOUNTER — Other Ambulatory Visit: Payer: Self-pay | Admitting: Family Medicine

## 2023-03-09 DIAGNOSIS — R748 Abnormal levels of other serum enzymes: Secondary | ICD-10-CM

## 2023-03-09 LAB — CMP14+EGFR
ALT: 23 [IU]/L (ref 0–44)
AST: 21 [IU]/L (ref 0–40)
Albumin: 4.5 g/dL (ref 3.9–4.9)
Alkaline Phosphatase: 155 [IU]/L — ABNORMAL HIGH (ref 44–121)
BUN/Creatinine Ratio: 8 — ABNORMAL LOW (ref 10–24)
BUN: 7 mg/dL — ABNORMAL LOW (ref 8–27)
Bilirubin Total: 0.7 mg/dL (ref 0.0–1.2)
CO2: 24 mmol/L (ref 20–29)
Calcium: 9.9 mg/dL (ref 8.6–10.2)
Chloride: 101 mmol/L (ref 96–106)
Creatinine, Ser: 0.9 mg/dL (ref 0.76–1.27)
Globulin, Total: 3.3 g/dL (ref 1.5–4.5)
Glucose: 88 mg/dL (ref 70–99)
Potassium: 4.4 mmol/L (ref 3.5–5.2)
Sodium: 141 mmol/L (ref 134–144)
Total Protein: 7.8 g/dL (ref 6.0–8.5)
eGFR: 94 mL/min/{1.73_m2} (ref >59)

## 2023-03-09 LAB — PSA, TOTAL AND FREE
PSA, Free Pct: 16.3 %
PSA, Free: 0.26 ng/mL
Prostate Specific Ag, Serum: 1.6 ng/mL (ref 0.0–4.0)

## 2023-03-15 ENCOUNTER — Telehealth: Payer: Self-pay

## 2023-03-15 NOTE — Telephone Encounter (Signed)
Pt given lab results per notes of Dr. Alvis Lemmings on 03/09/23. Pt verbalized understanding. Patient stated he got a voicemail with the appointment date and time for the ultrasound but he can not make it due to having another appointment around the same time. Contact information for the office given and patient will call to reschedule the appointment.   Hello, Your prostate cancer screen is negative. Kidney function is normal but one of your liver enzymes is elevated. I have ordered a liver ultrasound to evaluate this and the Radiology department will contact you to schedule this. -Dr. Alvis Lemmings  Written by Hoy Register, MD on 03/09/2023  9:53 AM EST

## 2023-03-16 ENCOUNTER — Other Ambulatory Visit: Payer: Medicare Other

## 2023-03-19 ENCOUNTER — Ambulatory Visit
Admission: RE | Admit: 2023-03-19 | Discharge: 2023-03-19 | Disposition: A | Discharge status: Home / Self Care | Payer: Medicare Other | Source: Ambulatory Visit | Attending: Family Medicine | Admitting: Family Medicine

## 2023-03-19 DIAGNOSIS — R748 Abnormal levels of other serum enzymes: Secondary | ICD-10-CM

## 2023-03-19 DIAGNOSIS — K8020 Calculus of gallbladder without cholecystitis without obstruction: Secondary | ICD-10-CM | POA: Diagnosis not present

## 2023-03-19 DIAGNOSIS — K760 Fatty (change of) liver, not elsewhere classified: Secondary | ICD-10-CM | POA: Diagnosis not present

## 2023-04-09 ENCOUNTER — Other Ambulatory Visit: Payer: Self-pay

## 2023-04-09 ENCOUNTER — Other Ambulatory Visit (HOSPITAL_COMMUNITY): Payer: Self-pay

## 2023-04-12 ENCOUNTER — Other Ambulatory Visit: Payer: Self-pay

## 2023-04-23 ENCOUNTER — Other Ambulatory Visit: Payer: Self-pay

## 2023-05-14 ENCOUNTER — Other Ambulatory Visit: Payer: Self-pay

## 2023-05-14 ENCOUNTER — Telehealth: Payer: Self-pay | Admitting: Family Medicine

## 2023-05-14 ENCOUNTER — Other Ambulatory Visit (HOSPITAL_COMMUNITY): Payer: Self-pay

## 2023-05-14 MED ORDER — SILDENAFIL CITRATE 50 MG PO TABS
50.0000 mg | ORAL_TABLET | Freq: Every day | ORAL | 0 refills | Status: DC | PRN
  Filled 2023-05-14 – 2023-05-16 (×3): qty 10, 10d supply, fill #0
  Filled 2023-05-16: qty 10, 30d supply, fill #0

## 2023-05-14 NOTE — Telephone Encounter (Signed)
Copied from CRM [id number]. Topic: General - Inquiry >> May 14, 2023  8:57 AM Haroldine Laws wrote: Reason for CRM: pt would like Dr. Alvis Lemmings to call him.   He would not state why.  He said it was personal.  [phone]

## 2023-05-14 NOTE — Addendum Note (Signed)
Addended by: Hoy Register on: 05/14/2023 04:42 PM   Modules accepted: Orders

## 2023-05-14 NOTE — Telephone Encounter (Signed)
I have sent it to the Pharmacy.

## 2023-05-14 NOTE — Telephone Encounter (Signed)
Pt is requesting medication for erectile dysfunction  

## 2023-05-15 ENCOUNTER — Encounter: Payer: Self-pay | Admitting: Pharmacist

## 2023-05-15 ENCOUNTER — Other Ambulatory Visit: Payer: Self-pay

## 2023-05-15 NOTE — Telephone Encounter (Signed)
VM was left informing pt that medication has been sent to pharmacy.

## 2023-05-16 ENCOUNTER — Other Ambulatory Visit (HOSPITAL_COMMUNITY): Payer: Self-pay

## 2023-05-16 ENCOUNTER — Other Ambulatory Visit: Payer: Self-pay

## 2023-05-24 ENCOUNTER — Other Ambulatory Visit (HOSPITAL_COMMUNITY): Payer: Self-pay

## 2023-05-29 ENCOUNTER — Other Ambulatory Visit (HOSPITAL_COMMUNITY): Payer: Self-pay

## 2023-06-01 NOTE — Telephone Encounter (Unsigned)
 Copied from CRM [id number]. Topic: Clinical - Medical Advice >> Jun 01, 2023  9:20 AM Elle L wrote: Reason for CRM: The patient is requesting a call back from Dr. Newlin at [phone]. He states it is personal and did not feel comfortable sharing the reason for the call.

## 2023-06-01 NOTE — Telephone Encounter (Signed)
 Routing to CMA .    Maybe  this patient will speak with you so you can relay to Dr. newlin

## 2023-06-23 ENCOUNTER — Other Ambulatory Visit (HOSPITAL_COMMUNITY): Payer: Self-pay

## 2023-06-25 ENCOUNTER — Other Ambulatory Visit: Payer: Self-pay

## 2023-06-28 ENCOUNTER — Other Ambulatory Visit (HOSPITAL_COMMUNITY): Payer: Self-pay

## 2023-06-28 ENCOUNTER — Other Ambulatory Visit: Payer: Self-pay

## 2023-07-02 ENCOUNTER — Other Ambulatory Visit (HOSPITAL_COMMUNITY): Payer: Self-pay

## 2023-07-12 ENCOUNTER — Other Ambulatory Visit: Payer: Self-pay

## 2023-07-12 ENCOUNTER — Other Ambulatory Visit (HOSPITAL_COMMUNITY): Payer: Self-pay

## 2023-07-12 ENCOUNTER — Emergency Department (HOSPITAL_COMMUNITY)
Admission: EM | Admit: 2023-07-12 | Discharge: 2023-07-12 | Disposition: A | Discharge status: Home / Self Care | Attending: Emergency Medicine | Admitting: Emergency Medicine

## 2023-07-12 ENCOUNTER — Encounter (HOSPITAL_COMMUNITY): Payer: Self-pay | Admitting: Student

## 2023-07-12 DIAGNOSIS — Z7984 Long term (current) use of oral hypoglycemic drugs: Secondary | ICD-10-CM | POA: Insufficient documentation

## 2023-07-12 DIAGNOSIS — Z794 Long term (current) use of insulin: Secondary | ICD-10-CM | POA: Insufficient documentation

## 2023-07-12 DIAGNOSIS — H9203 Otalgia, bilateral: Secondary | ICD-10-CM | POA: Diagnosis present

## 2023-07-12 DIAGNOSIS — R000 Tachycardia, unspecified: Secondary | ICD-10-CM | POA: Insufficient documentation

## 2023-07-12 DIAGNOSIS — H6693 Otitis media, unspecified, bilateral: Secondary | ICD-10-CM

## 2023-07-12 DIAGNOSIS — E119 Type 2 diabetes mellitus without complications: Secondary | ICD-10-CM | POA: Insufficient documentation

## 2023-07-12 LAB — COMPREHENSIVE METABOLIC PANEL
ALT: 30 U/L (ref 0–44)
AST: 28 U/L (ref 15–41)
Albumin: 3.7 g/dL (ref 3.5–5.0)
Alkaline Phosphatase: 135 U/L — ABNORMAL HIGH (ref 38–126)
Anion gap: 11 (ref 5–15)
BUN: 7 mg/dL — ABNORMAL LOW (ref 8–23)
CO2: 25 mmol/L (ref 22–32)
Calcium: 9.3 mg/dL (ref 8.9–10.3)
Chloride: 102 mmol/L (ref 98–111)
Creatinine, Ser: 0.85 mg/dL (ref 0.61–1.24)
GFR, Estimated: >60 mL/min (ref >60)
Glucose, Bld: 122 mg/dL — ABNORMAL HIGH (ref 70–99)
Potassium: 3.6 mmol/L (ref 3.5–5.1)
Sodium: 138 mmol/L (ref 135–145)
Total Bilirubin: 1 mg/dL (ref 0.0–1.2)
Total Protein: 8.6 g/dL — ABNORMAL HIGH (ref 6.5–8.1)

## 2023-07-12 LAB — CBC WITH DIFFERENTIAL/PLATELET
Abs Immature Granulocytes: 0.05 10*3/uL (ref 0.00–0.07)
Basophils Absolute: 0 10*3/uL (ref 0.0–0.1)
Basophils Relative: 0 %
Eosinophils Absolute: 0.4 10*3/uL (ref 0.0–0.5)
Eosinophils Relative: 3 %
HCT: 46.2 % (ref 39.0–52.0)
Hemoglobin: 15.5 g/dL (ref 13.0–17.0)
Immature Granulocytes: 0 %
Lymphocytes Relative: 15 %
Lymphs Abs: 2 10*3/uL (ref 0.7–4.0)
MCH: 30 pg (ref 26.0–34.0)
MCHC: 33.5 g/dL (ref 30.0–36.0)
MCV: 89.5 fL (ref 80.0–100.0)
Monocytes Absolute: 1.1 10*3/uL — ABNORMAL HIGH (ref 0.1–1.0)
Monocytes Relative: 8 %
Neutro Abs: 10.4 10*3/uL — ABNORMAL HIGH (ref 1.7–7.7)
Neutrophils Relative %: 74 %
Platelets: 321 10*3/uL (ref 150–400)
RBC: 5.16 MIL/uL (ref 4.22–5.81)
RDW: 13.2 % (ref 11.5–15.5)
WBC: 13.9 10*3/uL — ABNORMAL HIGH (ref 4.0–10.5)
nRBC: 0 % (ref 0.0–0.2)

## 2023-07-12 LAB — I-STAT CG4 LACTIC ACID, ED: Lactic Acid, Venous: 1.2 mmol/L (ref 0.5–1.9)

## 2023-07-12 MED ORDER — AMOXICILLIN-POT CLAVULANATE ER 1000-62.5 MG PO TB12: 2.0000 | ORAL_TABLET | Freq: Two times a day (BID) | ORAL | Status: DC

## 2023-07-12 MED ORDER — ACETAMINOPHEN 325 MG PO TABS
650.0000 mg | ORAL_TABLET | Freq: Once | ORAL | Status: AC
Start: 2023-07-12 — End: 2023-07-12
  Administered 2023-07-12: 650 mg via ORAL
  Filled 2023-07-12: qty 2

## 2023-07-12 MED ORDER — AMOXICILLIN-POT CLAVULANATE ER 1000-62.5 MG PO TB12
2.0000 | ORAL_TABLET | Freq: Two times a day (BID) | ORAL | 0 refills | Status: AC
Start: 2023-07-12 — End: 2023-07-27
  Filled 2023-07-12 (×2): qty 40, 10d supply, fill #0

## 2023-07-12 MED ORDER — AMOXICILLIN-POT CLAVULANATE ER 1000-62.5 MG PO TB12
2.0000 | ORAL_TABLET | Freq: Once | ORAL | Status: AC
  Administered 2023-07-12: 2 via ORAL
  Filled 2023-07-12: qty 2

## 2023-07-12 NOTE — Discharge Instructions (Addendum)
 You were seen in the ER today and found to have an ear infection in both of your ears. Your labs showed your white blood cell count was mildly elevated which we suspect is due to the infection.   We are starting you on Augmentin, this is an antibiotic to treat the infection.   Please take all of your antibiotics until finished. You may develop abdominal discomfort or diarrhea from the antibiotic.  You may help offset this with probiotics which you can buy at the store (ask your pharmacist if unable to find) or get probiotics in the form of eating yogurt. Do not eat or take the probiotics until 2 hours after your antibiotic. If you are unable to tolerate these side effects follow-up with your primary care provider or return to the emergency department.   If you begin to experience any blistering, rashes, swelling, or difficulty breathing seek medical care for evaluation of potentially more serious side effects.   Please be aware that this medication may interact with other medications you are taking, please be sure to discuss your medication list with your pharmacist.   Please take tylenol as needed for pain, you can also utilize flonase to help with your nasal congestion.   Follow up with primary care within 2 weeks for reevaluation of your symptoms Return to the ED for new or worsening symptoms including but not limited to new or worsening pain, difficulty moving your neck/touching your chin to your chest, pain/swelling/redness right behind the ear, fevers, trouble breathing or any other concerns.

## 2023-07-12 NOTE — ED Provider Notes (Signed)
 [place] EMERGENCY DEPARTMENT AT [facility] Provider Note   CSN: [id number] Arrival date & time: 07/12/23  6962     History  Chief Complaint  Patient presents with   Otalgia    Bill Torres is a 68 y.o. male with a hx of T2DM w/ neuropathy and hyperlipidemia who presents to the ED with complaints of ear pain x 3-4 days. Patient reports pain to his ears bilaterally, R>L, with an associated fullness and muffled hearing. He is also experiencing nasal congestion, rhinorrhea, headaches (gradual onset), and occasional dry cough. Took aleve with some mild relief, but not resolution. Denies fever, chills, dyspnea, chest pain, abdominal pain, N/V, change in vision, or syncope. Denies ear drainage despite triage note- states drainage is from his nose.    HPI     Home Medications Prior to Admission medications   Medication Sig Start Date End Date Taking? Authorizing Provider  Accu-Chek Softclix Lancets lancets Use to check blood sugar three times a day 05/12/22   Hoy Register, MD  Blood Glucose Monitoring Suppl (ACCU-CHEK GUIDE) w/Device KIT Use to check blood sugar three times a day 11/18/21   Hoy Register, MD  DULoxetine (CYMBALTA) 60 MG capsule Take 1 capsule (60 mg total) by mouth daily. For neuropathy 03/08/23   Hoy Register, MD  empagliflozin (JARDIANCE) 10 MG TABS tablet Take 1 tablet (10 mg total) by mouth daily. 03/08/23   Hoy Register, MD  glucose blood (ACCU-CHEK GUIDE) test strip USE TO TEST BLOOD SUGAR THREE TIMES A DAY 05/17/22   Hoy Register, MD  insulin glargine (LANTUS SOLOSTAR) 100 UNIT/ML Solostar Pen Inject 36 Units into the skin at bedtime. 03/08/23   Hoy Register, MD  Insulin Pen Needle (TECHLITE PEN NEEDLES) 32G X 4 MM MISC Use to inject Lantus and Ozempic.Must keep upcoming appointment for more refills. 12/05/22   Hoy Register, MD  rosuvastatin (CRESTOR) 40 MG tablet Take 1 tablet (40 mg total) by mouth daily. 03/08/23   Hoy Register, MD  Semaglutide, 1 MG/DOSE, (OZEMPIC, 1 MG/DOSE,) 4 MG/3ML SOPN Inject 1 mg as directed once a week. 03/08/23   Hoy Register, MD  sildenafil (VIAGRA) 50 MG tablet Take 1 tablet (50 mg total) by mouth daily as needed for erectile dysfunction. 05/14/23   Hoy Register, MD  tamsulosin (FLOMAX) 0.4 MG CAPS capsule Take 1 capsule (0.4 mg total) by mouth daily. 03/08/23   Hoy Register, MD      Allergies    Patient has no known allergies.    Review of Systems   Review of Systems  Constitutional:  Negative for chills and fever.  HENT:  Positive for congestion, ear pain and rhinorrhea. Negative for ear discharge, sore throat and trouble swallowing.   Eyes:  Negative for visual disturbance.  Respiratory:  Positive for cough. Negative for shortness of breath.   Cardiovascular:  Negative for chest pain.  Gastrointestinal:  Negative for abdominal pain, nausea and vomiting.  Genitourinary:  Negative for dysuria.  Neurological:  Positive for headaches. Negative for dizziness and syncope.  All other systems reviewed and are negative.   Physical Exam Updated Vital Signs BP (!) 134/93 (BP Location: Right Arm)   Pulse (!) 125   Temp 98.1 F (36.7 C)   Resp 16   Ht 6\' 1"  (1.854 m)   Wt 129.3 kg   SpO2 100%   BMI 37.60 kg/m  Physical Exam Vitals and nursing note reviewed.  Constitutional:      General: He is not  in acute distress.    Appearance: He is well-developed. He is not toxic-appearing.  HENT:     Head: Normocephalic and atraumatic.     Right Ear: Tympanic membrane is erythematous and bulging. Tympanic membrane is not perforated.     Left Ear: Tympanic membrane is erythematous and bulging. Tympanic membrane is not perforated.     Ears:     Comments: No mastoid erythema/swellng/tenderness.     Nose: Mucosal edema and congestion present.     Right Sinus: Frontal sinus tenderness present. No maxillary sinus tenderness.     Left Sinus: Frontal sinus tenderness present. No  maxillary sinus tenderness.     Mouth/Throat:     Pharynx: Oropharynx is clear. Uvula midline. No oropharyngeal exudate or posterior oropharyngeal erythema.     Comments: Posterior oropharynx is symmetric appearing. Patient tolerating own secretions without difficulty. No trismus. No drooling. No hot potato voice. No swelling beneath the tongue, submandibular compartment is soft.  Eyes:     General:        Right eye: No discharge.        Left eye: No discharge.     Extraocular Movements: Extraocular movements intact.     Conjunctiva/sclera: Conjunctivae normal.     Pupils: Pupils are equal, round, and reactive to light.  Cardiovascular:     Rate and Rhythm: Regular rhythm. Tachycardia present.  Pulmonary:     Effort: Pulmonary effort is normal. No respiratory distress.     Breath sounds: Normal breath sounds. No wheezing, rhonchi or rales.  Abdominal:     General: There is no distension.     Palpations: Abdomen is soft.     Tenderness: There is no abdominal tenderness.  Musculoskeletal:     Cervical back: Normal range of motion and neck supple. No rigidity or crepitus.  Lymphadenopathy:     Cervical: Cervical adenopathy (more prominent R sided LN without overlying erythema/warmth/fluctuance, mobile) present.  Skin:    General: Skin is warm and dry.     Findings: No rash.  Neurological:     Mental Status: He is alert.     Comments: No focal neuro deficits.   Psychiatric:        Behavior: Behavior normal.     ED Results / Procedures / Treatments   Labs (all labs ordered are listed, but only abnormal results are displayed) Labs Reviewed  COMPREHENSIVE METABOLIC PANEL - Abnormal; Notable for the following components:      Result Value   Glucose, Bld 122 (*)    BUN 7 (*)    Total Protein 8.6 (*)    Alkaline Phosphatase 135 (*)    All other components within normal limits  CBC WITH DIFFERENTIAL/PLATELET - Abnormal; Notable for the following components:   WBC 13.9 (*)     Neutro Abs 10.4 (*)    Monocytes Absolute 1.1 (*)    All other components within normal limits  I-STAT CG4 LACTIC ACID, ED    EKG None  Radiology No results found.  Procedures Procedures    Medications Ordered in ED Medications - No data to display  ED Course/ Medical Decision Making/ A&P                                 Medical Decision Making Amount and/or Complexity of Data Reviewed Labs: ordered.  Risk OTC drugs. Prescription drug management.   Patient presents to the ED with complaints of  ear pain for the past few days. Nontoxic, vitals w/ tachycardia to 125 on arrival.   Additional history obtained:  Chart/nursing notes reviewed.   Lab Tests:  I viewed & interpreted labs including:  CBC: Mild leukocytosis @ 13,900  CMP: Glucose 122, no critical electrolyte derangmenet Lactic acid: Normal    ED Course:  HR improved to 108bpm on initial evaluation.  I ordered medications including tylenol for pain and augment for infxn tx.  Patient is nontoxic appearing, in no apparent distress Exam clinically consistent with bilateral AOM.  No findings to suggest otitis externa/malignant otitis externa.  Exam w/o mastoid erythema/swelling/tenderness, do not suspect mastoiditis Well appearing, no nuchal rigidity, do not suspect meningitis Does have lymphadenopathy, no overlying skin changes or fluctuance, airway is patent. .   Will tx w/ Augmentin (high dose per up to date) continue OTC analgesic use, discussed flonase. PCP follow up for recheck of sxs and to ensure improved lymphadenopathy. I discussed results, treatment plan, need for follow-up, and return precautions with the patient. Provided opportunity for questions, patient confirmed understanding and is in agreement with plan.   This is a shared visit with supervising physician Dr. Criss Alvine who has independently evaluated patient & provided guidance in evaluation/management/disposition, in agreement with care    Portions of this note were generated with Dragon dictation software. Dictation errors may occur despite best attempts at proofreading.   Vitals:   07/12/23 0820 07/12/23 1049  BP: (!) 134/93 (!) 127/91  Pulse: (!) 125 (!) 103  Resp: 16 17  Temp: 98.1 F (36.7 C) 98.2 F (36.8 C)  SpO2: 100% 97%    Final Clinical Impression(s) / ED Diagnoses Final diagnoses:  Bilateral acute otitis media    Rx / DC Orders ED Discharge Orders          Ordered    amoxicillin-clavulanate (AUGMENTIN XR) 1000-62.5 MG 12 hr tablet  2 times daily        07/12/23 [address], [place], PA-C 07/12/23 1135    Pricilla Loveless, MD 07/12/23 1514

## 2023-07-12 NOTE — ED Triage Notes (Signed)
 Pt came in via POV d/t the last 3 days having drainage in bil ears. Says it hurts to blow his nose & cannot sleep at night. Endorses the drainage is green & he can "barley" hear. A/Ox4, rates his pain 6/10 during triage.

## 2023-07-13 ENCOUNTER — Other Ambulatory Visit: Payer: Self-pay

## 2023-07-16 ENCOUNTER — Other Ambulatory Visit: Payer: Self-pay

## 2023-07-16 ENCOUNTER — Other Ambulatory Visit: Payer: Self-pay | Admitting: Family Medicine

## 2023-07-16 DIAGNOSIS — Z794 Long term (current) use of insulin: Secondary | ICD-10-CM

## 2023-07-16 DIAGNOSIS — E1169 Type 2 diabetes mellitus with other specified complication: Secondary | ICD-10-CM

## 2023-07-17 ENCOUNTER — Other Ambulatory Visit: Payer: Self-pay

## 2023-07-17 ENCOUNTER — Other Ambulatory Visit (HOSPITAL_COMMUNITY): Payer: Self-pay

## 2023-07-17 MED ORDER — GLUCOSE BLOOD VI STRP
ORAL_STRIP | 2 refills | Status: AC
  Filled 2023-07-17: qty 100, 30d supply, fill #0

## 2023-07-17 NOTE — Telephone Encounter (Signed)
 Requested medication (s) are due for refill today: yes  Requested medication (s) are on the active medication list: yes  Last refill:  05/17/22 100 strips 2 RF  Future visit scheduled: yes  Notes to clinic:  new brand order needed- asking for brand other than Accucheck   Requested Prescriptions  Pending Prescriptions Disp Refills   glucose blood test strip 100 strip 2    Sig: USE TO TEST BLOOD SUGAR THREE TIMES A DAY     Endocrinology: Diabetes - Testing Supplies Passed - 07/17/2023  1:29 PM      Passed - Valid encounter within last 12 months    Recent Outpatient Visits           4 months ago Type 2 diabetes mellitus with other specified complication, with long-term current use of insulin (HCC)   [facility] [place] Hoy Register, MD   10 months ago Facial pain   [facility] [place]. [facility] Hoy Register, MD   1 year ago Type 2 diabetes mellitus with other specified complication, with long-term current use of insulin (HCC)   [facility] [place]. [facility] Hoy Register, MD   1 year ago Type 2 diabetes mellitus with other specified complication, with long-term current use of insulin (HCC)   [facility] [place]. [facility] Hoy Register, MD   2 years ago Type 2 diabetes mellitus with other specified complication, with long-term current use of insulin (HCC)   [facility] [place]. [facility] Hoy Register, MD       Future Appointments             In 1 month Hoy Register, MD [facility] [place] [facility] Of [place]. [facility]

## 2023-07-26 ENCOUNTER — Other Ambulatory Visit: Payer: Self-pay

## 2023-08-05 ENCOUNTER — Other Ambulatory Visit: Payer: Self-pay | Admitting: Family Medicine

## 2023-08-06 ENCOUNTER — Other Ambulatory Visit (HOSPITAL_COMMUNITY): Payer: Self-pay

## 2023-08-06 ENCOUNTER — Other Ambulatory Visit (HOSPITAL_BASED_OUTPATIENT_CLINIC_OR_DEPARTMENT_OTHER): Payer: Self-pay

## 2023-08-06 MED ORDER — TAMSULOSIN HCL 0.4 MG PO CAPS
0.4000 mg | ORAL_CAPSULE | Freq: Every day | ORAL | 0 refills | Status: DC
  Filled 2023-08-06: qty 30, 30d supply, fill #0

## 2023-08-07 ENCOUNTER — Other Ambulatory Visit (HOSPITAL_COMMUNITY): Payer: Self-pay

## 2023-08-16 ENCOUNTER — Encounter: Payer: Self-pay | Admitting: Physician Assistant

## 2023-08-16 ENCOUNTER — Other Ambulatory Visit: Payer: Self-pay

## 2023-08-16 ENCOUNTER — Ambulatory Visit: Attending: Physician Assistant | Admitting: Physician Assistant

## 2023-08-16 VITALS — BP 105/75 | HR 86 | Resp 16 | Wt 262.0 lb

## 2023-08-16 DIAGNOSIS — E1165 Type 2 diabetes mellitus with hyperglycemia: Secondary | ICD-10-CM

## 2023-08-16 DIAGNOSIS — N528 Other male erectile dysfunction: Secondary | ICD-10-CM

## 2023-08-16 DIAGNOSIS — E1169 Type 2 diabetes mellitus with other specified complication: Secondary | ICD-10-CM | POA: Diagnosis not present

## 2023-08-16 DIAGNOSIS — H6991 Unspecified Eustachian tube disorder, right ear: Secondary | ICD-10-CM

## 2023-08-16 DIAGNOSIS — E785 Hyperlipidemia, unspecified: Secondary | ICD-10-CM

## 2023-08-16 DIAGNOSIS — E1149 Type 2 diabetes mellitus with other diabetic neurological complication: Secondary | ICD-10-CM

## 2023-08-16 DIAGNOSIS — Z794 Long term (current) use of insulin: Secondary | ICD-10-CM

## 2023-08-16 LAB — POCT GLYCOSYLATED HEMOGLOBIN (HGB A1C): HbA1c, POC (controlled diabetic range): 6 % (ref 0.0–7.0)

## 2023-08-16 LAB — GLUCOSE, POCT (MANUAL RESULT ENTRY): POC Glucose: 102 mg/dL — AB (ref 70–99)

## 2023-08-16 MED ORDER — SILDENAFIL CITRATE 100 MG PO TABS
100.0000 mg | ORAL_TABLET | Freq: Every day | ORAL | 2 refills | Status: DC | PRN
  Filled 2023-08-16: qty 10, 10d supply, fill #0
  Filled 2023-08-25 – 2023-08-31 (×2): qty 10, 10d supply, fill #1

## 2023-08-16 MED ORDER — FLUTICASONE PROPIONATE 50 MCG/ACT NA SUSP
2.0000 | Freq: Every day | NASAL | 6 refills | Status: DC
Start: 2023-08-16 — End: 2023-09-05
  Filled 2023-08-16: qty 16, fill #0
  Filled 2023-08-16: qty 16, 30d supply, fill #0

## 2023-08-16 MED ORDER — EMPAGLIFLOZIN 10 MG PO TABS
10.0000 mg | ORAL_TABLET | Freq: Every day | ORAL | 1 refills | Status: DC
  Filled 2023-08-16: qty 90, 90d supply, fill #0

## 2023-08-16 MED ORDER — TAMSULOSIN HCL 0.4 MG PO CAPS
0.4000 mg | ORAL_CAPSULE | Freq: Every day | ORAL | 0 refills | Status: DC
  Filled 2023-08-16: qty 30, 30d supply, fill #0

## 2023-08-16 MED ORDER — ROSUVASTATIN CALCIUM 40 MG PO TABS
40.0000 mg | ORAL_TABLET | Freq: Every day | ORAL | 1 refills | Status: DC
Start: 2023-08-16 — End: 2023-09-05
  Filled 2023-08-16: qty 90, 90d supply, fill #0

## 2023-08-16 MED ORDER — DULOXETINE HCL 60 MG PO CPEP
60.0000 mg | ORAL_CAPSULE | Freq: Every day | ORAL | 1 refills | Status: DC
  Filled 2023-08-16: qty 90, 90d supply, fill #0

## 2023-08-16 MED ORDER — PREDNISONE 10 MG PO TABS
ORAL_TABLET | ORAL | 0 refills | Status: AC
Start: 2023-08-16 — End: 2023-08-22
  Filled 2023-08-16: qty 21, 6d supply, fill #0

## 2023-08-16 NOTE — Patient Instructions (Addendum)
 Phenylephrine 10mg  every 4 hours while awake   Eustachian Tube Dysfunction  Eustachian tube dysfunction refers to a condition in which a blockage develops in the narrow passage that connects the middle ear to the back of the nose (eustachian tube). The eustachian tube regulates air pressure in the middle ear by letting air move between the ear and nose. It also helps to drain fluid from the middle ear space. Eustachian tube dysfunction can affect one or both ears. When the eustachian tube does not function properly, air pressure, fluid, or both can build up in the middle ear. What are the causes? This condition occurs when the eustachian tube becomes blocked or cannot open normally. Common causes of this condition include: Ear infections. Colds and other infections that affect the nose, mouth, and throat (upper respiratory tract). Allergies. Irritation from cigarette smoke. Irritation from stomach acid coming up into the esophagus (gastroesophageal reflux). The esophagus is the part of the body that moves food from the mouth to the stomach. Sudden changes in air pressure, such as from descending in an airplane or scuba diving. Abnormal growths in the nose or throat, such as: Growths that line the nose (nasal polyps). Abnormal growth of cells (tumors). Enlarged tissue at the back of the throat (adenoids). What increases the risk? You are more likely to develop this condition if: You smoke. You are overweight. You are a child who has: Certain birth defects of the mouth, such as cleft palate. Large tonsils or adenoids. What are the signs or symptoms? Common symptoms of this condition include: A feeling of fullness in the ear. Ear pain. Clicking or popping noises in the ear. Ringing in the ear (tinnitus). Hearing loss. Loss of balance. Dizziness. Symptoms may get worse when the air pressure around you changes, such as when you travel to an area of high elevation, fly on an airplane, or  go scuba diving. How is this diagnosed? This condition may be diagnosed based on: Your symptoms. A physical exam of your ears, nose, and throat. Tests, such as those that measure: The movement of your eardrum. Your hearing (audiometry). How is this treated? Treatment depends on the cause and severity of your condition. In mild cases, you may relieve your symptoms by moving air into your ears. This is called "popping the ears." In more severe cases, or if you have symptoms of fluid in your ears, treatment may include: Medicines to relieve congestion (decongestants). Medicines that treat allergies (antihistamines). Nasal sprays or ear drops that contain medicines that reduce swelling (steroids). A procedure to drain the fluid in your eardrum. In this procedure, a small tube may be placed in the eardrum to: Drain the fluid. Restore the air in the middle ear space. A procedure to insert a balloon device through the nose to inflate the opening of the eustachian tube (balloon dilation). Follow these instructions at home: Lifestyle Do not do any of the following until your health care provider approves: Travel to high altitudes. Fly in airplanes. Work in a Estate agent or room. Scuba dive. Do not use any products that contain nicotine or tobacco. These products include cigarettes, chewing tobacco, and vaping devices, such as e-cigarettes. If you need help quitting, ask your health care provider. Keep your ears dry. Wear fitted earplugs during showering and bathing. Dry your ears completely after. General instructions Take over-the-counter and prescription medicines only as told by your health care provider. Use techniques to help pop your ears as recommended by your health care provider.  These may include: Chewing gum. Yawning. Frequent, forceful swallowing. Closing your mouth, holding your nose closed, and gently blowing as if you are trying to blow air out of your nose. Keep all  follow-up visits. This is important. Contact a health care provider if: Your symptoms do not go away after treatment. Your symptoms come back after treatment. You are unable to pop your ears. You have: A fever. Pain in your ear. Pain in your head or neck. Fluid draining from your ear. Your hearing suddenly changes. You become very dizzy. You lose your balance. Get help right away if: You have a sudden, severe increase in any of your symptoms. Summary Eustachian tube dysfunction refers to a condition in which a blockage develops in the eustachian tube. It can be caused by ear infections, allergies, inhaled irritants, or abnormal growths in the nose or throat. Symptoms may include ear pain or fullness, hearing loss, or ringing in the ears. Mild cases are treated with techniques to unblock the ears, such as yawning or chewing gum. More severe cases are treated with medicines or procedures. This information is not intended to replace advice given to you by your health care provider. Make sure you discuss any questions you have with your health care provider. Document Revised: 06/21/2020 Document Reviewed: 06/21/2020 Elsevier Patient Education  2024 [employer].

## 2023-08-16 NOTE — Progress Notes (Signed)
 Needs refill on viagra  however it's not working on 50 mg. Would like to discuss.   Check ear, feels like they are still clogged from ear infection in March, 2025

## 2023-08-16 NOTE — Progress Notes (Signed)
 Patient ID: Bill Torres, male   DOB: 1956/04/04, 68 y.o.   MRN: [id number]   Bill Torres, is a 68 y.o. male  [id number]  [id number]  DOB - 05-21-1955  Chief Complaint  Patient presents with   Erectile Dysfunction       Subjective:   Bill Torres is a 68 y.o. male here today for recheck ears since ear infection about 6 weeks ago.  R ear feels congested >L.  Feels like decreased hearing.  No pain now.  Also concerned bc viagra  50mg  not working.  He tried it 3 days in a row and did not get any erection. He denies any light-headedness, dizziness, weakness or CP when he took it.  No HA.     No problems updated.  ALLERGIES: No Known Allergies  PAST MEDICAL HISTORY: Past Medical History:  Diagnosis Date   Cataract    Dental abscess 12/15/2019   Hyperlipidemia associated with type 2 diabetes mellitus (HCC) 12/14/2019    MEDICATIONS AT HOME: Prior to Admission medications   Medication Sig Start Date End Date Taking? Authorizing Provider  Accu-Chek Softclix Lancets lancets Use to check blood sugar three times a day 05/12/22  Yes Newlin, Lavelle Posey, MD  Blood Glucose Monitoring Suppl (ACCU-CHEK GUIDE) w/Device KIT Use to check blood sugar three times a day 11/18/21  Yes Newlin, Enobong, MD  fluticasone  (FLONASE ) 50 MCG/ACT nasal spray Place 2 sprays into both nostrils daily. 08/16/23  Yes Dulce Gibbs M, PA-C  glucose blood test strip USE TO TEST BLOOD SUGAR THREE TIMES A DAY 07/17/23  Yes Newlin, Enobong, MD  insulin  glargine (LANTUS  SOLOSTAR) 100 UNIT/ML Solostar Pen Inject 36 Units into the skin at bedtime. 03/08/23  Yes Newlin, Enobong, MD  Insulin  Pen Needle (TECHLITE PEN NEEDLES) 32G X 4 MM MISC Use to inject Lantus  and Ozempic .Must keep upcoming appointment for more refills. 12/05/22  Yes Newlin, Enobong, MD  predniSONE  (DELTASONE ) 10 MG tablet Take 6 tablets (60 mg total) by mouth daily for 1 day, THEN 5 tablets (50 mg total) daily for 1 day, THEN 4 tablets (40  mg total) daily for 1 day, THEN 3 tablets (30 mg total) daily for 1 day, THEN 2 tablets (20 mg total) daily for 1 day, THEN 1 tablet (10 mg total) daily for 1 day. 08/16/23 08/22/23 Yes Tiffancy Moger, Stan Eans, PA-C  Semaglutide , 1 MG/DOSE, (OZEMPIC , 1 MG/DOSE,) 4 MG/3ML SOPN Inject 1 mg as directed once a week. 03/08/23  Yes Newlin, Enobong, MD  sildenafil  (VIAGRA ) 100 MG tablet Take 1 tablet (100 mg total) by mouth daily as needed for erectile dysfunction. 08/16/23  Yes Dulce Gibbs M, PA-C  DULoxetine  (CYMBALTA ) 60 MG capsule Take 1 capsule (60 mg total) by mouth daily. For neuropathy 08/16/23   Hassie Lint, PA-C  empagliflozin  (JARDIANCE ) 10 MG TABS tablet Take 1 tablet (10 mg total) by mouth daily. 08/16/23   Hassie Lint, PA-C  rosuvastatin  (CRESTOR ) 40 MG tablet Take 1 tablet (40 mg total) by mouth daily. 08/16/23   Hassie Lint, PA-C  tamsulosin  (FLOMAX ) 0.4 MG CAPS capsule Take 1 capsule (0.4 mg total) by mouth daily. 08/16/23   Lidwina Kaner, Stan Eans, PA-C    ROS: Neg resp Neg cardiac Neg GI Neg GU Neg MS Neg psych Neg neuro  Objective:   Vitals:   08/16/23 1110  BP: 105/75  Pulse: 86  Resp: 16  SpO2: 99%  Weight: 262 lb (118.8 kg)   Exam General appearance : Awake, alert, not in  any distress. Speech Clear. Not toxic looking HEENT: Atraumatic and Normocephalic, pupils equally reactive to light and accomodation, L TM bulging slightly.  R TM bulging and slightly distorted light reflex with minimal erythema(appears congested) Neck: Supple, no JVD. No cervical lymphadenopathy.  Chest: Good air entry bilaterally, CTAB.  No rales/rhonchi/wheezing CVS: S1 S2 regular, no murmurs.  Extremities: B/L Lower Ext shows no edema, both legs are warm to touch Neurology: Awake alert, and oriented X 3, CN II-XII intact, Non focal Skin: No Rash  Data Review Lab Results  Component Value Date   HGBA1C 6.3 03/08/2023   HGBA1C 6.3 09/05/2022   HGBA1C 6.4 03/06/2022    Assessment &  Plan   1. Type 2 diabetes mellitus with hyperglycemia, unspecified whether long term insulin  use (HCC) (Primary) Controlled-continue current regimen - Glucose (CBG) - HgB A1c  2. Type 2 diabetes mellitus with other specified complication, with long-term current use of insulin  (HCC) controlled - empagliflozin  (JARDIANCE ) 10 MG TABS tablet; Take 1 tablet (10 mg total) by mouth daily.  Dispense: 90 tablet; Refill: 1  3. Other diabetic neurological complication associated with type 2 diabetes mellitus (HCC) - DULoxetine  (CYMBALTA ) 60 MG capsule; Take 1 capsule (60 mg total) by mouth daily. For neuropathy  Dispense: 90 capsule; Refill: 1  4. Hyperlipidemia associated with type 2 diabetes mellitus (HCC) - rosuvastatin  (CRESTOR ) 40 MG tablet; Take 1 tablet (40 mg total) by mouth daily.  Dispense: 90 tablet; Refill: 1  5. Other male erectile dysfunction If the increased dose viagra  does not help, he can see urology for options and to check testosterone.  Nitrate warnings discussed.  He has no h/o MI.  He verbalizes understanding - tamsulosin  (FLOMAX ) 0.4 MG CAPS capsule; Take 1 capsule (0.4 mg total) by mouth daily.  Dispense: 30 capsule; Refill: 0 - sildenafil  (VIAGRA ) 100 MG tablet; Take 1 tablet (100 mg total) by mouth daily as needed for erectile dysfunction.  Dispense: 10 tablet; Refill: 2 - Ambulatory referral to Urology  6. Eustachian tube dysfunction, right Phenylephrine 10mg  q4 prn - fluticasone  (FLONASE ) 50 MCG/ACT nasal spray; Place 2 sprays into both nostrils daily.  Dispense: 16 g; Refill: 6 - predniSONE  (DELTASONE ) 10 MG tablet; Take 6 tablets (60 mg total) by mouth daily for 1 day, THEN 5 tablets (50 mg total) daily for 1 day, THEN 4 tablets (40 mg total) daily for 1 day, THEN 3 tablets (30 mg total) daily for 1 day, THEN 2 tablets (20 mg total) daily for 1 day, THEN 1 tablet (10 mg total) daily for 1 day.  Dispense: 21 tablet; Refill: 0    Return in about 3 months (around  11/15/2023) for PCP for chronic conditions.  The patient was given clear instructions to go to ER or return to medical center if symptoms don't improve, worsen or new problems develop. The patient verbalized understanding. The patient was told to call to get lab results if they haven't heard anything in the next week.      Dulce Gibbs, PA-C [facility] [place], [place] [phone]   08/16/2023, 12:01 PM

## 2023-08-25 ENCOUNTER — Other Ambulatory Visit: Payer: Self-pay

## 2023-08-27 ENCOUNTER — Other Ambulatory Visit: Payer: Self-pay

## 2023-08-31 ENCOUNTER — Other Ambulatory Visit: Payer: Self-pay

## 2023-09-05 ENCOUNTER — Encounter: Payer: Self-pay | Admitting: Family Medicine

## 2023-09-05 ENCOUNTER — Other Ambulatory Visit: Payer: Self-pay

## 2023-09-05 ENCOUNTER — Ambulatory Visit: Payer: Medicare Other | Attending: Family Medicine | Admitting: Family Medicine

## 2023-09-05 ENCOUNTER — Other Ambulatory Visit (HOSPITAL_COMMUNITY): Payer: Self-pay

## 2023-09-05 VITALS — BP 106/68 | HR 104 | Ht 73.0 in | Wt 259.4 lb

## 2023-09-05 DIAGNOSIS — Z794 Long term (current) use of insulin: Secondary | ICD-10-CM

## 2023-09-05 DIAGNOSIS — Z7984 Long term (current) use of oral hypoglycemic drugs: Secondary | ICD-10-CM

## 2023-09-05 DIAGNOSIS — E785 Hyperlipidemia, unspecified: Secondary | ICD-10-CM

## 2023-09-05 DIAGNOSIS — Z7985 Long-term (current) use of injectable non-insulin antidiabetic drugs: Secondary | ICD-10-CM | POA: Diagnosis not present

## 2023-09-05 DIAGNOSIS — E1169 Type 2 diabetes mellitus with other specified complication: Secondary | ICD-10-CM

## 2023-09-05 DIAGNOSIS — E1149 Type 2 diabetes mellitus with other diabetic neurological complication: Secondary | ICD-10-CM | POA: Diagnosis not present

## 2023-09-05 DIAGNOSIS — N528 Other male erectile dysfunction: Secondary | ICD-10-CM

## 2023-09-05 DIAGNOSIS — H6991 Unspecified Eustachian tube disorder, right ear: Secondary | ICD-10-CM

## 2023-09-05 MED ORDER — SILDENAFIL CITRATE 100 MG PO TABS
100.0000 mg | ORAL_TABLET | Freq: Every day | ORAL | 2 refills | Status: AC | PRN
  Filled 2023-09-05 – 2023-11-14 (×2): qty 10, 10d supply, fill #0

## 2023-09-05 MED ORDER — OZEMPIC (1 MG/DOSE) 4 MG/3ML ~~LOC~~ SOPN
1.0000 mg | PEN_INJECTOR | SUBCUTANEOUS | 6 refills | Status: DC
  Filled 2023-09-05 (×2): qty 3, 28d supply, fill #0
  Filled 2023-10-09: qty 3, 28d supply, fill #1
  Filled 2023-11-01: qty 3, 28d supply, fill #2

## 2023-09-05 MED ORDER — LANTUS SOLOSTAR 100 UNIT/ML ~~LOC~~ SOPN
36.0000 [IU] | PEN_INJECTOR | Freq: Every day | SUBCUTANEOUS | 6 refills | Status: DC
  Filled 2023-09-05 – 2024-01-03 (×2): qty 30, 83d supply, fill #0
  Filled 2024-04-05: qty 30, 83d supply, fill #1

## 2023-09-05 MED ORDER — ROSUVASTATIN CALCIUM 40 MG PO TABS
40.0000 mg | ORAL_TABLET | Freq: Every day | ORAL | 1 refills | Status: DC
  Filled 2023-09-05 – 2023-11-14 (×2): qty 90, 90d supply, fill #0
  Filled 2024-02-29: qty 90, 90d supply, fill #1

## 2023-09-05 MED ORDER — TAMSULOSIN HCL 0.4 MG PO CAPS
0.4000 mg | ORAL_CAPSULE | Freq: Every day | ORAL | 1 refills | Status: DC
  Filled 2023-09-05 (×2): qty 90, 90d supply, fill #0
  Filled 2024-02-01 – 2024-02-04 (×2): qty 90, 90d supply, fill #1

## 2023-09-05 MED ORDER — EMPAGLIFLOZIN 10 MG PO TABS
10.0000 mg | ORAL_TABLET | Freq: Every day | ORAL | 1 refills | Status: DC
  Filled 2023-09-05 – 2023-11-14 (×2): qty 90, 90d supply, fill #0
  Filled 2024-02-29: qty 90, 90d supply, fill #1

## 2023-09-05 MED ORDER — FLUTICASONE PROPIONATE 50 MCG/ACT NA SUSP
2.0000 | Freq: Every day | NASAL | 6 refills | Status: DC
  Filled 2023-09-05: qty 16, 30d supply, fill #0

## 2023-09-05 MED ORDER — DULOXETINE HCL 60 MG PO CPEP
60.0000 mg | ORAL_CAPSULE | Freq: Every day | ORAL | 1 refills | Status: DC
  Filled 2023-09-05 – 2023-11-14 (×2): qty 90, 90d supply, fill #0
  Filled 2024-02-29: qty 90, 90d supply, fill #1

## 2023-09-05 MED ORDER — GABAPENTIN 300 MG PO CAPS
300.0000 mg | ORAL_CAPSULE | Freq: Every day | ORAL | 1 refills | Status: DC
  Filled 2023-09-05 (×2): qty 90, 90d supply, fill #0

## 2023-09-05 NOTE — Progress Notes (Signed)
 Subjective:  Patient ID: Bill Torres, male    DOB: Sep 01, 1955  Age: 68 y.o. MRN: [id number]  CC: Medical Management of Chronic Issues (No concerns)     Discussed the use of AI scribe software for clinical note transcription with the patient, who gave verbal consent to proceed.  History of Present Illness Bill Torres is a 68 year old male with a history of obesity, type 2 diabetes mellitus (diagnosed in 11/2019), hyperlipidemia, BPH who presents for a follow-up visit.  Diabetes is well controlled with an A1c of 6.0. He experiences no hypoglycemia or lightheadedness. Current medications include 36 units of Lantus , Jardiance  10 mg, and Ozempic  1 mg weekly. Weight has decreased from 262 lbs to 259 lbs, attributed to Ozempic , and he is satisfied with his current weight.  He takes tamsulosin  for an enlarged prostate with no issues. Duloxetine  is used for neuropathy, which presents as persistent decreased sensation in his feet.  He continues to experience numbness in his feet.  He manages toenail care independently despite this.  He expresses concern about medication refills, noting discrepancies between his medication bottles and records. Current medications include duloxetine , insulin , Crestor , and Viagra , with varying refills remaining.    Past Medical History:  Diagnosis Date   Cataract    Dental abscess 12/15/2019   Hyperlipidemia associated with type 2 diabetes mellitus (HCC) 12/14/2019    Past Surgical History:  Procedure Laterality Date   WISDOM TOOTH EXTRACTION      Family History  Problem Relation Age of Onset   Colon cancer Neg Hx    Esophageal cancer Neg Hx    Rectal cancer Neg Hx    Stomach cancer Neg Hx    Colon polyps Neg Hx     Social History   Socioeconomic History   Marital status: Single    Spouse name: Not on file   Number of children: Not on file   Years of education: Not on file   Highest education level: Not on file  Occupational  History   Not on file  Tobacco Use   Smoking status: Never   Smokeless tobacco: Never  Vaping Use   Vaping status: Never Used  Substance and Sexual Activity   Alcohol use: Never   Drug use: Never   Sexual activity: Not on file  Other Topics Concern   Not on file  Social History Narrative   Not on file   Social Drivers of Health   Financial Resource Strain: Low Risk  (06/05/2022)   Overall Financial Resource Strain (CARDIA)    Difficulty of Paying Living Expenses: Not hard at all  Food Insecurity: No Food Insecurity (06/05/2022)   Hunger Vital Sign    Worried About Running Out of Food in the Last Year: Never true    Ran Out of Food in the Last Year: Never true  Transportation Needs: No Transportation Needs (06/05/2022)   PRAPARE - Administrator, Civil Service (Medical): No    Lack of Transportation (Non-Medical): No  Physical Activity: Sufficiently Active (06/05/2022)   Exercise Vital Sign    Days of Exercise per Week: 7 days    Minutes of Exercise per Session: 30 min  Stress: No Stress Concern Present (06/05/2022)   [name]    Feeling of Stress : Not at all  Social Connections: Moderately Integrated (06/05/2022)   Social Connection and Isolation Panel [NHANES]    Frequency of Communication with Friends and  Family: Three times a week    Frequency of Social Gatherings with Friends and Family: Three times a week    Attends Religious Services: More than 4 times per year    Active Member of Clubs or Organizations: Yes    Attends Banker Meetings: Never    Marital Status: Divorced    No Known Allergies  Outpatient Medications Prior to Visit  Medication Sig Dispense Refill   DULoxetine  (CYMBALTA ) 60 MG capsule Take 1 capsule (60 mg total) by mouth daily. For neuropathy 90 capsule 1   insulin  glargine (LANTUS  SOLOSTAR) 100 UNIT/ML Solostar Pen Inject 36 Units into the skin at bedtime. 30  mL 6   rosuvastatin  (CRESTOR ) 40 MG tablet Take 1 tablet (40 mg total) by mouth daily. 90 tablet 1   Semaglutide , 1 MG/DOSE, (OZEMPIC , 1 MG/DOSE,) 4 MG/3ML SOPN Inject 1 mg as directed once a week. 3 mL 6   sildenafil  (VIAGRA ) 100 MG tablet Take 1 tablet (100 mg total) by mouth daily as needed for erectile dysfunction. 10 tablet 2   tamsulosin  (FLOMAX ) 0.4 MG CAPS capsule Take 1 capsule (0.4 mg total) by mouth daily. 30 capsule 0   Accu-Chek Softclix Lancets lancets Use to check blood sugar three times a day 100 each 2   Blood Glucose Monitoring Suppl (ACCU-CHEK GUIDE) w/Device KIT Use to check blood sugar three times a day 1 kit 0   glucose blood test strip USE TO TEST BLOOD SUGAR THREE TIMES A DAY 100 strip 2   Insulin  Pen Needle (TECHLITE PEN NEEDLES) 32G X 4 MM MISC Use to inject Lantus  and Ozempic .Must keep upcoming appointment for more refills. 100 each 2   empagliflozin  (JARDIANCE ) 10 MG TABS tablet Take 1 tablet (10 mg total) by mouth daily. 90 tablet 1   fluticasone  (FLONASE ) 50 MCG/ACT nasal spray Place 2 sprays into both nostrils daily. 16 g 6   No facility-administered medications prior to visit.     ROS Review of Systems  Constitutional:  Negative for activity change and appetite change.  HENT:  Negative for sinus pressure and sore throat.   Respiratory:  Negative for chest tightness, shortness of breath and wheezing.   Cardiovascular:  Negative for chest pain and palpitations.  Gastrointestinal:  Negative for abdominal distention, abdominal pain and constipation.  Genitourinary: Negative.   Musculoskeletal: Negative.   Psychiatric/Behavioral:  Negative for behavioral problems and dysphoric mood.     Objective:  BP 106/68   Pulse (!) 104   Ht 6\' 1"  (1.854 m)   Wt 259 lb 6.4 oz (117.7 kg)   SpO2 98%   BMI 34.22 kg/m      09/05/2023   11:09 AM 08/16/2023   11:10 AM 07/12/2023   10:49 AM  BP/Weight  Systolic BP 106 105   Diastolic BP 68 75   Wt. (Lbs) 259.4 262    BMI 34.22 kg/m2 34.57 kg/m2      Information is confidential and restricted. Go to Review Flowsheets to unlock data.    Wt Readings from Last 3 Encounters:  09/05/23 259 lb 6.4 oz (117.7 kg)  08/16/23 262 lb (118.8 kg)  03/08/23 269 lb (122 kg)     Physical Exam Constitutional:      Appearance: He is well-developed.  Cardiovascular:     Rate and Rhythm: Tachycardia present.     Heart sounds: Normal heart sounds. No murmur heard. Pulmonary:     Effort: Pulmonary effort is normal.     Breath sounds:  Normal breath sounds. No wheezing or rales.  Chest:     Chest wall: No tenderness.  Abdominal:     General: Bowel sounds are normal. There is no distension.     Palpations: Abdomen is soft. There is no mass.     Tenderness: There is no abdominal tenderness.  Musculoskeletal:        General: Normal range of motion.     Right lower leg: No edema.     Left lower leg: No edema.  Neurological:     Mental Status: He is alert and oriented to person, place, and time.  Psychiatric:        Mood and Affect: Mood normal.        Latest Ref Rng & Units 07/12/2023    8:23 AM 03/08/2023   10:52 AM 09/08/2022    8:39 AM  CMP  Glucose 70 - 99 mg/dL 829  88  91   BUN 8 - 23 mg/dL 7  7  5    Creatinine 0.61 - 1.24 mg/dL 5.62  1.30  8.65   Sodium 135 - 145 mmol/L 138  141  141   Potassium 3.5 - 5.1 mmol/L 3.6  4.4  4.7   Chloride 98 - 111 mmol/L 102  101  102   CO2 22 - 32 mmol/L 25  24  23    Calcium  8.9 - 10.3 mg/dL 9.3  9.9  9.6   Total Protein 6.5 - 8.1 g/dL 8.6  7.8  7.4   Total Bilirubin 0.0 - 1.2 mg/dL 1.0  0.7  0.8   Alkaline Phos 38 - 126 U/L 135  155  105   AST 15 - 41 U/L 28  21  19    ALT 0 - 44 U/L 30  23  26      Lipid Panel     Component Value Date/Time   CHOL 128 09/08/2022 0839   TRIG 127 09/08/2022 0839   HDL 35 (L) 09/08/2022 0839   CHOLHDL 7.8 (H) 06/16/2020 1130   CHOLHDL 9.2 12/13/2019 1647   VLDL 60 (H) 12/13/2019 1647   LDLCALC 70 09/08/2022 0839     CBC    Component Value Date/Time   WBC 13.9 (H) 07/12/2023 0823   RBC 5.16 07/12/2023 0823   HGB 15.5 07/12/2023 0823   HGB 15.6 09/08/2022 0839   HCT 46.2 07/12/2023 0823   HCT 45.9 09/08/2022 0839   PLT 321 07/12/2023 0823   PLT 248 09/08/2022 0839   MCV 89.5 07/12/2023 0823   MCV 88 09/08/2022 0839   MCH 30.0 07/12/2023 0823   MCHC 33.5 07/12/2023 0823   RDW 13.2 07/12/2023 0823   RDW 13.3 09/08/2022 0839   LYMPHSABS 2.0 07/12/2023 0823   LYMPHSABS 2.5 09/08/2022 0839   MONOABS 1.1 (H) 07/12/2023 0823   EOSABS 0.4 07/12/2023 0823   EOSABS 0.1 09/08/2022 0839   BASOSABS 0.0 07/12/2023 0823   BASOSABS 0.0 09/08/2022 0839    Lab Results  Component Value Date   HGBA1C 6.0 08/16/2023       Assessment & Plan Diabetic neuropathy Persistent numbness and tingling in feet. Current duloxetine  insufficient. Sensation adequate for toenail care. - Initiate gabapentin  300 mg at bedtime. - Monitor for drowsiness. - Send 90-day medication refills to pharmacy.  Type 2 Diabetes Mellitus Well-controlled with A1c of 6. No hypoglycemia. Current regimen effective. Satisfied with weight. - Continue Lantus , Jardiance , and Ozempic . - Send 90-day medication refills to pharmacy. -Counseled on Diabetic diet, my plate method, 784  minutes of moderate intensity exercise/week Blood sugar logs with fasting goals of 80-120 mg/dl, random of less than 782 and in the event of sugars less than 60 mg/dl or greater than 956 mg/dl encouraged to notify the clinic. Advised on the need for annual eye exams, annual foot exams, Pneumonia vaccine.   Benign Prostatic Hyperplasia Symptoms effectively managed with tamsulosin . No new symptoms. - Continue tamsulosin . - Send 90-day medication refills to pharmacy.   Hyperlipidemia - Controlled - Continue statin - Low-cholesterol diet    Erectile dysfunction Prescription for Viagra  sent to the pharmacy  Meds ordered this encounter  Medications    tamsulosin  (FLOMAX ) 0.4 MG CAPS capsule    Sig: Take 1 capsule (0.4 mg total) by mouth daily.    Dispense:  90 capsule    Refill:  1   DULoxetine  (CYMBALTA ) 60 MG capsule    Sig: Take 1 capsule (60 mg total) by mouth daily. For neuropathy    Dispense:  90 capsule    Refill:  1   empagliflozin  (JARDIANCE ) 10 MG TABS tablet    Sig: Take 1 tablet (10 mg total) by mouth daily.    Dispense:  90 tablet    Refill:  1   fluticasone  (FLONASE ) 50 MCG/ACT nasal spray    Sig: Place 2 sprays into both nostrils daily.    Dispense:  16 g    Refill:  6   insulin  glargine (LANTUS  SOLOSTAR) 100 UNIT/ML Solostar Pen    Sig: Inject 36 Units into the skin at bedtime.    Dispense:  30 mL    Refill:  6   rosuvastatin  (CRESTOR ) 40 MG tablet    Sig: Take 1 tablet (40 mg total) by mouth daily.    Dispense:  90 tablet    Refill:  1   Semaglutide , 1 MG/DOSE, (OZEMPIC , 1 MG/DOSE,) 4 MG/3ML SOPN    Sig: Inject 1 mg as directed once a week.    Dispense:  3 mL    Refill:  6    Discontinue 0.5mg    sildenafil  (VIAGRA ) 100 MG tablet    Sig: Take 1 tablet (100 mg total) by mouth daily as needed for erectile dysfunction.    Dispense:  10 tablet    Refill:  2   gabapentin  (NEURONTIN ) 300 MG capsule    Sig: Take 1 capsule (300 mg total) by mouth at bedtime.    Dispense:  90 capsule    Refill:  1    Follow-up: Return in about 6 months (around 03/07/2024) for Chronic medical conditions.       Joaquin Mulberry, MD, FAAFP. [facility] [place], [place] [phone]   09/05/2023, 12:50 PM

## 2023-09-05 NOTE — Patient Instructions (Signed)
 VISIT SUMMARY:  Bill Torres, a 68 year old male, came in for a follow-up visit. His diabetes is well controlled, and he has no issues with hypoglycemia. He has experienced a slight weight loss, which he is happy with. He also discussed his ongoing management of an enlarged prostate and peripheral neuropathy. Additionally, he expressed concerns about medication refills.  YOUR PLAN:  -PERIPHERAL NEUROPATHY: Peripheral neuropathy is a condition that causes numbness and tingling in the feet. Your current medication, duloxetine , is not fully addressing the symptoms. We are adding gabapentin  300 mg at bedtime to help manage the numbness and tingling. Please monitor for any drowsiness as a side effect. We will send a 90-day refill for your medications to the pharmacy.  -TYPE 2 DIABETES MELLITUS: Type 2 diabetes is a condition where your body does not use insulin  properly, leading to high blood sugar levels. Your diabetes is well controlled with an A1c of 6.0, and you are not experiencing any hypoglycemia. Continue taking Lantus , Jardiance , and Ozempic  as prescribed. We will send a 90-day refill for your medications to the pharmacy.  -BENIGN PROSTATIC HYPERPLASIA: Benign prostatic hyperplasia is an enlarged prostate gland that can cause urinary symptoms. Your symptoms are well managed with tamsulosin , and you have no new symptoms. Continue taking tamsulosin  as prescribed. We will send a 90-day refill for your medications to the pharmacy.  INSTRUCTIONS:  We will send 90-day refills for all your medications to the pharmacy. Please monitor for any drowsiness from the new medication, gabapentin . Continue with your current medications and follow up as needed.

## 2023-09-06 ENCOUNTER — Ambulatory Visit: Payer: Self-pay | Admitting: Family Medicine

## 2023-09-06 LAB — LP+NON-HDL CHOLESTEROL
Cholesterol, Total: 132 mg/dL (ref 100–199)
HDL: 38 mg/dL — ABNORMAL LOW (ref >39)
LDL Chol Calc (NIH): 76 mg/dL (ref 0–99)
Total Non-HDL-Chol (LDL+VLDL): 94 mg/dL (ref 0–129)
Triglycerides: 97 mg/dL (ref 0–149)
VLDL Cholesterol Cal: 18 mg/dL (ref 5–40)

## 2023-09-12 ENCOUNTER — Ambulatory Visit: Admitting: Urology

## 2023-10-09 ENCOUNTER — Other Ambulatory Visit (HOSPITAL_COMMUNITY): Payer: Self-pay

## 2023-11-01 ENCOUNTER — Other Ambulatory Visit: Payer: Self-pay

## 2023-11-14 ENCOUNTER — Other Ambulatory Visit: Payer: Self-pay

## 2023-11-14 ENCOUNTER — Other Ambulatory Visit: Payer: Self-pay | Admitting: Family Medicine

## 2023-11-14 MED ORDER — TRUEPLUS 5-BEVEL PEN NEEDLES 32G X 4 MM MISC
2 refills | Status: AC
  Filled 2023-11-14: qty 100, 100d supply, fill #0
  Filled 2024-02-29: qty 100, 100d supply, fill #1

## 2023-11-20 ENCOUNTER — Telehealth: Payer: Self-pay | Admitting: Family Medicine

## 2023-11-20 NOTE — Telephone Encounter (Signed)
 Fax has been recived and will be completed by end of week and faxed.

## 2023-11-20 NOTE — Telephone Encounter (Signed)
 Copied from CRM #[id number]. Topic: Clinical - Order For Equipment >> Nov 20, 2023  3:09 PM Zebedee SAUNDERS wrote:  Reason for RMF:Mzrzpczi call from [facility] per [facility] ph: [phone] regarding fax sent for Blood Glucose monitor supplies. Please complete and fax.

## 2023-11-28 NOTE — Telephone Encounter (Signed)
 Copied from CRM #[id number]. Topic: Clinical - Order For Equipment >> Nov 28, 2023  8:15 AM Avram MATSU wrote:  Reason for CRM: [name] is calling us  from [facility] and is missing a most recent office note. The form is need for blood Glucose Monitor supplies.   [phone]

## 2023-11-29 ENCOUNTER — Telehealth: Payer: Self-pay | Admitting: Family Medicine

## 2023-11-29 NOTE — Telephone Encounter (Signed)
 Duplicate message

## 2023-11-29 NOTE — Telephone Encounter (Signed)
Last office visit note has been faxed.  

## 2023-11-29 NOTE — Telephone Encounter (Signed)
 Copied from CRM #[id number]. Topic: Clinical - Order For Equipment >> Nov 28, 2023  8:15 AM Avram MATSU wrote: Reason for CRM: [name] is calling us  from [facility] and is missing a most recent office note. The form is need for blood Glucose Monitor supplies.   [phone] >> Nov 29, 2023  8:51 AM Montie POUR wrote: Please call back [facility] at [phone] to let them know if order for a blood Glucose Monitor ( [employer]) will be signed and sent back to them for [name]. Thanks

## 2024-01-03 ENCOUNTER — Other Ambulatory Visit: Payer: Self-pay

## 2024-02-04 ENCOUNTER — Other Ambulatory Visit: Payer: Self-pay

## 2024-02-04 ENCOUNTER — Other Ambulatory Visit (HOSPITAL_COMMUNITY): Payer: Self-pay

## 2024-02-05 ENCOUNTER — Other Ambulatory Visit: Payer: Self-pay

## 2024-02-05 ENCOUNTER — Other Ambulatory Visit (HOSPITAL_COMMUNITY): Payer: Self-pay

## 2024-02-14 ENCOUNTER — Emergency Department (HOSPITAL_COMMUNITY)

## 2024-02-14 ENCOUNTER — Encounter (HOSPITAL_COMMUNITY): Payer: Self-pay

## 2024-02-14 ENCOUNTER — Other Ambulatory Visit: Payer: Self-pay

## 2024-02-14 ENCOUNTER — Observation Stay (HOSPITAL_COMMUNITY): Admission: EM | Admit: 2024-02-14 | Discharge: 2024-02-15 | Disposition: A | Discharge status: Home / Self Care

## 2024-02-14 ENCOUNTER — Observation Stay (HOSPITAL_COMMUNITY)

## 2024-02-14 DIAGNOSIS — E1142 Type 2 diabetes mellitus with diabetic polyneuropathy: Secondary | ICD-10-CM | POA: Diagnosis not present

## 2024-02-14 DIAGNOSIS — N400 Enlarged prostate without lower urinary tract symptoms: Secondary | ICD-10-CM | POA: Diagnosis not present

## 2024-02-14 DIAGNOSIS — E785 Hyperlipidemia, unspecified: Secondary | ICD-10-CM | POA: Insufficient documentation

## 2024-02-14 DIAGNOSIS — R7982 Elevated C-reactive protein (CRP): Secondary | ICD-10-CM | POA: Insufficient documentation

## 2024-02-14 DIAGNOSIS — Z794 Long term (current) use of insulin: Secondary | ICD-10-CM | POA: Insufficient documentation

## 2024-02-14 DIAGNOSIS — M316 Other giant cell arteritis: Secondary | ICD-10-CM | POA: Diagnosis not present

## 2024-02-14 DIAGNOSIS — E1140 Type 2 diabetes mellitus with diabetic neuropathy, unspecified: Secondary | ICD-10-CM | POA: Diagnosis present

## 2024-02-14 DIAGNOSIS — M952 Other acquired deformity of head: Secondary | ICD-10-CM | POA: Diagnosis not present

## 2024-02-14 DIAGNOSIS — W19XXXA Unspecified fall, initial encounter: Secondary | ICD-10-CM | POA: Insufficient documentation

## 2024-02-14 DIAGNOSIS — E1169 Type 2 diabetes mellitus with other specified complication: Secondary | ICD-10-CM

## 2024-02-14 DIAGNOSIS — E1165 Type 2 diabetes mellitus with hyperglycemia: Secondary | ICD-10-CM | POA: Diagnosis not present

## 2024-02-14 DIAGNOSIS — E119 Type 2 diabetes mellitus without complications: Secondary | ICD-10-CM

## 2024-02-14 DIAGNOSIS — Z1159 Encounter for screening for other viral diseases: Secondary | ICD-10-CM | POA: Diagnosis not present

## 2024-02-14 DIAGNOSIS — Z7985 Long-term (current) use of injectable non-insulin antidiabetic drugs: Secondary | ICD-10-CM | POA: Diagnosis not present

## 2024-02-14 DIAGNOSIS — Z7984 Long term (current) use of oral hypoglycemic drugs: Secondary | ICD-10-CM | POA: Insufficient documentation

## 2024-02-14 DIAGNOSIS — R519 Headache, unspecified: Secondary | ICD-10-CM | POA: Diagnosis present

## 2024-02-14 DIAGNOSIS — G44221 Chronic tension-type headache, intractable: Secondary | ICD-10-CM

## 2024-02-14 LAB — GLUCOSE, CAPILLARY: Glucose-Capillary: 214 mg/dL — ABNORMAL HIGH (ref 70–99)

## 2024-02-14 LAB — CBC WITH DIFFERENTIAL/PLATELET
Abs Immature Granulocytes: 0.01 K/uL (ref 0.00–0.07)
Basophils Absolute: 0.1 K/uL (ref 0.0–0.1)
Basophils Relative: 1 %
Eosinophils Absolute: 0.3 K/uL (ref 0.0–0.5)
Eosinophils Relative: 4 %
HCT: 47.2 % (ref 39.0–52.0)
Hemoglobin: 15.6 g/dL (ref 13.0–17.0)
Immature Granulocytes: 0 %
Lymphocytes Relative: 29 %
Lymphs Abs: 2.3 K/uL (ref 0.7–4.0)
MCH: 29.5 pg (ref 26.0–34.0)
MCHC: 33.1 g/dL (ref 30.0–36.0)
MCV: 89.2 fL (ref 80.0–100.0)
Monocytes Absolute: 0.6 K/uL (ref 0.1–1.0)
Monocytes Relative: 7 %
Neutro Abs: 4.7 K/uL (ref 1.7–7.7)
Neutrophils Relative %: 59 %
Platelets: 289 K/uL (ref 150–400)
RBC: 5.29 MIL/uL (ref 4.22–5.81)
RDW: 13.3 % (ref 11.5–15.5)
WBC: 7.9 K/uL (ref 4.0–10.5)
nRBC: 0 % (ref 0.0–0.2)

## 2024-02-14 LAB — COMPREHENSIVE METABOLIC PANEL WITH GFR
ALT: 31 U/L (ref 0–44)
AST: 25 U/L (ref 15–41)
Albumin: 3.7 g/dL (ref 3.5–5.0)
Alkaline Phosphatase: 149 U/L — ABNORMAL HIGH (ref 38–126)
Anion gap: 10 (ref 5–15)
BUN: 5 mg/dL — ABNORMAL LOW (ref 8–23)
CO2: 25 mmol/L (ref 22–32)
Calcium: 8.9 mg/dL (ref 8.9–10.3)
Chloride: 107 mmol/L (ref 98–111)
Creatinine, Ser: 0.93 mg/dL (ref 0.61–1.24)
GFR, Estimated: >60 mL/min (ref >60)
Glucose, Bld: 92 mg/dL (ref 70–99)
Potassium: 3.8 mmol/L (ref 3.5–5.1)
Sodium: 142 mmol/L (ref 135–145)
Total Bilirubin: 1.1 mg/dL (ref 0.0–1.2)
Total Protein: 7.7 g/dL (ref 6.5–8.1)

## 2024-02-14 LAB — HIV ANTIBODY (ROUTINE TESTING W REFLEX): HIV Screen 4th Generation wRfx: NONREACTIVE

## 2024-02-14 LAB — C-REACTIVE PROTEIN: CRP: 1.1 mg/dL — ABNORMAL HIGH (ref <1.0)

## 2024-02-14 LAB — SEDIMENTATION RATE: Sed Rate: 19 mm/h — ABNORMAL HIGH (ref 0–16)

## 2024-02-14 MED ORDER — SODIUM CHLORIDE 0.9 % IV SOLN
1000.0000 mg | INTRAVENOUS | Status: AC
  Administered 2024-02-14: 1000 mg via INTRAVENOUS
  Filled 2024-02-14: qty 16

## 2024-02-14 MED ORDER — DULOXETINE HCL 60 MG PO CPEP
60.0000 mg | ORAL_CAPSULE | Freq: Every day | ORAL | Status: DC
  Administered 2024-02-15: 60 mg via ORAL
  Filled 2024-02-14: qty 1

## 2024-02-14 MED ORDER — INSULIN GLARGINE-YFGN 100 UNIT/ML ~~LOC~~ SOLN
18.0000 [IU] | Freq: Every day | SUBCUTANEOUS | Status: DC
Start: 2024-02-14 — End: 2024-02-14
  Filled 2024-02-14: qty 0.18

## 2024-02-14 MED ORDER — INSULIN GLARGINE-YFGN 100 UNIT/ML ~~LOC~~ SOLN
36.0000 [IU] | Freq: Every day | SUBCUTANEOUS | Status: DC
  Administered 2024-02-14: 36 [IU] via SUBCUTANEOUS
  Filled 2024-02-14 (×2): qty 0.36

## 2024-02-14 MED ORDER — EMPAGLIFLOZIN 10 MG PO TABS
10.0000 mg | ORAL_TABLET | Freq: Every day | ORAL | Status: DC
  Administered 2024-02-15: 10 mg via ORAL
  Filled 2024-02-14: qty 1

## 2024-02-14 MED ORDER — ACETAMINOPHEN 325 MG PO TABS
650.0000 mg | ORAL_TABLET | Freq: Four times a day (QID) | ORAL | Status: DC | PRN
  Administered 2024-02-15: 650 mg via ORAL
  Filled 2024-02-14: qty 2

## 2024-02-14 MED ORDER — ROSUVASTATIN CALCIUM 20 MG PO TABS
40.0000 mg | ORAL_TABLET | Freq: Every day | ORAL | Status: DC
Start: 2024-02-14 — End: 2024-02-15
  Administered 2024-02-14 – 2024-02-15 (×2): 40 mg via ORAL
  Filled 2024-02-14 (×2): qty 2

## 2024-02-14 MED ORDER — TAMSULOSIN HCL 0.4 MG PO CAPS
0.4000 mg | ORAL_CAPSULE | Freq: Every day | ORAL | Status: DC
  Administered 2024-02-14 – 2024-02-15 (×2): 0.4 mg via ORAL
  Filled 2024-02-14 (×2): qty 1

## 2024-02-14 NOTE — ED Provider Notes (Signed)
 Accepted handoff at shift change from [place] PA-C. Please see prior provider note for more detail.   Briefly: Patient is 68 y.o.   DDX: concern for temporal arteritis  Plan: Follow-up on temporal artery ultrasound      Physical Exam  BP 117/75 (BP Location: Right Arm)   Pulse 89   Temp 98.2 F (36.8 C) (Oral)   Resp 16   Ht 6' 2 (1.88 m)   SpO2 100%   BMI 33.30 kg/m   Physical Exam  Procedures  Procedures  ED Course / MDM    Medical Decision Making Amount and/or Complexity of Data Reviewed Labs: ordered.  Risk Decision regarding hospitalization.   Patient will have HR 50, new onset headache, temporal artery tenderness which satisfies 3/5 yet  Temporal artery ultrasound concerning for trans arteritis  Will consult for admission, will provide 1 g of IV Solu-Medrol.     Neldon Hamp RAMAN, [place] 02/14/24 1602    Mannie Pac T, DO 02/15/24 2240

## 2024-02-14 NOTE — ED Notes (Signed)
 Ned Sprang - daughter would like a call back  [phone]

## 2024-02-14 NOTE — ED Triage Notes (Addendum)
 Pt. Stated, Ive had left side head pain for 21/2 months. Its like someone sticking sharp needles in my head.

## 2024-02-14 NOTE — ED Provider Notes (Signed)
 Bill Torres EMERGENCY DEPARTMENT AT [facility] Provider Note   CSN: [id number] Arrival date & time: 02/14/24  9064     Patient presents with: Headache   Bill Torres is a 68 y.o. male.  Past history significant for type 2 diabetes, polycythemia, neuropathy, and cataracts who presents the emergency department concerns of left-sided headache.  States he has been having ongoing left-sided headache for about 2-1/2 months which he describes as a sharp needle type sensation along the left temple.  He states that pain typically radiates from the left temple towards the back of the left eye into the left posterior head.  No reported nausea, vomiting, dizziness.  Denies any history of migraine headaches.  He currently only takes medications for his diabetes and neuropathy and denies any other medication use.   Headache      Prior to Admission medications   Medication Sig Start Date End Date Taking? Authorizing Provider  Accu-Chek Softclix Lancets lancets Use to check blood sugar three times a day 05/12/22   Newlin, Enobong, MD  Blood Glucose Monitoring Suppl (ACCU-CHEK GUIDE) w/Device KIT Use to check blood sugar three times a day 11/18/21   Newlin, Enobong, MD  DULoxetine  (CYMBALTA ) 60 MG capsule Take 1 capsule (60 mg total) by mouth daily. For neuropathy. 09/05/23   Newlin, Enobong, MD  empagliflozin  (JARDIANCE ) 10 MG TABS tablet Take 1 tablet (10 mg total) by mouth daily. 09/05/23   Newlin, Enobong, MD  fluticasone  (FLONASE ) 50 MCG/ACT nasal spray Place 2 sprays into both nostrils daily. 09/05/23   Newlin, Enobong, MD  gabapentin  (NEURONTIN ) 300 MG capsule Take 1 capsule (300 mg total) by mouth at bedtime. 09/05/23   Newlin, Enobong, MD  glucose blood test strip USE TO TEST BLOOD SUGAR THREE TIMES A DAY 07/17/23   Newlin, Enobong, MD  insulin  glargine (LANTUS  SOLOSTAR) 100 UNIT/ML Solostar Pen Inject 36 Units into the skin at bedtime. 09/05/23   Newlin, Enobong, MD  Insulin  Pen  Needle (TRUEPLUS 5-BEVEL PEN NEEDLES) 32G X 4 MM MISC Use to inject Lantus  and Ozempic .Must keep upcoming appointment for more refills. 11/14/23   Newlin, Enobong, MD  rosuvastatin  (CRESTOR ) 40 MG tablet Take 1 tablet (40 mg total) by mouth daily. 09/05/23   Newlin, Enobong, MD  Semaglutide , 1 MG/DOSE, (OZEMPIC , 1 MG/DOSE,) 4 MG/3ML SOPN Inject 1 mg as directed once a week. 09/05/23   Newlin, Enobong, MD  sildenafil  (VIAGRA ) 100 MG tablet Take 1 tablet (100 mg total) by mouth daily as needed for erectile dysfunction. 09/05/23   Newlin, Enobong, MD  tamsulosin  (FLOMAX ) 0.4 MG CAPS capsule Take 1 capsule (0.4 mg total) by mouth daily. 09/05/23   Newlin, Enobong, MD    Allergies: Patient has no known allergies.    Review of Systems  Neurological:  Positive for headaches.  All other systems reviewed and are negative.   Updated Vital Signs BP (!) 133/109   Pulse (!) 114   Temp 98.5 F (36.9 C)   Resp 16   Ht 6' 2 (1.88 m)   SpO2 97%   BMI 33.30 kg/m   Physical Exam Vitals and nursing note reviewed.  Constitutional:      General: He is not in acute distress.    Appearance: He is well-developed.  HENT:     Head: Normocephalic and atraumatic.  Eyes:     General: No visual field deficit or scleral icterus.    Extraocular Movements: Extraocular movements intact.     Conjunctiva/sclera: Conjunctivae normal.  Pupils: Pupils are equal, round, and reactive to light. Pupils are equal.  Cardiovascular:     Rate and Rhythm: Normal rate and regular rhythm.     Heart sounds: No murmur heard.    Comments: Left temporal artery with strong pulse present with right side being unremarkable. Pulmonary:     Effort: Pulmonary effort is normal. No respiratory distress.     Breath sounds: Normal breath sounds.  Abdominal:     Palpations: Abdomen is soft.     Tenderness: There is no abdominal tenderness.  Musculoskeletal:        General: No swelling.     Cervical back: Neck supple.  Skin:     General: Skin is warm and dry.     Capillary Refill: Capillary refill takes less than 2 seconds.  Neurological:     Mental Status: He is alert.     Cranial Nerves: No cranial nerve deficit, dysarthria or facial asymmetry.  Psychiatric:        Mood and Affect: Mood normal.     (all labs ordered are listed, but only abnormal results are displayed) Labs Reviewed  SEDIMENTATION RATE - Abnormal; Notable for the following components:      Result Value   Sed Rate 19 (*)    All other components within normal limits  C-REACTIVE PROTEIN - Abnormal; Notable for the following components:   CRP 1.1 (*)    All other components within normal limits  COMPREHENSIVE METABOLIC PANEL WITH GFR - Abnormal; Notable for the following components:   BUN 5 (*)    Alkaline Phosphatase 149 (*)    All other components within normal limits  CBC WITH DIFFERENTIAL/PLATELET    EKG: None  Radiology: TEMPORAL ARTERY Result Date: 02/14/2024  TEMPORAL ARTERY REPORT Patient Name:  Bill Torres  Date of Exam:   02/14/2024 Medical Rec #: [id number]              Accession #:    [id number] Date of Birth: Oct 09, 1955               Patient Gender: M Patient Age:   57 years Exam Location:  [facility] Procedure:      VAS US  TEMPORAL ARTERY BILATERAL Referring Phys: Amaryah Mallen --------------------------------------------------------------------------------  Indications: Shooting pain in left temple/forehead x 2 months. Patient states              pressing against the site of most pain in the forehead is the only              thing that stops the pain. High Risk Factors: Age > 50 yrs.  Comparison Study: No prior study Performing Technologist: Alberta Lis RVS  Examination Guidelines: Patient in reclined position. 2D, color and spectral doppler sampling in the temporal artery along the hairline and temple in the longitudinal plane. 2D images along the hairline and temple in the transverse plane. Exam is bilateral.   Dilated and tortuous proximal left temporal artery with thickened walls. The proximal to mid portions of the artery do not fully compress. Summary: Absence of a halo sign in the right temporal artery, although not definitive, makes a diagnosis of temporal arteritis unlikely. 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     Preliminary      Procedures   Medications Ordered in the ED - No data to display  Medical Decision Making Amount and/or Complexity of Data Reviewed Labs: ordered.   This patient presents to the ED for concern of headache, this involves an extensive number of treatment options, and is a complaint that carries with it a high risk of complications and morbidity.  The differential diagnosis includes migraine headache, stroke, transient arteritis, head injury   Co morbidities that complicate the patient evaluation  Type 2 diabetes, diabetic neuropathy   Lab Tests:  I Ordered, and personally interpreted labs.  The pertinent results include: CBC unremarkable, CMP slight ovation alkaline phosphatase at 149, ESR slightly elevated at 19, CRP elevated at 1.1   Imaging Studies ordered:  I ordered imaging studies including ultrasound of bilateral temporal arteries I independently visualized and interpreted imaging which showed pending I agree with the radiologist interpretation   Problem List / ED Course / Critical interventions / Medication management  Patient presents the emergency department with concerns of a left-sided headache off and on for last 2-1/2 months.  Reports feeling of pain behind left eye and the left posterior head that he describes as a sharp pick like sensation in this area.  He also endorses feelings of pain along the left temple that he massages that helps alleviate his pain.  Has not had much luck with over-the-counter medications.  No prior history of similar symptoms. On  exam, patient has notably palpable and what appears to be slightly dilated left temporal artery.  Right side is unremarkable with no appreciable pulse in this area.  Basic ophthalmic exam is unremarkable with no signs of pupillary defect and EOMs are intact. Workup shows unremarkable CBC and CMP.  Sedimentation rate slightly elevated at 19 and C-reactive protein slightly elevated at 1.1.  Ultrasound of the temporal arteries are pending. I have reviewed the patients home medicines and have made adjustments as needed  3:30PM Care of Bill Torres transferred to [facility] and Dr. Mannie at the end of my shift as the patient will require reassessment once labs/imaging have resulted. Patient presentation, ED course, and plan of care discussed with review of all pertinent labs and imaging. Please see his/her note for further details regarding further ED course and disposition. Plan at time of handoff is final disposition per US  results and findings for concerns of temporal arteritis. This may be altered or completely changed at the discretion of the oncoming team pending results of further workup.  Social Determinants of Health:  None   Test / Admission - Considered:  Final disposition per oncoming team  Final diagnoses:  None    ED Discharge Orders     None          Cecily Legrand LABOR, PA-C 02/14/24 1539    Lowther, Amy, DO 02/15/24 1618

## 2024-02-14 NOTE — H&P (Cosign Needed Addendum)
 Date: 02/14/2024               Patient Name:  Bill Torres MRN: [id number]  DOB: Feb 05, 1956 Age / Sex: 68 y.o., male   PCP: Delbert Clam, MD         Medical Service: Internal Medicine Teaching Service         Attending Physician: Dr. Shawn Sick, MD      First Contact: [name], MD  Pager:  [phone]  Second Contact: Dr. Damien Lease, DO  Pager:  [phone]       After Hours  (After 5pm / First Contact Pager: [phone]  weekends / holidays): Second Contact Pager: [phone]   SUBJECTIVE   Chief Complaint: Left temporal pain  History of Present Illness: Bill Torres is a 68 y.o. male with PMH of insulin  depended DMII (last A1C 6.0 6 months ago) with diabetic neuropathy, HLD and BPH presented with episodic left temporal pain. His pain began 2-3 months ago without any inciting incident or injury. He characterizes the pain as a shooting, stabbing pain that emanates from his left ear towards his left eye. The episodes last less than a minute and are alleviated by putting pressure over the left temporal bone. He has anywhere between 1-10 episodes per day. He has no associated eye pain or vision changes. He does have some left arm soreness that has been happening for a few weeks, with no associated weakness. He does not have any other concerns today. He had not tried any medications for his symptoms. Specifically, he denies nausea and vomiting, numbness, tingling, chest pain, shortness of breath, changes in hearing, headaches, fevers and chills.   He also states that he had a fall 2 weeks ago while putting on his pants and fell backward and hit his head on the steps. He did not lose consciousness. There was no bleeding but he did have a small headache afterwards. He still notices a indent over his left parietal region.  He has no personal of family history of RA, Lupus, Sjogren's, or other autoimmune conditions.   ED Course: Vitals were unremarkable Labs  significant for Elevated CRP to 1.1 and ESR of 19. ALP elevated at 149. Ultrasound showed halo sign of left temporal artery. Received 1000mg  Solu-medrol in the ED Consulted Vascular surgery for temporal artery biopsy.  Meds:  Insulin  36 units nightly (none taken today) Cymbalta  60 mg daily Jardiance  10 mg daily Crestor  40 mg daily Ozempic  once per week, 1mg  dose: last dose was Monday Viagra  100 mg prn for ED Flomax  0.4 mg daily  Past Medical History Past Medical History:  Diagnosis Date   Cataract    Dental abscess 12/15/2019   Hyperlipidemia associated with type 2 diabetes mellitus (HCC) 12/14/2019  T2DM Neuropathy  HLD  Past Surgical History Past Surgical History:  Procedure Laterality Date   WISDOM TOOTH EXTRACTION    Bilateral cataract surgery  Social:  Lives With: Daughter Support: Family, community Level of Function: Independent in ADLs and iADLs PCP: Delbert Clam, MD  Substances: -Tobacco: None -Alcohol: None -Recreational Drug: None  Family History:  No family history of autoimmune conditions  Family History  Problem Relation Age of Onset   Colon cancer Neg Hx    Esophageal cancer Neg Hx    Rectal cancer Neg Hx    Stomach cancer Neg Hx    Colon polyps Neg Hx     Allergies: Allergies as of 02/14/2024   (No Known Allergies)  Review of Systems: A complete ROS was negative except as per HPI.   OBJECTIVE:   Physical Exam: Blood pressure 117/75, pulse 89, temperature 98.2 F (36.8 C), temperature source Oral, resp. rate 16, height 6' 2 (1.88 m), SpO2 100%.  Constitutional: alert , well-appearing  sitting in, in no acute distress HENT: normocephalic atraumatic, mucous membranes moist. Fullness palpable over the superior and anterior temporal regions around the ear. Large indent over parietal skull, see picture. Eyes: conjunctiva non-erythematous. Xanthelasmas of bilateral eyelids. Cardiovascular: regular rate and rhythm, no m/r/g. 2+  peripheral pulses. Pulmonary/Chest: normal work of breathing on room air, lungs clear to auscultation bilaterally Abdominal: bowel sounds normal, soft, non-tender, non-distended MSK: normal bulk and tone Neurological: alert & oriented x 3, CN II-XII intact. Good sensation in the upper and lower extremities, mildly diminished in the feet. 5/5 strength in bilateral upper and lower extremities, normal gait Skin: warm and dry. No ulcerations of the feet. Some skin flaking of bilateral feet.    Media Information  Document Information  Photos  Right head indentation  02/14/2024 17:10  Attached To:  Hospital Encounter on 02/14/24  Source Information  Shante Maysonet, Melvenia, MD  [facility]    Labs: CBC    Component Value Date/Time   WBC 7.9 02/14/2024 1150   RBC 5.29 02/14/2024 1150   HGB 15.6 02/14/2024 1150   HGB 15.6 09/08/2022 0839   HCT 47.2 02/14/2024 1150   HCT 45.9 09/08/2022 0839   PLT 289 02/14/2024 1150   PLT 248 09/08/2022 0839   MCV 89.2 02/14/2024 1150   MCV 88 09/08/2022 0839   MCH 29.5 02/14/2024 1150   MCHC 33.1 02/14/2024 1150   RDW 13.3 02/14/2024 1150   RDW 13.3 09/08/2022 0839   LYMPHSABS 2.3 02/14/2024 1150   LYMPHSABS 2.5 09/08/2022 0839   MONOABS 0.6 02/14/2024 1150   EOSABS 0.3 02/14/2024 1150   EOSABS 0.1 09/08/2022 0839   BASOSABS 0.1 02/14/2024 1150   BASOSABS 0.0 09/08/2022 0839     CMP     Component Value Date/Time   NA 142 02/14/2024 1150   NA 141 03/08/2023 1052   K 3.8 02/14/2024 1150   CL 107 02/14/2024 1150   CO2 25 02/14/2024 1150   GLUCOSE 92 02/14/2024 1150   BUN 5 (L) 02/14/2024 1150   BUN 7 (L) 03/08/2023 1052   CREATININE 0.93 02/14/2024 1150   CALCIUM  8.9 02/14/2024 1150   PROT 7.7 02/14/2024 1150   PROT 7.8 03/08/2023 1052   ALBUMIN 3.7 02/14/2024 1150   ALBUMIN 4.5 03/08/2023 1052   AST 25 02/14/2024 1150   ALT 31 02/14/2024 1150   ALKPHOS 149 (H) 02/14/2024 1150   BILITOT 1.1 02/14/2024 1150   BILITOT 0.7  03/08/2023 1052   GFRNONAA >60 02/14/2024 1150   GFRAA 97 06/16/2020 1130     Latest Reference Range & Units 02/14/24 11:50  CRP <1.0 mg/dL 1.1 (H)  (H): Data is abnormally high   Latest Reference Range & Units 02/14/24 11:50  Sed Rate 0 - 16 mm/hr 19 (H)  (H): Data is abnormally high Imaging:  CT HEAD WO CONTRAST ( ) CLINICAL DATA:  Hit head with large indentation on left parietal bone  EXAM: CT HEAD WITHOUT CONTRAST  TECHNIQUE: Contiguous axial images were obtained from the base of the skull through the vertex without intravenous contrast.  RADIATION DOSE REDUCTION: This exam was performed according to the departmental dose-optimization program which includes automated exposure control, adjustment of the mA and/or  kV according to patient size and/or use of iterative reconstruction technique.  COMPARISON:  None Available.  FINDINGS: Brain: No acute territorial infarction, hemorrhage, or intracranial mass. Atrophy and mild chronic small vessel ischemic changes of the white matter. Nonenlarged ventricles  Vascular: No hyperdense vessels.  No unexpected calcification  Skull: Normal. Negative for fracture or focal lesion.  Sinuses/Orbits: No acute finding.  Other: None  IMPRESSION: 1. No CT evidence for acute intracranial abnormality. 2. Atrophy and mild chronic small vessel ischemic changes of the white matter.  Electronically Signed   By: Luke Bun M.D.   On: 02/14/2024 17:48 TEMPORAL ARTERY  TEMPORAL ARTERY REPORT  Patient Name:  JUSIAH AGUAYO  Date of Exam:   02/14/2024 Medical Rec #: [id number]              Accession #:    [id number] Date of Birth: 1955-06-25               Patient Gender: M Patient Age:   10 years Exam Location:  [facility] Procedure:      VAS US  TEMPORAL ARTERY BILATERAL Referring Phys: OSCAR ZELAYA  --------------------------------------------------------------------------------   Indications: Shooting pain in  left temple/forehead x 2 months. Patient states              pressing against the site of most pain in the forehead is the only              thing that stops the pain.  High Risk Factors: Age > 50 yrs.   Comparison Study: No prior study  Performing Technologist: Alberta Lis RVS    Examination Guidelines: Patient in reclined position. 2D, color and spectral doppler sampling in the temporal artery along the hairline and temple in the longitudinal plane. 2D images along the hairline and temple in the transverse plane. Exam is bilateral.    Dilated and tortuous proximal left temporal artery with thickened walls. The proximal to mid portions of the artery do not fully compress. Summary: Absence of a halo sign in the right temporal artery, although not definitive, makes a diagnosis of temporal arteritis unlikely. 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       Electronically signed by Debby Robertson on 02/14/2024 at 4:51:42 PM.    Final     ASSESSMENT & PLAN:   Assessment & Plan by Problem: Principal Problem:   Giant cell arteritis (HCC)   Keyshaun Exley is a 68 y.o. person living with a history of PMH of insulin  depended DMII (last A1C 6.0 6 months ago) with diabetic neuropathy, HLD and BPH presented with episodic left temporal pain, with ultrasound findings of temporal arteritis, and admitted for management and workup of temporal arteritis.  #Temporal arteritis Presented with 2.5 months of stabbing, episodic pain that is relieved with manual pressure. Elevated ESR to 19 and CRP of 1.1. Ultrasound showed halo sign of the left temporal artery and an absence of the halo sign on the right temporal artery. He received 1000mg  IV solumedrol in the ED. He does not have any focal neurologic deficits or visual symptoms. Vascular surgery consulted by the ED. He will likely need a prolonged course of steroids and a referral to a  rheumatologist. His peripheral pulses were 2+, suggesting no PAD. -1000mg  IV solumedrol in the ED -60mg  Prednisone  once daily -Consider transitioning to Tocilizumab to avoid long-term steroids -Quantiferon gold to assess TB status if starting the above immunosuppressive medication. -pending vascular  surgery consult for biopsy  #DMII with peripheral neuropathy Last A1c 6.0 6 months ago. Home does glargine 36 units nightly, Jardiance  10mg  daily, Ozempic  1mg  once weekly. He has peripheral neuropathy which is managed with Cymbalta  60mg  daily -Continue Jardiance  10mg  daily -Continue Cymbalta  60mg  daily -Will start insulin  18 units tonight with current NPO status, will likely need to increase if he is not NPO and in the setting of him receiving high dose steroids per above.  #Fall #Skull indentation Recent fall 2 weeks ago without neurologic symptoms but with large indentation on physical exam. CT head showed no acute intracranial abnormality. Patient does not currently endorse any pain.  #HLD Previously with triglycerides as high as 337 and LDL of 211. 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 Most recent Lipid panel with LDL of 76 and triglycerides of 97. Controlled with Crestor  40mg  daily. -Continue Crestor  40mg  daily.  #BPH -Continue Flomax  0.4mg  daily.   Diet: NPO for now, will start carb modified if no surgery planned for this evening VTE: SCDs Holding pharmacologic VTE for pending biopsy Code: Full Surrogate Decision Maker: Daughter, number in chart  Prior to Admission Living Arrangement: Home, living with daughter Anticipated Discharge Location: Home Barriers to Discharge: Pending Biopsy  Dispo: Admit patient to Observation with expected length of stay less than 2 midnights.  Signed: Napoleon Limes, MD Internal Medicine Resident PGY-1 02/14/2024, 6:01 PM   Please contact IM Residency On-Call Pager at: [phone] or [phone].

## 2024-02-14 NOTE — Hospital Course (Addendum)
-  Cholelithiasis without secondary signs of acute cholecystitis. -Increased hepatic parenchymal echogenicity suggestive of steatosis. -Temporal artery ultrasound concerning for trans arteritis    Temporal arteritis? GCA Slightly elevated ESR and CRP Clinical Dx US : concerning for temporal arteritis    Presence of a halo sign in the left temporal artery suggests temporal arteritis.

## 2024-02-14 NOTE — Progress Notes (Signed)
 VASCULAR LAB    Temporal artery duplex has been performed.  See CV proc for preliminary results.   Diyana Starrett, RVT 02/14/2024, 2:59 PM

## 2024-02-15 ENCOUNTER — Other Ambulatory Visit (HOSPITAL_COMMUNITY): Payer: Self-pay

## 2024-02-15 DIAGNOSIS — M952 Other acquired deformity of head: Secondary | ICD-10-CM

## 2024-02-15 DIAGNOSIS — Z9181 History of falling: Secondary | ICD-10-CM

## 2024-02-15 DIAGNOSIS — M316 Other giant cell arteritis: Secondary | ICD-10-CM | POA: Diagnosis not present

## 2024-02-15 DIAGNOSIS — E1142 Type 2 diabetes mellitus with diabetic polyneuropathy: Secondary | ICD-10-CM | POA: Diagnosis not present

## 2024-02-15 DIAGNOSIS — Z7984 Long term (current) use of oral hypoglycemic drugs: Secondary | ICD-10-CM

## 2024-02-15 DIAGNOSIS — Z79899 Other long term (current) drug therapy: Secondary | ICD-10-CM

## 2024-02-15 DIAGNOSIS — E785 Hyperlipidemia, unspecified: Secondary | ICD-10-CM

## 2024-02-15 DIAGNOSIS — Z794 Long term (current) use of insulin: Secondary | ICD-10-CM

## 2024-02-15 DIAGNOSIS — Z7985 Long-term (current) use of injectable non-insulin antidiabetic drugs: Secondary | ICD-10-CM

## 2024-02-15 LAB — BASIC METABOLIC PANEL WITH GFR
Anion gap: 9 (ref 5–15)
BUN: 10 mg/dL (ref 8–23)
CO2: 24 mmol/L (ref 22–32)
Calcium: 9.1 mg/dL (ref 8.9–10.3)
Chloride: 103 mmol/L (ref 98–111)
Creatinine, Ser: 0.93 mg/dL (ref 0.61–1.24)
GFR, Estimated: >60 mL/min (ref >60)
Glucose, Bld: 170 mg/dL — ABNORMAL HIGH (ref 70–99)
Potassium: 4.3 mmol/L (ref 3.5–5.1)
Sodium: 136 mmol/L (ref 135–145)

## 2024-02-15 LAB — HEPATITIS PANEL, ACUTE
HCV Ab: NONREACTIVE
Hep A IgM: NONREACTIVE
Hep B C IgM: NONREACTIVE
Hepatitis B Surface Ag: NONREACTIVE

## 2024-02-15 LAB — GLUCOSE, CAPILLARY
Glucose-Capillary: 125 mg/dL — ABNORMAL HIGH (ref 70–99)
Glucose-Capillary: 125 mg/dL — ABNORMAL HIGH (ref 70–99)

## 2024-02-15 LAB — HEPATITIS B SURFACE ANTIBODY,QUALITATIVE: Hep B S Ab: NONREACTIVE

## 2024-02-15 MED ORDER — DEXCOM G7 SENSOR MISC
0 refills | Status: AC
  Filled 2024-02-15: qty 3, 30d supply, fill #0

## 2024-02-15 MED ORDER — PANTOPRAZOLE SODIUM 40 MG PO TBEC
40.0000 mg | DELAYED_RELEASE_TABLET | Freq: Every day | ORAL | Status: DC
  Administered 2024-02-15: 40 mg via ORAL
  Filled 2024-02-15: qty 1

## 2024-02-15 MED ORDER — PREDNISONE 20 MG PO TABS: 60.0000 mg | ORAL_TABLET | Freq: Every day | ORAL | Status: DC

## 2024-02-15 MED ORDER — PREDNISONE 20 MG PO TABS
40.0000 mg | ORAL_TABLET | Freq: Every day | ORAL | Status: DC
Start: 2024-02-29 — End: 2024-02-15

## 2024-02-15 MED ORDER — PANTOPRAZOLE SODIUM 40 MG PO TBEC
40.0000 mg | DELAYED_RELEASE_TABLET | Freq: Every day | ORAL | 0 refills | Status: DC
  Filled 2024-02-15: qty 30, 30d supply, fill #0

## 2024-02-15 MED ORDER — OZEMPIC (1 MG/DOSE) 4 MG/3ML ~~LOC~~ SOPN: 1.0000 mg | PEN_INJECTOR | SUBCUTANEOUS | Status: DC

## 2024-02-15 MED ORDER — PREDNISONE 5 MG PO TABS
35.0000 mg | ORAL_TABLET | Freq: Every day | ORAL | 0 refills | Status: AC
  Filled 2024-02-15: qty 49, 7d supply, fill #0

## 2024-02-15 MED ORDER — PREDNISONE 10 MG PO TABS
ORAL_TABLET | ORAL | 0 refills | Status: AC
  Filled 2024-02-15: qty 124, 27d supply, fill #0

## 2024-02-15 MED ORDER — PREDNISONE 10 MG PO TABS: 50.0000 mg | ORAL_TABLET | Freq: Every day | ORAL | Status: DC

## 2024-02-15 MED ORDER — PREDNISONE 20 MG PO TABS: 35.0000 mg | ORAL_TABLET | Freq: Every day | ORAL | Status: DC

## 2024-02-15 MED ORDER — INSULIN ASPART 100 UNIT/ML IJ SOLN: 0.0000 [IU] | Freq: Every day | INTRAMUSCULAR | Status: DC

## 2024-02-15 MED ORDER — SULFAMETHOXAZOLE-TRIMETHOPRIM 400-80 MG PO TABS
1.0000 | ORAL_TABLET | Freq: Every day | ORAL | 0 refills | Status: DC
  Filled 2024-02-15: qty 23, 23d supply, fill #0

## 2024-02-15 MED ORDER — PREDNISONE 10 MG PO TABS
40.0000 mg | ORAL_TABLET | Freq: Every day | ORAL | 0 refills | Status: AC
  Filled 2024-02-15: qty 28, 7d supply, fill #0

## 2024-02-15 MED ORDER — INSULIN ASPART 100 UNIT/ML IJ SOLN
0.0000 [IU] | Freq: Three times a day (TID) | INTRAMUSCULAR | Status: DC
  Administered 2024-02-15: 2 [IU] via SUBCUTANEOUS

## 2024-02-15 MED ORDER — PREDNISONE 10 MG PO TABS
50.0000 mg | ORAL_TABLET | Freq: Every day | ORAL | 0 refills | Status: AC
  Filled 2024-02-15: qty 35, 7d supply, fill #0

## 2024-02-15 MED ORDER — PREDNISONE 20 MG PO TABS
60.0000 mg | ORAL_TABLET | Freq: Every day | ORAL | Status: DC
  Administered 2024-02-15: 60 mg via ORAL
  Filled 2024-02-15: qty 3

## 2024-02-15 NOTE — Plan of Care (Signed)

## 2024-02-15 NOTE — Progress Notes (Signed)
 HD#0 SUBJECTIVE:  Patient Summary: Bill Torres is a 68 y.o. male living with a history of PMH of insulin  depended DMII (last A1C 6.0 6 months ago) with diabetic neuropathy, HLD and BPH presented with episodic left temporal pain, with ultrasound findings of temporal arteritis, and admitted for management and workup of temporal arteritis.   Overnight Events: None  Interim History: He has had about 4-5 episodes of sharp stinging pain since his admission. He has not noted any side effects from the steroids. He denies weakness, numbness, tingling, chest pain, shortness of breath.  OBJECTIVE:  Vital Signs: Vitals:   02/14/24 1829 02/14/24 2106 02/15/24 0050 02/15/24 0618  BP: 110/76 116/78 (!) 87/44 112/68  Pulse: 97 88 78 81  Resp: 18 17 16 18   Temp: 98.8 F (37.1 C) 98.8 F (37.1 C) 98.2 F (36.8 C) 98.2 F (36.8 C)  TempSrc: Oral     SpO2:  98% 96% 99%  Weight: 117.6 kg     Height: 6' 2 (1.88 m)      Supplemental O2: Room Air SpO2: 99 %  Filed Weights   02/14/24 1829  Weight: 117.6 kg     Intake/Output Summary (Last 24 hours) at 02/15/2024 0631 Last data filed at 02/14/2024 2000 Gross per 24 hour  Intake 300 ml  Output --  Net 300 ml   Net IO Since Admission: 300 mL [02/15/24 0631]  Physical Exam: Physical Exam Constitutional:      General: He is not in acute distress.    Appearance: He is not ill-appearing.  Eyes:     General: Gaze aligned appropriately. No visual field deficit or scleral icterus.    Extraocular Movements:     Right eye: Normal extraocular motion and no nystagmus.     Left eye: Normal extraocular motion and no nystagmus.     Conjunctiva/sclera:     Right eye: Right conjunctiva is not injected.     Left eye: Left conjunctiva is not injected.     Pupils: Pupils are equal, round, and reactive to light.  Cardiovascular:     Rate and Rhythm: Normal rate and regular rhythm.  Pulmonary:     Effort: No respiratory distress.     Breath  sounds: No wheezing or rales.  Abdominal:     General: There is no distension.     Palpations: Abdomen is soft.     Tenderness: There is no abdominal tenderness.  Neurological:     Mental Status: He is alert and oriented to person, place, and time.     Cranial Nerves: No cranial nerve deficit or facial asymmetry.     Sensory: No sensory deficit.     Motor: No weakness.      Patient Lines/Drains/Airways Status     Active Line/Drains/Airways     Name Placement date Placement time Site Days   Peripheral IV 02/14/24 18 G Right Antecubital 02/14/24  1641  Antecubital  1            Pertinent labs and imaging:      Latest Ref Rng & Units 02/14/2024   11:50 AM 07/12/2023    8:23 AM 09/08/2022    8:39 AM  CBC  WBC 4.0 - 10.5 K/uL 7.9  13.9  6.7   Hemoglobin 13.0 - 17.0 g/dL 84.3  84.4  84.3   Hematocrit 39.0 - 52.0 % 47.2  46.2  45.9   Platelets 150 - 400 K/uL 289  321  248  Latest Ref Rng & Units 02/15/2024    1:49 AM 02/14/2024   11:50 AM 07/12/2023    8:23 AM  CMP  Glucose 70 - 99 mg/dL 829  92  877   BUN 8 - 23 mg/dL 10  5  7    Creatinine 0.61 - 1.24 mg/dL 9.06  9.06  9.14   Sodium 135 - 145 mmol/L 136  142  138   Potassium 3.5 - 5.1 mmol/L 4.3  3.8  3.6   Chloride 98 - 111 mmol/L 103  107  102   CO2 22 - 32 mmol/L 24  25  25    Calcium  8.9 - 10.3 mg/dL 9.1  8.9  9.3   Total Protein 6.5 - 8.1 g/dL  7.7  8.6   Total Bilirubin 0.0 - 1.2 mg/dL  1.1  1.0   Alkaline Phos 38 - 126 U/L  149  135   AST 15 - 41 U/L  25  28   ALT 0 - 44 U/L  31  30     CT HEAD WO CONTRAST ( ) Result Date: 02/14/2024 CLINICAL DATA:  Hit head with large indentation on left parietal bone EXAM: CT HEAD WITHOUT CONTRAST TECHNIQUE: Contiguous axial images were obtained from the base of the skull through the vertex without intravenous contrast. RADIATION DOSE REDUCTION: This exam was performed according to the departmental dose-optimization program which includes automated exposure  control, adjustment of the mA and/or kV according to patient size and/or use of iterative reconstruction technique. COMPARISON:  None Available. FINDINGS: Brain: No acute territorial infarction, hemorrhage, or intracranial mass. Atrophy and mild chronic small vessel ischemic changes of the white matter. Nonenlarged ventricles Vascular: No hyperdense vessels.  No unexpected calcification Skull: Normal. Negative for fracture or focal lesion. Sinuses/Orbits: No acute finding. Other: None IMPRESSION: 1. No CT evidence for acute intracranial abnormality. 2. Atrophy and mild chronic small vessel ischemic changes of the white matter. Electronically Signed   By: Luke Bun M.D.   On: 02/14/2024 17:48   TEMPORAL ARTERY Result Date: 02/14/2024  TEMPORAL ARTERY REPORT Patient Name:  Bill Torres  Date of Exam:   02/14/2024 Medical Rec #: [id number]              Accession #:    [id number] Date of Birth: 1955/11/20               Patient Gender: M Patient Age:   52 years Exam Location:  [facility] Procedure:      VAS US  TEMPORAL ARTERY BILATERAL Referring Phys: OSCAR ZELAYA --------------------------------------------------------------------------------  Indications: Shooting pain in left temple/forehead x 2 months. Patient states              pressing against the site of most pain in the forehead is the only              thing that stops the pain. High Risk Factors: Age > 50 yrs.  Comparison Study: No prior study Performing Technologist: Alberta Lis RVS  Examination Guidelines: Patient in reclined position. 2D, color and spectral doppler sampling in the temporal artery along the hairline and temple in the longitudinal plane. 2D images along the hairline and temple in the transverse plane. Exam is bilateral.  Dilated and tortuous proximal left temporal artery with thickened walls. The proximal to mid portions of the artery do not fully compress. Summary: Absence of a halo sign in the right temporal  artery, although not definitive, makes a diagnosis of temporal arteritis unlikely.  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   Electronically signed by Debby Robertson on 02/14/2024 at 4:51:42 PM.   Final     ASSESSMENT/PLAN:  Assessment: Principal Problem:   Giant cell arteritis (HCC) Active Problems:   Diabetes mellitus (HCC)   Diabetic neuropathy (HCC)   Fall  Bill Torres is a 68 y.o. male living with a history of PMH of insulin  depended DMII (last A1C 6.0 6 months ago) with diabetic neuropathy, HLD and BPH presented with episodic left temporal pain, with ultrasound findings of temporal arteritis, and admitted for management and workup of temporal arteritis.   Plan: #Temporal arteritis  Presented with 2.5 months of stabbing, episodic pain that is relieved with manual pressure over his temporal region. Elevated ESR to 19 and CRP of 1.1. Ultrasound showed halo sign of the left temporal artery and an absence of the halo sign on the right temporal artery. He received 1000mg  IV solumedrol in the ED. He does not have any focal neurologic deficits or visual symptoms. Vascular surgery consulted by the ED and plan to see him today. He will likely need a prolonged course of steroids and a referral to a rheumatologist. His peripheral pulses were 2+, suggesting no PAD. - Start 60mg  Prednisone  once daily - Start Protonix 40mg  daily - This patient would benefit from transitioning to Tocilizumab to reduce steroid course from 52 to 26 weeks to reduce long-term steroids in a patient with potential adverse effects from long term steroids, such as HHS. -Quantiferon gold to assess TB status if starting the above immunosuppressive medication. -Hepatitis B and C screening -pending vascular surgery consult, will likely not require biopsy given his positive ultrasound. -Referral to rheumatology and ophthalmology outpatient.   #DMII with peripheral neuropathy Last  A1c 6.0 6 months ago. Home does glargine 36 units nightly, Jardiance  10mg  daily, Ozempic  1mg  once weekly. He has peripheral neuropathy which is managed with Cymbalta  60mg  daily. Glucose elevated to 214 after starting high dose steroids. He received 36 units of glargine. His glucose is 170 today on fasting BMP -Continue Jardiance  10mg  daily -Continue Cymbalta  60mg  daily -Will likely need to increase insulin  while on steroids. Consider CGM for this patient for potentially challenging insulin  dosing requirements.   #Fall #Skull indentation Recent fall 2 weeks ago without neurologic symptoms but with large indentation on physical exam. CT head showed no acute intracranial abnormality. Patient does not currently endorse any pain.   #HLD Previously with triglycerides as high as 337 and LDL of 211. 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 Most recent Lipid panel with LDL of 76 and triglycerides of 97. Controlled with Crestor  40mg  daily. -Continue Crestor  40mg  daily.   #BPH -Continue Flomax  0.4mg  daily.  Best Practice: Diet: Diabetic diet VTE: SCDs Start: 02/14/24 1728 Code: Full  Disposition planning: DISPO: Anticipated discharge today to Home  Signature:  Shion Bluestein [facility]  6:31 AM, 02/15/2024  On Call pager [phone]

## 2024-02-15 NOTE — Care Management Obs Status (Signed)
 MEDICARE OBSERVATION STATUS NOTIFICATION   Patient Details  Name: Bill Torres MRN: [id number] Date of Birth: 1956/03/11   Medicare Observation Status Notification Given:  Yes  Obs notice signed and the copy will be mailed to the patient home address.   [name] 02/15/2024, 2:19 PM

## 2024-02-15 NOTE — Discharge Summary (Signed)
 Name: Bill Torres MRN: [id number] DOB: Aug 09, 1955 68 y.o. PCP: Bill Clam, MD  Date of Admission: 02/14/2024  9:41 AM Date of Discharge: 02/15/2024 Attending Physician: Dr. Jone Dauphin  Discharge Diagnosis: 1. Principal Problem:   Giant cell arteritis (HCC) Active Problems:   Diabetes mellitus (HCC)   Diabetic neuropathy (HCC)   Fall    Discharge Medications: Allergies as of 02/15/2024   No Known Allergies      Medication List     TAKE these medications    Accu-Chek Softclix Lancets lancets Use to check blood sugar three times a day   ascorbic acid 500 MG tablet Commonly known as: VITAMIN C Take 500 mg by mouth daily.   cholecalciferol 25 MCG (1000 UNIT) tablet Commonly known as: VITAMIN D3 Take 1,000 Units by mouth daily.   cyanocobalamin 100 MCG tablet Commonly known as: VITAMIN B12 Take 100 mcg by mouth daily.   Dexcom G7 Sensor Misc Change sensor every 10 days   DULoxetine  60 MG capsule Commonly known as: Cymbalta  Take 1 capsule (60 mg total) by mouth daily. For neuropathy.   glucose blood test strip USE TO TEST BLOOD SUGAR THREE TIMES A DAY   Jardiance  10 MG Tabs tablet Generic drug: empagliflozin  Take 1 tablet (10 mg total) by mouth daily.   Lantus  SoloStar 100 UNIT/ML Solostar Pen Generic drug: insulin  glargine Inject 36 Units into the skin at bedtime.   latanoprost 0.005 % ophthalmic solution Commonly known as: XALATAN Place 1 drop into both eyes at bedtime.   Ozempic  (1 MG/DOSE) 4 MG/3ML Sopn Generic drug: Semaglutide  (1 MG/DOSE) Inject 1 mg as directed once a week. Monday   pantoprazole 40 MG tablet Commonly known as: PROTONIX Take 1 tablet (40 mg total) by mouth daily. Start taking on: February 16, 2024   predniSONE  10 MG tablet Commonly known as: DELTASONE  Take 6 tablets (60 mg total) by mouth daily with breakfast for 6 days, THEN 5 tablets (50 mg total) daily with breakfast for 7 days, THEN 4 tablets (40 mg  total) daily with breakfast for 7 days, THEN 3.5 tablets (35 mg total) daily with breakfast for 7 days. Start taking on: February 16, 2024   predniSONE  10 MG tablet Commonly known as: DELTASONE  Take 5 tablets (50 mg total) by mouth daily with breakfast for 7 days. Start taking on: February 22, 2024   predniSONE  10 MG tablet Commonly known as: DELTASONE  Take 4 tablets (40 mg total) by mouth daily with breakfast for 7 days. Start taking on: February 29, 2024   predniSONE  5 MG tablet Commonly known as: DELTASONE  Take 7 tablets (35 mg total) by mouth daily with breakfast for 7 days. Start taking on: March 07, 2024   rosuvastatin  40 MG tablet Commonly known as: CRESTOR  Take 1 tablet (40 mg total) by mouth daily.   sildenafil  100 MG tablet Commonly known as: Viagra  Take 1 tablet (100 mg total) by mouth daily as needed for erectile dysfunction.   sulfamethoxazole-trimethoprim 400-80 MG tablet Commonly known as: Bactrim Take 1 tablet by mouth daily.   tamsulosin  0.4 MG Caps capsule Commonly known as: FLOMAX  Take 1 capsule (0.4 mg total) by mouth daily.   TRUEplus 5-Bevel Pen Needles 32G X 4 MM Misc Generic drug: Insulin  Pen Needle Use to inject Lantus  and Ozempic .Must keep upcoming appointment for more refills.   vitamin E 180 MG (400 UNITS) capsule Take 400 Units by mouth daily.        Disposition and follow-up:  [name] was discharged from [facility] in good condition. At the hospital follow up visit please address:  1.  Temporal arteritis  -Patient with symptoms of sharp pain in the left temporal region, ultrasound confimed temporal arteritis.  -Started on Prednisone  60mg  daily for 7 days, then 50 for 7 days, then 40 for 7 days, then 35 for seven days, will need prednisone  taper for 52 weeks of steroids alone or 26 weeks if he is started on Tocilizumab. -Please assess for symptom control and medication adherence with steroids,   Bactrim, and Protonix. Consider stopping Bactrim once steroid dose is sufficiently decreased. -Please ensure follow up with rheumatology on 10/30 as below for further recommendations regarding GCA management -Please see taper schedule at the bottom of the discharge summary for continued steroid taper, and continue prescriptions as appropriate. -Please consider Tocilizumab to avoid adverse effects of long term steroid use in this patient with insulin  dependent diabetes. -Please follow-up results of quatiferon TB testing if patient will be starting on a biologic. He is Hepatitis B non-immune but does not have active Hepatitis B, C, or HIV -Please consider referral to ophthalmology -Consider obtaining ESR and CRP to assess treatment response.  2. TIIDM  -Previously well controlled on home regimen Glargine 36 units nightly, Ozempic  1mg , and Jardiance  10mg  daily.  -Due to high dose steroids as above, patient may require increased insulin .  -Patient discharged with CGM sensors and was instructed on proper use.  -Please evaluate CGM data and adjust diabetes regimen as necessary, patient has been tolerating Ozempic  and may benefit from increased dose.   3.  Labs / imaging needed at time of follow-up: Review CGM data, consider ESR and CRP  4.  Pending labs/ test needing follow-up: Quantiferon TB gold  Follow-up Appointments:  Follow-up Information     Lorren Greig PARAS, NP Follow up on 02/29/2024.   Specialty: Nurse Practitioner Why: at 2:00PM Contact information: [address] [place] [address] [phone]         Dolphus Reiter, MD Follow up on 02/21/2024.   Specialty: Rheumatology Why: at 10:20 AM Contact information: [address] [place] [address] [phone]                  Hospital Course by problem list: Bill Torres is a 68 y.o. male living with a history of PMH of insulin  depended DMII (last A1C 6.0 6 months ago) with  diabetic neuropathy, HLD and BPH presented with episodic left temporal pain, with ultrasound findings of temporal arteritis, and admitted for management and workup of temporal arteritis now being discharged with the following pertinent hospital course:  #Temporal arteritis  Presented with 2.5 months of stabbing, episodic pain that was relieved with manual pressure over his temporal region. Elevated ESR to 19 and CRP of 1.1. Ultrasound showed halo sign of the left temporal artery and an absence of the halo sign on the right temporal artery. He received 1000mg  IV solumedrol in the ED. He did not have any focal neurologic deficits or visual symptoms. Vascular surgery consulted and recommended against temporal artery biopsy. He was transitioned to oral steroids on hospital day 2. He was started on a steroid taper as detailed at the bottom of this discharge summary. He was screened for Hepatitis B and C and was found to be Hepatitis B non-immune. TB test pending at time of discharge. He was discharged on prednisone  60mg  for one week with instructions to taper, Protonix  40mg  daily, Bactrim 400-80mg  daily for PJP prophylaxis while on high dose steroids. He was set up with an appointment with rheumatology for hospital follow up on 10/30.    #DMII with peripheral neuropathy Last A1c 6.0 6 months ago. Home regimen glargine 36 units nightly, Jardiance  10mg  daily, Ozempic  1mg  once weekly. He has peripheral neuropathy which is managed with Cymbalta  60mg  daily. Glucose elevated to 214 after starting high dose steroids. He was started on a CGM for monitoring of his blood glucose while on high dose prednisone , and potential adjustment of his diabetes regimen.   #Fall #Skull indentation Recent fall 2 weeks ago without neurologic symptoms but with large indentation on physical exam. CT head showed no acute intracranial abnormality. Patient was asymptomatic this hospitalization.   #HLD Previously with triglycerides as  high as 337 and LDL of 211. 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 Most recent Lipid panel with LDL of 76 and triglycerides of 97. Controlled with Crestor  40mg  daily.   #BPH Home Flomax  0.4mg  daily.    Discharge Subjective He has had about 4-5 episodes of sharp stinging pain since his admission. He has not noted any side effects from the steroids. He denies weakness, numbness, tingling, chest pain, shortness of breath.   Discharge Exam:   BP 117/70 (BP Location: Left Arm)   Pulse 81   Temp 98.6 F (37 C)   Resp 18   Ht 6' 2 (1.88 m)   Wt 117.6 kg   SpO2 97%   BMI 33.29 kg/m  Discharge exam:  Constitutional:      General: He is not in acute distress.    Appearance: He is not ill-appearing.  Eyes:     General: Gaze aligned appropriately. No visual field deficit or scleral icterus.    Extraocular Movements:     Right eye: Normal extraocular motion and no nystagmus.     Left eye: Normal extraocular motion and no nystagmus.     Conjunctiva/sclera:     Right eye: Right conjunctiva is not injected.     Left eye: Left conjunctiva is not injected.     Pupils: Pupils are equal, round, and reactive to light.  Cardiovascular:     Rate and Rhythm: Normal rate and regular rhythm. 2+ peripheral pulses  Pulmonary:     Effort: No respiratory distress.     Breath sounds: No wheezing or rales.  Abdominal:     General: There is no distension.     Palpations: Abdomen is soft.     Tenderness: There is no abdominal tenderness.  Neurological:     Mental Status: He is alert and oriented to person, place, and time.     Cranial Nerves: No cranial nerve deficit or facial asymmetry.     Sensory: No sensory deficit.     Motor: No weakness.     Pertinent Labs, Studies, and Procedures:     Latest Ref Rng & Units 02/14/2024   11:50 AM 07/12/2023    8:23 AM 09/08/2022    8:39 AM  CBC  WBC 4.0 - 10.5 K/uL 7.9  13.9  6.7   Hemoglobin 13.0 - 17.0 g/dL 84.3  84.4  84.3   Hematocrit 39.0 - 52.0 %  47.2  46.2  45.9   Platelets 150 - 400 K/uL 289  321  248        Latest Ref Rng & Units 02/15/2024    1:49 AM 02/14/2024   11:50 AM 07/12/2023    8:23 AM  CMP  Glucose  70 - 99 mg/dL 829  92  877   BUN 8 - 23 mg/dL 10  5  7    Creatinine 0.61 - 1.24 mg/dL 9.06  9.06  9.14   Sodium 135 - 145 mmol/L 136  142  138   Potassium 3.5 - 5.1 mmol/L 4.3  3.8  3.6   Chloride 98 - 111 mmol/L 103  107  102   CO2 22 - 32 mmol/L 24  25  25    Calcium  8.9 - 10.3 mg/dL 9.1  8.9  9.3   Total Protein 6.5 - 8.1 g/dL  7.7  8.6   Total Bilirubin 0.0 - 1.2 mg/dL  1.1  1.0   Alkaline Phos 38 - 126 U/L  149  135   AST 15 - 41 U/L  25  28   ALT 0 - 44 U/L  31  30     CT HEAD WO CONTRAST ( ) Result Date: 02/14/2024 CLINICAL DATA:  Hit head with large indentation on left parietal bone EXAM: CT HEAD WITHOUT CONTRAST TECHNIQUE: Contiguous axial images were obtained from the base of the skull through the vertex without intravenous contrast. RADIATION DOSE REDUCTION: This exam was performed according to the departmental dose-optimization program which includes automated exposure control, adjustment of the mA and/or kV according to patient size and/or use of iterative reconstruction technique. COMPARISON:  None Available. FINDINGS: Brain: No acute territorial infarction, hemorrhage, or intracranial mass. Atrophy and mild chronic small vessel ischemic changes of the white matter. Nonenlarged ventricles Vascular: No hyperdense vessels.  No unexpected calcification Skull: Normal. Negative for fracture or focal lesion. Sinuses/Orbits: No acute finding. Other: None IMPRESSION: 1. No CT evidence for acute intracranial abnormality. 2. Atrophy and mild chronic small vessel ischemic changes of the white matter. Electronically Signed   By: Luke Bun M.D.   On: 02/14/2024 17:48   TEMPORAL ARTERY Result Date: 02/14/2024  TEMPORAL ARTERY REPORT Patient Name:  Bill Torres  Date of Exam:   02/14/2024 Medical Rec #:  [id number]              Accession #:    [id number] Date of Birth: 1956/03/18               Patient Gender: M Patient Age:   24 years Exam Location:  [facility] Procedure:      VAS US  TEMPORAL ARTERY BILATERAL Referring Phys: OSCAR ZELAYA --------------------------------------------------------------------------------  Indications: Shooting pain in left temple/forehead x 2 months. Patient states              pressing against the site of most pain in the forehead is the only              thing that stops the pain. High Risk Factors: Age > 50 yrs.  Comparison Study: No prior study Performing Technologist: Alberta Lis RVS  Examination Guidelines: Patient in reclined position. 2D, color and spectral doppler sampling in the temporal artery along the hairline and temple in the longitudinal plane. 2D images along the hairline and temple in the transverse plane. Exam is bilateral.  Dilated and tortuous proximal left temporal artery with thickened walls. The proximal to mid portions of the artery do not fully compress. Summary: Absence of a halo sign in the right temporal artery, although not definitive, makes a diagnosis of temporal arteritis unlikely. 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   Electronically signed by Debby Robertson on 02/14/2024 at 4:51:42 PM.  Final      Discharge Instructions: Discharge Instructions     Ambulatory referral to Rheumatology   Complete by: As directed    Call MD for:  severe uncontrolled pain   Complete by: As directed    Diet - low sodium heart healthy   Complete by: As directed    Discharge instructions   Complete by: As directed    You were hospitalized for Temporal arteritis (Giant cell arteritis). We treated you with steroids. Thank you for allowing us  to be part of your care.   We arranged for you to follow up at: Your PCP office on 02/29/2024 at 2:00PM.  We arranged for you to follow-up with a rheumatologist on  10/30 at 10:20 AM  Please talk to your PCP about getting an opthalmology referral.  Please note these changes made to your medications:   *Please START taking:  Prednisone  60mg  (6 10mg  tablets) until 10/30 Prednisone  50mg  (5 10 mg tablets) until 11/06 Prednisone  40mg  (4 10 mg tablets) until 11/13 Prednisone  35mg  (3.5 mg tablets) until 11/20 Your rheumatologist can prescribe further Prednisone  for you Protonix 40mg  daily Bactrim 400-80mg  daily Continuous glucose monitor.   Please make sure to follow-up with your PCP and the rheumatologist for futher care.   If you have chest pain or shortness of breath, please seek medical care.  If you have any symptoms of sudden weakness, eye pain, or loss of vision, please return immediately to the ED.   Please use the continuous blood glucose monitor to monitor your blood sugar to collect data for your PCP visit.   Increase activity slowly   Complete by: As directed       You were hospitalized for Temporal arteritis (Giant cell arteritis). We treated you with steroids. Thank you for allowing us  to be part of your care.   We arranged for you to follow up at: Your PCP office on 02/29/2024 at 2:00PM.  We arranged for you to follow-up with a rheumatologist on 10/30 at 10:20 AM  Please talk to your PCP about seeing an ophthalmologist.  Please note these changes made to your medications:   *Please START taking:  Prednisone  60mg  (6 10mg  tablets) until 10/30 Prednisone  50mg  (5 10 mg tablets) until 11/06 Prednisone  40mg  (4 10 mg tablets) until 11/13 Prednisone  35mg  (3.5 mg tablets) until 11/20 Your rheumatologist can prescribe further Prednisone  for you Protonix 40mg  daily Bactrim 400-80mg  daily Continuous glucose monitor.  Please make sure to follow-up with your PCP and the rheumatologist for further care.   If you have chest pain or shortness of breath, please seek medical care.  If you have any symptoms of sudden weakness, eye pain,  or loss of vision, please return immediately to the ED.   Please use the continuous blood glucose monitor to monitor your blood sugar to collect data for your PCP visit.   Taper week Dose (mg/day)  1 60  2 50  3 40  4 35  5 30  6 25  7 20  8  17.5  9 17.5  10 15  11 15  12  12.5  13 10  14 10  15 10  16 10  17 9  18 9  19 9  20 9  21 8  22 8  23 8  24 8  25 7  26 7  27 7  28 7  29 6  30 6  31 6   32 6  33 5  34 5  35 5  36 5  37 4  38 4  39 4  40 4  41 3  42 3  43 3  44 3  45 2  46 2  47  2  48 2  49 1  50 1  51 1  52 1  Tapering doses listed refer to oral prednisone  (or equivalent) typically administered once daily in the morning. Signed: Napoleon Limes, MD 02/15/2024, 2:34 PM

## 2024-02-15 NOTE — Discharge Instructions (Addendum)
 You were hospitalized for Temporal arteritis (Giant cell arteritis). We treated you with steroids. Thank you for allowing us  to be part of your care.   We arranged for you to follow up at: Your PCP office on 02/29/2024 at 2:00PM.  We arranged for you to follow-up with a rheumatologist on 10/30 at 10:20 AM  Please talk to your PCP about seeing an ophthalmologist.  Please note these changes made to your medications:   *Please START taking:  Prednisone  60mg  (6 10mg  tablets) until 10/30 Prednisone  50mg  (5 10 mg tablets) until 11/06 Prednisone  40mg  (4 10 mg tablets) until 11/13 Prednisone  35mg  (3.5 mg tablets) until 11/20 Your rheumatologist can prescribe further Prednisone  for you Protonix 40mg  daily Bactrim 400-80mg  daily Continuous glucose monitor.   Please make sure to follow-up with your PCP and the rheumatologist for futher care.   If you have chest pain or shortness of breath, please seek medical care.  If you have any symptoms of sudden weakness, eye pain, or loss of vision, please return immediately to the ED.   Please use the continuous blood glucose monitor to monitor your blood sugar to collect data for your PCP visit.

## 2024-02-15 NOTE — Consult Note (Signed)
 [facility]   ASSESSMENT / PLAN: 68 y.o. male with giant cell arteritis.  Duplex ultrasound yesterday positive for giant cell arteritis with halo sign about the left temporal artery, which is symptomatic.  Further testing is not needed and treatment for giant cell arteritis can proceed.  CHIEF COMPLAINT: Left temporal pain  HISTORY OF PRESENT ILLNESS: Bill Torres is a 68 y.o. male admitted to the hospital for treatment of left temporal pain.  Patient reports a several month history of severe pain in the left temporal area.  Pressure seems to improve the discomfort.  No visual changes.  Some arm soreness.  No jaw claudication.  Past Medical History:  Diagnosis Date   Cataract    Dental abscess 12/15/2019   Hyperlipidemia associated with type 2 diabetes mellitus (HCC) 12/14/2019    Past Surgical History:  Procedure Laterality Date   WISDOM TOOTH EXTRACTION      Family History  Problem Relation Age of Onset   Colon cancer Neg Hx    Esophageal cancer Neg Hx    Rectal cancer Neg Hx    Stomach cancer Neg Hx    Colon polyps Neg Hx     Social History   Socioeconomic History   Marital status: Single    Spouse name: Not on file   Number of children: Not on file   Years of education: Not on file   Highest education level: Not on file  Occupational History   Not on file  Tobacco Use   Smoking status: Never   Smokeless tobacco: Never  Vaping Use   Vaping status: Never Used  Substance and Sexual Activity   Alcohol use: Never   Drug use: Never   Sexual activity: Not on file  Other Topics Concern   Not on file  Social History Narrative   Not on file   Social Drivers of Health   Financial Resource Strain: Low Risk  (06/05/2022)   Overall Financial Resource Strain (CARDIA)    Difficulty of Paying Living Expenses: Not hard at all  Food Insecurity: No Food Insecurity (02/14/2024)   Hunger Vital Sign    Worried About Running Out of  Food in the Last Year: Never true    Ran Out of Food in the Last Year: Never true  Transportation Needs: No Transportation Needs (02/14/2024)   PRAPARE - Administrator, Civil Service (Medical): No    Lack of Transportation (Non-Medical): No  Physical Activity: Sufficiently Active (06/05/2022)   Exercise Vital Sign    Days of Exercise per Week: 7 days    Minutes of Exercise per Session: 30 min  Stress: No Stress Concern Present (06/05/2022)   [name]    Feeling of Stress : Not at all  Social Connections: Moderately Isolated (02/14/2024)   Social Connection and Isolation Panel    Frequency of Communication with Friends and Family: Three times a week    Frequency of Social Gatherings with Friends and Family: Three times a week    Attends Religious Services: More than 4 times per year    Active Member of Clubs or Organizations: No    Attends Banker Meetings: Never    Marital Status: Divorced  Catering manager Violence: Not At Risk (02/14/2024)   Humiliation, Afraid, Rape, and Kick questionnaire    Fear of Current or Ex-Partner: No    Emotionally Abused: No    Physically Abused: No  Sexually Abused: No    No Known Allergies  Current Facility-Administered Medications  Medication Dose Route Frequency Provider Last Rate Last Admin   acetaminophen  (TYLENOL ) tablet 650 mg  650 mg Oral Q6H PRN Kandis Perkins, DO   650 mg at 02/15/24 9082   DULoxetine  (CYMBALTA ) DR capsule 60 mg  60 mg Oral Daily Kandis Perkins, DO   60 mg at 02/15/24 9090   empagliflozin  (JARDIANCE ) tablet 10 mg  10 mg Oral Daily Kandis Perkins, DO   10 mg at 02/15/24 0909   insulin  aspart (novoLOG ) injection 0-15 Units  0-15 Units Subcutaneous TID WC Smucker, Nathanael, MD       insulin  aspart (novoLOG ) injection 0-5 Units  0-5 Units Subcutaneous QHS Smucker, Nathanael, MD       insulin  glargine-yfgn (SEMGLEE ) injection 36 Units   36 Units Subcutaneous QHS Tobie Gaines, DO   36 Units at 02/14/24 2238   pantoprazole (PROTONIX) EC tablet 40 mg  40 mg Oral Daily Kandis Perkins, DO   40 mg at 02/15/24 0909   [START ON 02/16/2024] predniSONE  (DELTASONE ) tablet 60 mg  60 mg Oral Q breakfast Pham, Minh Q, RPH-CPP       Followed by   NOREEN ON 02/22/2024] predniSONE  (DELTASONE ) tablet 50 mg  50 mg Oral Q breakfast Pham, Minh Q, RPH-CPP       Followed by   NOREEN ON 02/29/2024] predniSONE  (DELTASONE ) tablet 40 mg  40 mg Oral Q breakfast Pham, Minh Q, RPH-CPP       Followed by   NOREEN ON 03/07/2024] predniSONE  (DELTASONE ) tablet 35 mg  35 mg Oral Q breakfast Pham, Minh Q, RPH-CPP       rosuvastatin  (CRESTOR ) tablet 40 mg  40 mg Oral Daily Bender, Perkins, DO   40 mg at 02/15/24 9090   tamsulosin  (FLOMAX ) capsule 0.4 mg  0.4 mg Oral Daily Bender, Perkins, DO   0.4 mg at 02/15/24 0909    PHYSICAL EXAM Vitals:   02/14/24 2106 02/15/24 0050 02/15/24 0618 02/15/24 0752  BP: 116/78 (!) 87/44 112/68 104/70  Pulse: 88 78 81 77  Resp: 17 16 18 18   Temp: 98.8 F (37.1 C) 98.2 F (36.8 C) 98.2 F (36.8 C) (!) 97.4 F (36.3 C)  TempSrc:      SpO2: 98% 96% 99% 97%  Weight:      Height:       Well-appearing elderly man in no distress Regular rate and rhythm Unlabored breathing  No cranial nerve abnormalities No extremity weakness  PERTINENT LABORATORY AND RADIOLOGIC DATA  Most recent CBC    Latest Ref Rng & Units 02/14/2024   11:50 AM 07/12/2023    8:23 AM 09/08/2022    8:39 AM  CBC  WBC 4.0 - 10.5 K/uL 7.9  13.9  6.7   Hemoglobin 13.0 - 17.0 g/dL 84.3  84.4  84.3   Hematocrit 39.0 - 52.0 % 47.2  46.2  45.9   Platelets 150 - 400 K/uL 289  321  248      Most recent CMP    Latest Ref Rng & Units 02/15/2024    1:49 AM 02/14/2024   11:50 AM 07/12/2023    8:23 AM  CMP  Glucose 70 - 99 mg/dL 829  92  877   BUN 8 - 23 mg/dL 10  5  7    Creatinine 0.61 - 1.24 mg/dL 9.06  9.06  9.14   Sodium 135 - 145 mmol/L 136  142  138  Potassium 3.5 - 5.1 mmol/L 4.3  3.8  3.6   Chloride 98 - 111 mmol/L 103  107  102   CO2 22 - 32 mmol/L 24  25  25    Calcium  8.9 - 10.3 mg/dL 9.1  8.9  9.3   Total Protein 6.5 - 8.1 g/dL  7.7  8.6   Total Bilirubin 0.0 - 1.2 mg/dL  1.1  1.0   Alkaline Phos 38 - 126 U/L  149  135   AST 15 - 41 U/L  25  28   ALT 0 - 44 U/L  31  30     Renal function Estimated Creatinine Clearance: 103.7 mL/min (by C-G formula based on SCr of 0.93 mg/dL).  HbA1c, POC (controlled diabetic range) (%)  Date Value  08/16/2023 6.0    LDL Chol Calc (NIH)  Date Value Ref Range Status  09/05/2023 76 0 - 99 mg/dL Final     TEMPORAL ARTERY REPORT   Patient Name:  Bill Torres  Date of Exam:   02/14/2024  Medical Rec #: [id number]              Accession #:    [id number]  Date of Birth: 1956/02/12               Patient Gender: M  Patient Age:   1 years  Exam Location[facility]  [facility]  Procedure:      VAS US  TEMPORAL ARTERY BILATERAL  Referring Phys: OSCAR ZELAYA    ---------------------------------------------------------------------------  -----    Indications: Shooting pain in left temple/forehead x 2 months. Patient  states              pressing against the site of most pain in the forehead is the  only              thing that stops the pain.   High Risk Factors: Age > 50 yrs.     Comparison Study: No prior study   Performing Technologist: Alberta Lis RVS     Examination Guidelines: Patient in reclined position. 2D, color and  spectral  doppler sampling in the temporal artery along the hairline and temple in  the  longitudinal plane. 2D images along the hairline and temple in the  transverse  plane. Exam is bilateral.     Dilated and tortuous proximal left temporal artery with thickened walls.  The proximal to mid portions of the artery do not fully compress. Summary:  Absence of a halo sign in the right temporal artery, although not  definitive, makes a diagnosis  of temporal arteritis unlikely. Presence of  a halo sign in the left temporal artery suggests temporal arteritis.         Electronically signed by Debby Robertson on 02/14/2024 at 4:51:42 PM.   Debby SAILOR. Robertson, MD FACS [facility] Phone Number: [phone] 02/15/2024 10:10 AM   Total time spent on preparing this encounter including chart review, data review, collecting history, examining the patient, and coordinating care: 30 min  Portions of this report may have been transcribed using voice recognition software.  Every effort has been made to ensure accuracy; however, inadvertent computerized transcription errors may still be present.

## 2024-02-15 NOTE — Progress Notes (Signed)
 Transition of Care [facility]) - Inpatient Brief Assessment   Patient Details  Name: Bill Torres MRN: [id number] Date of Birth: December 24, 1955  [facility] CM/SW Contact:    Rosaline JONELLE Joe, RN Phone Number: 02/15/2024, 12:11 PM   Clinical Narrative: Patient admitted from home with giant cell arteritis.  No IP Care management needs at this time.   Transition of Care Asessment: Insurance and Status: (P) Insurance coverage has been reviewed Patient has primary care physician: (P) Yes Home environment has been reviewed: (P) from home Prior level of function:: (P) self Prior/Current Home Services: (P) No current home services Social Drivers of Health Review: (P) SDOH reviewed no interventions necessary Readmission risk has been reviewed: (P) Yes Transition of care needs: (P) no transition of care needs at this time

## 2024-02-18 LAB — QUANTIFERON-TB GOLD PLUS (RQFGPL)
QuantiFERON Mitogen Value: >10 [IU]/mL
QuantiFERON Nil Value: 0.01 [IU]/mL
QuantiFERON TB1 Ag Value: 0.01 [IU]/mL
QuantiFERON TB2 Ag Value: 0.01 [IU]/mL

## 2024-02-18 LAB — QUANTIFERON-TB GOLD PLUS: QuantiFERON-TB Gold Plus: NEGATIVE

## 2024-02-18 NOTE — Progress Notes (Deleted)
 Office Visit Note  Patient: Bill Torres             Date of Birth: 01/03/1956           MRN: [id number]             PCP: Delbert Clam, MD Referring: Shawn Sick, MD Visit Date: 02/21/2024 Occupation: Data Unavailable  Subjective:  No chief complaint on file.   History of Present Illness: Bill Torres is a 68 y.o. male ***     Activities of Daily Living:  Patient reports morning stiffness for *** {minute/hour:19697}.   Patient {ACTIONS;DENIES/REPORTS:21021675::Denies} nocturnal pain.  Difficulty dressing/grooming: {ACTIONS;DENIES/REPORTS:21021675::Denies} Difficulty climbing stairs: {ACTIONS;DENIES/REPORTS:21021675::Denies} Difficulty getting out of chair: {ACTIONS;DENIES/REPORTS:21021675::Denies} Difficulty using hands for taps, buttons, cutlery, and/or writing: {ACTIONS;DENIES/REPORTS:21021675::Denies}  No Rheumatology ROS completed.   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) 12/14/2019   Polycythemia 12/14/2019   Pyuria 12/14/2019   Hematuria 12/14/2019   Elevated brain natriuretic peptide (BNP) level 12/14/2019   Dysgeusia 12/14/2019   Anosmia 12/14/2019   Morbid obesity (HCC) 12/14/2019   Mild concentric left ventricular hypertrophy (LVH) 12/14/2019   DKA (diabetic ketoacidosis) (HCC) 12/14/2019   Diabetic acidosis without coma (HCC) 12/13/2019   Diabetes mellitus (HCC)     Past Medical History:  Diagnosis Date   Cataract    Dental abscess  12/15/2019   Hyperlipidemia associated with type 2 diabetes mellitus (HCC) 12/14/2019    Family History  Problem Relation Age of Onset   Colon cancer Neg Hx    Esophageal cancer Neg Hx    Rectal cancer Neg Hx    Stomach cancer Neg Hx    Colon polyps Neg Hx    Past Surgical History:  Procedure Laterality Date   WISDOM TOOTH EXTRACTION     Social History   Tobacco Use   Smoking status: Never   Smokeless tobacco: Never  Vaping Use   Vaping status: Never Used  Substance Use Topics   Alcohol use: Never   Drug use: Never   Social History   Social History Narrative   Not on file     Immunization History  Administered Date(s) Administered   Moderna Sars-Covid-2 Vaccination 12/17/2019, 07/20/2020     Objective: Vital Signs: There were no vitals taken for this visit.   Physical Exam   Musculoskeletal Exam: ***  CDAI Exam: CDAI Score: -- Patient Global: --; Provider Global: -- Swollen: --; Tender: -- Joint Exam 02/21/2024   No joint exam has been documented for this visit   There is currently no information documented on the homunculus. Go to the Rheumatology activity and complete the homunculus joint exam.  Investigation: No additional findings.  Imaging: CT HEAD WO CONTRAST ( ) Result Date: 02/14/2024 CLINICAL DATA:  Hit head with large indentation on left parietal bone EXAM: CT HEAD WITHOUT CONTRAST TECHNIQUE: Contiguous axial images were obtained from the base of the skull through the vertex without intravenous contrast. RADIATION DOSE REDUCTION: This exam was performed according to the departmental dose-optimization program which  includes automated exposure control, adjustment of the mA and/or kV according to patient size and/or use of iterative reconstruction technique. COMPARISON:  None Available. FINDINGS: Brain: No acute territorial infarction, hemorrhage, or intracranial mass. Atrophy and mild chronic small vessel ischemic changes of the white matter.  Nonenlarged ventricles Vascular: No hyperdense vessels.  No unexpected calcification Skull: Normal. Negative for fracture or focal lesion. Sinuses/Orbits: No acute finding. Other: None IMPRESSION: 1. No CT evidence for acute intracranial abnormality. 2. Atrophy and mild chronic small vessel ischemic changes of the white matter. Electronically Signed   By: Luke Bun M.D.   On: 02/14/2024 17:48   TEMPORAL ARTERY Result Date: 02/14/2024  TEMPORAL ARTERY REPORT Patient Name:  Bill Torres  Date of Exam:   02/14/2024 Medical Rec #: [id number]              Accession #:    [id number] Date of Birth: 22-Aug-1955               Patient Gender: M Patient Age:   70 years Exam Location:  [facility] Procedure:      VAS US  TEMPORAL ARTERY BILATERAL Referring Phys: OSCAR ZELAYA --------------------------------------------------------------------------------  Indications: Shooting pain in left temple/forehead x 2 months. Patient states              pressing against the site of most pain in the forehead is the only              thing that stops the pain. High Risk Factors: Age > 50 yrs.  Comparison Study: No prior study Performing Technologist: Alberta Lis RVS  Examination Guidelines: Patient in reclined position. 2D, color and spectral doppler sampling in the temporal artery along the hairline and temple in the longitudinal plane. 2D images along the hairline and temple in the transverse plane. Exam is bilateral.  Dilated and tortuous proximal left temporal artery with thickened walls. The proximal to mid portions of the artery do not fully compress. Summary: Absence of a halo sign in the right temporal artery, although not definitive, makes a diagnosis of temporal arteritis unlikely. 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   Electronically signed by Debby Robertson on 02/14/2024 at 4:51:42 PM.   Final     Recent Labs: Lab Results  Component Value Date   WBC  7.9 02/14/2024   HGB 15.6 02/14/2024   PLT 289 02/14/2024   NA 136 02/15/2024   K 4.3 02/15/2024   CL 103 02/15/2024   CO2 24 02/15/2024   GLUCOSE 170 (H) 02/15/2024   BUN 10 02/15/2024   CREATININE 0.93 02/15/2024   BILITOT 1.1 02/14/2024   ALKPHOS 149 (H) 02/14/2024   AST 25 02/14/2024   ALT 31 02/14/2024   PROT 7.7 02/14/2024   ALBUMIN 3.7 02/14/2024   CALCIUM  9.1 02/15/2024   GFRAA 97 06/16/2020   QFTBGOLDPLUS Negative 02/14/2024   Sep 05, 2023 lipid panel HDL 38 February 14, 2024 TB Gold  Speciality Comments: No specialty comments available.  Procedures:  No procedures performed Allergies: Patient has no known allergies.   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  No orders of the defined types were placed in this encounter.   Face-to-face time spent with patient was *** minutes. Greater than 50% of time was spent in counseling and coordination of care.  Follow-Up Instructions: No follow-ups on file.   Maya Nash, MD  Note - This record has been created using Animal nutritionist.  Chart creation errors have been sought, but may not always  have been located. Such creation errors do not reflect on  the standard of medical care.

## 2024-02-19 ENCOUNTER — Ambulatory Visit: Payer: Self-pay | Admitting: Family Medicine

## 2024-02-21 ENCOUNTER — Ambulatory Visit: Attending: Rheumatology | Admitting: Rheumatology

## 2024-02-21 DIAGNOSIS — E1129 Type 2 diabetes mellitus with other diabetic kidney complication: Secondary | ICD-10-CM

## 2024-02-21 DIAGNOSIS — Z79899 Other long term (current) drug therapy: Secondary | ICD-10-CM

## 2024-02-21 DIAGNOSIS — N200 Calculus of kidney: Secondary | ICD-10-CM

## 2024-02-21 DIAGNOSIS — E1110 Type 2 diabetes mellitus with ketoacidosis without coma: Secondary | ICD-10-CM

## 2024-02-21 DIAGNOSIS — E1165 Type 2 diabetes mellitus with hyperglycemia: Secondary | ICD-10-CM

## 2024-02-21 DIAGNOSIS — D751 Secondary polycythemia: Secondary | ICD-10-CM

## 2024-02-21 DIAGNOSIS — I517 Cardiomegaly: Secondary | ICD-10-CM

## 2024-02-21 DIAGNOSIS — M316 Other giant cell arteritis: Secondary | ICD-10-CM

## 2024-02-21 DIAGNOSIS — E1149 Type 2 diabetes mellitus with other diabetic neurological complication: Secondary | ICD-10-CM

## 2024-02-21 DIAGNOSIS — E1169 Type 2 diabetes mellitus with other specified complication: Secondary | ICD-10-CM

## 2024-02-21 NOTE — Progress Notes (Deleted)
 Office Visit Note  Patient: Bill Torres             Date of Birth: 10/14/1955           MRN: [id number]             PCP: Delbert Clam, MD Referring: Delbert Clam, MD Visit Date: 02/27/2024 Occupation: Data Unavailable  Subjective:  No chief complaint on file.   History of Present Illness: Bill Torres is a 68 y.o. male *** Patient was seen in the emergency room on February 14, 2024 with left temporal region pain.  According to the ER visit note patient was having left-sided headache for 2-1/2 months described as sharp needlelike sensation in the left temple.  The pain was radiating to the back of his eye and left posterior head.  He was given the diagnosis of temporal arteritis based on ultrasound examination.  Is performed by vascular lab).  According to the ED note a halo sign was noted in the left temporal artery and absent in the right temporal artery.  He was started on prednisone  60 mg daily for 7 days with 10 mg/week taper.  He was also placed on Bactrim prophylaxis and Protonix.  CT head obtained on February 14, 2024 showed atrophy and mild chronic small vessel ischemic changes of the white matter.    Activities of Daily Living:  Patient reports morning stiffness for *** {minute/hour:19697}.   Patient {ACTIONS;DENIES/REPORTS:21021675::Denies} nocturnal pain.  Difficulty dressing/grooming: {ACTIONS;DENIES/REPORTS:21021675::Denies} Difficulty climbing stairs: {ACTIONS;DENIES/REPORTS:21021675::Denies} Difficulty getting out of chair: {ACTIONS;DENIES/REPORTS:21021675::Denies} Difficulty using hands for taps, buttons, cutlery, and/or writing: {ACTIONS;DENIES/REPORTS:21021675::Denies}  No Rheumatology ROS completed.   PMFS History:  Patient Active Problem List   Diagnosis Date Noted   Giant cell arteritis (HCC) 02/14/2024   Fall 02/14/2024   Right nephrolithiasis 03/01/2021   Diabetic neuropathy (HCC) 03/01/2021   Type 2 diabetes mellitus with  hyperglycemia (HCC) 01/28/2020   Hyperglycemia    Groin swelling    Dental abscess 12/15/2019   Acute kidney injury 12/14/2019   Dehydration with hypernatremia 12/14/2019   Hyperlipidemia associated with type 2 diabetes mellitus (HCC) 12/14/2019   Proteinuria due to type 2 diabetes mellitus (HCC) 12/14/2019   Polycythemia 12/14/2019   Pyuria 12/14/2019   Hematuria 12/14/2019   Elevated brain natriuretic peptide (BNP) level 12/14/2019   Dysgeusia 12/14/2019   Anosmia 12/14/2019   Morbid obesity (HCC) 12/14/2019   Mild concentric left ventricular hypertrophy (LVH) 12/14/2019   DKA (diabetic ketoacidosis) (HCC) 12/14/2019   Diabetic acidosis without coma (HCC) 12/13/2019   Diabetes mellitus (HCC)     Past Medical History:  Diagnosis Date   Cataract    Dental abscess 12/15/2019   Hyperlipidemia associated with type 2 diabetes mellitus (HCC) 12/14/2019    Family History  Problem Relation Age of Onset   Colon cancer Neg Hx    Esophageal cancer Neg Hx    Rectal cancer Neg Hx    Stomach cancer Neg Hx    Colon polyps Neg Hx    Past Surgical History:  Procedure Laterality Date   WISDOM TOOTH EXTRACTION     Social History   Tobacco Use   Smoking status: Never   Smokeless tobacco: Never  Vaping Use   Vaping status: Never Used  Substance Use Topics   Alcohol use: Never   Drug use: Never   Social History   Social History Narrative   Not on file     Immunization History  Administered Date(s) Administered   Moderna Sars-Covid-2  Vaccination 12/17/2019, 07/20/2020     Objective: Vital Signs: There were no vitals taken for this visit.   Physical Exam   Musculoskeletal Exam: ***  CDAI Exam: CDAI Score: -- Patient Global: --; Provider Global: -- Swollen: --; Tender: -- Joint Exam 02/27/2024   No joint exam has been documented for this visit   There is currently no information documented on the homunculus. Go to the Rheumatology activity and complete the  homunculus joint exam.  Investigation: No additional findings.  Imaging: CT HEAD WO CONTRAST ( ) Result Date: 02/14/2024 CLINICAL DATA:  Hit head with large indentation on left parietal bone EXAM: CT HEAD WITHOUT CONTRAST TECHNIQUE: Contiguous axial images were obtained from the base of the skull through the vertex without intravenous contrast. RADIATION DOSE REDUCTION: This exam was performed according to the departmental dose-optimization program which includes automated exposure control, adjustment of the mA and/or kV according to patient size and/or use of iterative reconstruction technique. COMPARISON:  None Available. FINDINGS: Brain: No acute territorial infarction, hemorrhage, or intracranial mass. Atrophy and mild chronic small vessel ischemic changes of the white matter. Nonenlarged ventricles Vascular: No hyperdense vessels.  No unexpected calcification Skull: Normal. Negative for fracture or focal lesion. Sinuses/Orbits: No acute finding. Other: None IMPRESSION: 1. No CT evidence for acute intracranial abnormality. 2. Atrophy and mild chronic small vessel ischemic changes of the white matter. Electronically Signed   By: Luke Bun M.D.   On: 02/14/2024 17:48   TEMPORAL ARTERY Result Date: 02/14/2024  TEMPORAL ARTERY REPORT Patient Name:  Bill Torres  Date of Exam:   02/14/2024 Medical Rec #: [id number]              Accession #:    [id number] Date of Birth: 04/16/1956               Patient Gender: M Patient Age:   73 years Exam Location[facility]  [facility] Procedure:      VAS US  TEMPORAL ARTERY BILATERAL Referring Phys: OSCAR ZELAYA --------------------------------------------------------------------------------  Indications: Shooting pain in left temple/forehead x 2 months. Patient states              pressing against the site of most pain in the forehead is the only              thing that stops the pain. High Risk Factors: Age > 50 yrs.  Comparison Study: No prior study  Performing Technologist: Alberta Lis RVS  Examination Guidelines: Patient in reclined position. 2D, color and spectral doppler sampling in the temporal artery along the hairline and temple in the longitudinal plane. 2D images along the hairline and temple in the transverse plane. Exam is bilateral.  Dilated and tortuous proximal left temporal artery with thickened walls. The proximal to mid portions of the artery do not fully compress. Summary: Absence of a halo sign in the right temporal artery, although not definitive, makes a diagnosis of temporal arteritis unlikely. 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   Electronically signed by Debby Robertson on 02/14/2024 at 4:51:42 PM.   Final     Recent Labs: Lab Results  Component Value Date   WBC 7.9 02/14/2024   HGB 15.6 02/14/2024   PLT 289 02/14/2024   NA 136 02/15/2024   K 4.3 02/15/2024   CL 103 02/15/2024   CO2 24 02/15/2024   GLUCOSE 170 (H) 02/15/2024   BUN 10 02/15/2024   CREATININE 0.93 02/15/2024   BILITOT 1.1  02/14/2024   ALKPHOS 149 (H) 02/14/2024   AST 25 02/14/2024   ALT 31 02/14/2024   PROT 7.7 02/14/2024   ALBUMIN 3.7 02/14/2024   CALCIUM  9.1 02/15/2024   GFRAA 97 06/16/2020   QFTBGOLDPLUS Negative 02/14/2024   August 16, 2023 hemoglobin A1c 6.0 Sep 05, 2023 LDL 76, HDL 38 February 14, 2024 TB Gold negative, hepatitis A, hepatitis B and hepatitis C nonreactive, HIV nonreactive, sed rate 19, CRP 1.1   Speciality Comments: No specialty comments available.  Procedures:  No procedures performed Allergies: Patient has no known allergies.   Assessment / Plan:     Visit Diagnoses: No diagnosis found.  Orders: No orders of the defined types were placed in this encounter.  No orders of the defined types were placed in this encounter.   Face-to-face time spent with patient was *** minutes. Greater than 50% of time was spent in counseling and coordination of  care.  Follow-Up Instructions: No follow-ups on file.   Maya Nash, MD  Note - This record has been created using Animal nutritionist.  Chart creation errors have been sought, but may not always  have been located. Such creation errors do not reflect on  the standard of medical care.

## 2024-02-27 ENCOUNTER — Ambulatory Visit: Attending: Rheumatology | Admitting: Rheumatology

## 2024-02-27 DIAGNOSIS — D751 Secondary polycythemia: Secondary | ICD-10-CM

## 2024-02-27 DIAGNOSIS — Z79899 Other long term (current) drug therapy: Secondary | ICD-10-CM

## 2024-02-27 DIAGNOSIS — E1169 Type 2 diabetes mellitus with other specified complication: Secondary | ICD-10-CM

## 2024-02-27 DIAGNOSIS — I517 Cardiomegaly: Secondary | ICD-10-CM

## 2024-02-27 DIAGNOSIS — E1129 Type 2 diabetes mellitus with other diabetic kidney complication: Secondary | ICD-10-CM

## 2024-02-27 DIAGNOSIS — E1110 Type 2 diabetes mellitus with ketoacidosis without coma: Secondary | ICD-10-CM

## 2024-02-27 DIAGNOSIS — M316 Other giant cell arteritis: Secondary | ICD-10-CM

## 2024-02-27 DIAGNOSIS — N200 Calculus of kidney: Secondary | ICD-10-CM

## 2024-02-29 ENCOUNTER — Encounter: Admitting: Family

## 2024-02-29 ENCOUNTER — Other Ambulatory Visit: Payer: Self-pay

## 2024-02-29 NOTE — Progress Notes (Signed)
 Erroneous encounter-disregard

## 2024-03-10 ENCOUNTER — Ambulatory Visit: Admitting: Family Medicine

## 2024-03-11 ENCOUNTER — Ambulatory Visit: Admitting: Nurse Practitioner

## 2024-04-14 ENCOUNTER — Other Ambulatory Visit: Payer: Self-pay

## 2024-04-15 ENCOUNTER — Other Ambulatory Visit: Payer: Self-pay

## 2024-04-15 ENCOUNTER — Other Ambulatory Visit (HOSPITAL_COMMUNITY): Payer: Self-pay

## 2024-04-15 ENCOUNTER — Other Ambulatory Visit: Payer: Self-pay | Admitting: Family Medicine

## 2024-04-15 DIAGNOSIS — Z7985 Long-term (current) use of injectable non-insulin antidiabetic drugs: Secondary | ICD-10-CM

## 2024-04-15 DIAGNOSIS — E1169 Type 2 diabetes mellitus with other specified complication: Secondary | ICD-10-CM

## 2024-04-15 MED ORDER — OZEMPIC (1 MG/DOSE) 4 MG/3ML ~~LOC~~ SOPN
1.0000 mg | PEN_INJECTOR | SUBCUTANEOUS | 0 refills | Status: DC
  Filled 2024-04-15 (×2): qty 3, 28d supply, fill #0

## 2024-04-15 NOTE — Telephone Encounter (Signed)
 Copied from CRM #[id number]. Topic: Clinical - Medication Refill >> Apr 15, 2024  8:26 AM Mesmerise C wrote: Medication:  Semaglutide , 1 MG/DOSE, (OZEMPIC , 1 MG/DOSE,) 4 MG/3ML SOPN    Has the patient contacted their pharmacy? Yes (Agent: If no, request that the patient contact the pharmacy for the refill. If patient does not wish to contact the pharmacy document the reason why and proceed with request.) (Agent: If yes, when and what did the pharmacy advise?) Was told to contact doctor's office This is the patient's preferred pharmacy:  [facility] [address], [address] [place] [address] Phone: [phone] Fax: [phone]  Is this the correct pharmacy for this prescription? Yes If no, delete pharmacy and type the correct one.   Has the prescription been filled recently? No  Is the patient out of the medication? Yes  Has the patient been seen for an appointment in the last year OR does the patient have an upcoming appointment? Yes  Can we respond through MyChart? Yes  Agent: Please be advised that Rx refills may take up to 3 business days. We ask that you follow-up with your pharmacy.

## 2024-04-16 ENCOUNTER — Other Ambulatory Visit: Payer: Self-pay

## 2024-04-16 NOTE — Telephone Encounter (Signed)
 Requested medication (s) are due for refill today: na   Requested medication (s) are on the active medication list: yes   Last refill:  04/15/24 #3 ml 0 refills   Future visit scheduled: yes 05/06/24  Notes to clinic:  last ordered by Bill Morrison, MD. Do you want to order Rx?     Requested Prescriptions  Pending Prescriptions Disp Refills   Semaglutide , 1 MG/DOSE, (OZEMPIC , 1 MG/DOSE,) 4 MG/3ML SOPN      Sig: Inject 1 mg as directed once a week. Monday     Endocrinology:  Diabetes - GLP-1 Receptor Agonists - semaglutide  Failed - 04/16/2024  2:46 PM      Failed - HBA1C in normal range and within 180 days    HbA1c, POC (controlled diabetic range)  Date Value Ref Range Status  08/16/2023 6.0 0.0 - 7.0 % Final         Failed - Valid encounter within last 6 months    Recent Outpatient Visits           7 months ago Long-term current use of injectable noninsulin antidiabetic medication   [facility] [place] Delbert Clam, MD   8 months ago Type 2 diabetes mellitus with hyperglycemia, unspecified whether long term insulin  use [facility])   [facility] [place]. [facility] [place], [place]   1 year ago Type 2 diabetes mellitus with other specified complication, with long-term current use of insulin  [facility])   [facility] [place]. [facility] Delbert Clam, MD   1 year ago Facial pain   [facility] [place]. [facility] Delbert Clam, MD   2 years ago Type 2 diabetes mellitus with other specified complication, with long-term current use of insulin  [facility])   [facility] [place]. [facility] [place], Clam, MD              Passed - Cr in normal range and within 360 days    Creatinine, Ser  Date Value Ref Range Status  02/15/2024 0.93 0.61 - 1.24  mg/dL Final

## 2024-04-18 NOTE — Telephone Encounter (Signed)
 Called & spoke to the patient. Verified name & DOB. Informed that an Ozempic  refill was sent 04/15/2024 with a 28 day supply sent. Informed that he has an upcoming appointment on 05/07/23 and will be able to request additional refills at that time. Patient confirmed visit and expressed verbal understanding of all discussed.

## 2024-05-06 ENCOUNTER — Other Ambulatory Visit: Payer: Self-pay

## 2024-05-06 ENCOUNTER — Ambulatory Visit: Attending: Family Medicine | Admitting: Family Medicine

## 2024-05-06 ENCOUNTER — Encounter: Payer: Self-pay | Admitting: Family Medicine

## 2024-05-06 VITALS — BP 124/84 | HR 107 | Temp 98.4°F | Ht 74.0 in | Wt 270.8 lb

## 2024-05-06 DIAGNOSIS — N401 Enlarged prostate with lower urinary tract symptoms: Secondary | ICD-10-CM

## 2024-05-06 DIAGNOSIS — Z7985 Long-term (current) use of injectable non-insulin antidiabetic drugs: Secondary | ICD-10-CM | POA: Diagnosis not present

## 2024-05-06 DIAGNOSIS — E1169 Type 2 diabetes mellitus with other specified complication: Secondary | ICD-10-CM | POA: Diagnosis not present

## 2024-05-06 DIAGNOSIS — Z7984 Long term (current) use of oral hypoglycemic drugs: Secondary | ICD-10-CM | POA: Diagnosis not present

## 2024-05-06 DIAGNOSIS — N528 Other male erectile dysfunction: Secondary | ICD-10-CM | POA: Diagnosis not present

## 2024-05-06 DIAGNOSIS — E785 Hyperlipidemia, unspecified: Secondary | ICD-10-CM | POA: Diagnosis not present

## 2024-05-06 DIAGNOSIS — E1149 Type 2 diabetes mellitus with other diabetic neurological complication: Secondary | ICD-10-CM | POA: Diagnosis not present

## 2024-05-06 DIAGNOSIS — Z794 Long term (current) use of insulin: Secondary | ICD-10-CM | POA: Diagnosis not present

## 2024-05-06 LAB — POCT GLYCOSYLATED HEMOGLOBIN (HGB A1C): HbA1c, POC (controlled diabetic range): 6.7 % (ref 0.0–7.0)

## 2024-05-06 MED ORDER — TAMSULOSIN HCL 0.4 MG PO CAPS
0.4000 mg | ORAL_CAPSULE | Freq: Every day | ORAL | 1 refills | Status: AC
  Filled 2024-05-06 (×2): qty 90, 90d supply, fill #0

## 2024-05-06 MED ORDER — EMPAGLIFLOZIN 10 MG PO TABS
10.0000 mg | ORAL_TABLET | Freq: Every day | ORAL | 1 refills | Status: AC
  Filled 2024-05-06: qty 90, 90d supply, fill #0

## 2024-05-06 MED ORDER — ROSUVASTATIN CALCIUM 40 MG PO TABS
40.0000 mg | ORAL_TABLET | Freq: Every day | ORAL | 1 refills | Status: AC
  Filled 2024-05-06: qty 90, 90d supply, fill #0

## 2024-05-06 MED ORDER — DULOXETINE HCL 60 MG PO CPEP
60.0000 mg | ORAL_CAPSULE | Freq: Every day | ORAL | 1 refills | Status: AC
  Filled 2024-05-06: qty 90, 90d supply, fill #0

## 2024-05-06 MED ORDER — OZEMPIC (1 MG/DOSE) 4 MG/3ML ~~LOC~~ SOPN
1.0000 mg | PEN_INJECTOR | SUBCUTANEOUS | 1 refills | Status: AC
  Filled 2024-05-06: qty 9, 84d supply, fill #0

## 2024-05-06 MED ORDER — LANTUS SOLOSTAR 100 UNIT/ML ~~LOC~~ SOPN
36.0000 [IU] | PEN_INJECTOR | Freq: Every day | SUBCUTANEOUS | 6 refills | Status: AC
  Filled 2024-05-06: qty 30, 83d supply, fill #0

## 2024-05-06 NOTE — Patient Instructions (Signed)
 VISIT SUMMARY:  Bill Torres, a 68 year old male, came in for a follow-up visit. His diabetes is well controlled with an A1c of 6.7%, and he continues to manage his diabetic neuropathy with Cymbalta . He has ongoing issues with erectile dysfunction and benign prostatic hyperplasia, for which he is awaiting a urology evaluation. He also has a history of glaucoma and acid reflux, both of which are currently not being actively treated. He engages in regular walking for exercise.  YOUR PLAN:  -TYPE 2 DIABETES MELLITUS WITH DIABETIC NEUROPATHY: Your diabetes is well controlled with an A1c of 6.7%. You should continue your current medications: Lantus  36 units at bedtime, Ozempic  1 mg weekly, and Jardiance  10 mg daily. For the numbness in your hands and feet, continue taking Cymbalta  as it helps manage these symptoms.  -BENIGN PROSTATIC HYPERPLASIA WITH LOWER URINARY TRACT SYMPTOMS: Benign prostatic hyperplasia (BPH) is an enlarged prostate gland that can cause urinary problems. Since tamsulosin  has been ineffective, you have been referred to a urologist for further evaluation and management of BPH and erectile dysfunction.  -ERECTILE DYSFUNCTION: Erectile dysfunction is the inability to achieve or maintain an erection. Since Viagra  was not effective and you have concerns about its heart-related side effects, you have been referred to a urologist for further evaluation.  -HYPERLIPIDEMIA: Hyperlipidemia is having high levels of cholesterol in the blood. Your cholesterol levels were last checked in May, and no immediate retesting is needed. A fasting cholesterol test will be scheduled at your next visit.  -GENERAL HEALTH MAINTENANCE: You are doing well with your regular walking exercise. Please ensure you complete an annual eye exam and continue your exercise regimen.  INSTRUCTIONS:  Please follow up with the urologist for further evaluation of your benign prostatic hyperplasia and erectile  dysfunction. Schedule a fasting cholesterol test at your next visit. Ensure you complete an annual eye exam.

## 2024-05-06 NOTE — Progress Notes (Signed)
 "  Subjective:  Patient ID: Bill Torres, male    DOB: Dec 15, 1955  Age: 69 y.o. MRN: [id number]  CC: Hospitalization Follow-up     Discussed the use of AI scribe software for clinical note transcription with the patient, who gave verbal consent to proceed.  History of Present Illness Bill Torres is a 69 year old male with a history of obesity, type 2 diabetes mellitus (diagnosed in 11/2019), hyperlipidemia, BPH who presents for a follow-up visit.  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  He was supposed to follow-up with rheumatology but never did.  His diabetes is well controlled with a recent A1c of 6.7%. He uses Lantus  36 units at bedtime, Ozempic  1 mg weekly, and Jardiance  10 mg daily and feels this regimen works well.  He has numbness in his hands and feet and takes Cymbalta , which he feels helps.  He has erectile dysfunction. Tamsulosin  is ineffective, and Viagra  did not help and was stopped due to his concern about possible heart side effects.  He stopped pantoprazole  that he had used for acid reflux. He walks regularly for exercise.    Past Medical History:  Diagnosis Date   Cataract    Dental abscess 12/15/2019   Hyperlipidemia associated with type 2 diabetes mellitus (HCC) 12/14/2019    Past Surgical History:  Procedure Laterality Date   WISDOM TOOTH EXTRACTION      Family History  Problem Relation Age of Onset   Colon cancer Neg Hx    Esophageal cancer Neg Hx    Rectal cancer Neg Hx    Stomach cancer Neg Hx    Colon polyps Neg Hx     Social History   Socioeconomic History   Marital status: Single    Spouse name: Not on file   Number of children: Not on file   Years of education: Not on file   Highest education level: Not on file  Occupational History   Not on file  Tobacco Use   Smoking status: Never   Smokeless tobacco:  Never  Vaping Use   Vaping status: Never Used  Substance and Sexual Activity   Alcohol use: Never   Drug use: Never   Sexual activity: Not on file  Other Topics Concern   Not on file  Social History Narrative   Not on file   Social Drivers of Health   Tobacco Use: Low Risk (05/06/2024)   Patient History    Smoking Tobacco Use: Never    Smokeless Tobacco Use: Never    Passive Exposure: Not on file  Financial Resource Strain: Low Risk (06/05/2022)   Overall Financial Resource Strain (CARDIA)    Difficulty of Paying Living Expenses: Not hard at all  Food Insecurity: No Food Insecurity (02/14/2024)   Epic    Worried About Radiation Protection Practitioner of Food in the Last Year: Never true    Ran Out of Food in the Last Year: Never true  Transportation Needs: No Transportation Needs (02/14/2024)   Epic    Lack of Transportation (Medical): No    Lack of Transportation (Non-Medical): No  Physical Activity: Sufficiently Active (06/05/2022)   Exercise Vital Sign    Days of Exercise per Week: 7 days    Minutes of Exercise per Session: 30 min  Stress: No Stress Concern Present (06/05/2022)   [name]  Feeling of Stress : Not at all  Social Connections: Moderately Isolated (02/14/2024)   Social Connection and Isolation Panel    Frequency of Communication with Friends and Family: Three times a week    Frequency of Social Gatherings with Friends and Family: Three times a week    Attends Religious Services: More than 4 times per year    Active Member of Clubs or Organizations: No    Attends Banker Meetings: Never    Marital Status: Divorced  Depression (PHQ2-9): Low Risk (09/05/2023)   Depression (PHQ2-9)    PHQ-2 Score: 0  Alcohol Screen: Low Risk (06/05/2022)   Alcohol Screen    Last Alcohol Screening Score (AUDIT): 0  Housing: Unknown (02/14/2024)   Epic    Unable to Pay for Housing in the Last Year: Not on file     Number of Times Moved in the Last Year: 0    Homeless in the Last Year: Not on file  Utilities: Not At Risk (02/14/2024)   Epic    Threatened with loss of utilities: No  Health Literacy: Not on file    Allergies[1]  Outpatient Medications Prior to Visit  Medication Sig Dispense Refill   Accu-Chek Softclix Lancets lancets Use to check blood sugar three times a day 100 each 2   ascorbic acid (VITAMIN C) 500 MG tablet Take 500 mg by mouth daily.     cholecalciferol (VITAMIN D3) 25 MCG (1000 UNIT) tablet Take 1,000 Units by mouth daily.     Continuous Glucose Sensor (DEXCOM G7 SENSOR) MISC Change sensor every 10 days 3 each 0   cyanocobalamin (VITAMIN B12) 100 MCG tablet Take 100 mcg by mouth daily.     glucose blood test strip USE TO TEST BLOOD SUGAR THREE TIMES A DAY 100 strip 2   Insulin  Pen Needle (TRUEPLUS 5-BEVEL PEN NEEDLES) 32G X 4 MM MISC Use to inject Lantus  and Ozempic .Must keep upcoming appointment for more refills. 100 each 2   sildenafil  (VIAGRA ) 100 MG tablet Take 1 tablet (100 mg total) by mouth daily as needed for erectile dysfunction. 10 tablet 2   vitamin E 180 MG (400 UNITS) capsule Take 400 Units by mouth daily.     DULoxetine  (CYMBALTA ) 60 MG capsule Take 1 capsule (60 mg total) by mouth daily. For neuropathy. 90 capsule 1   empagliflozin  (JARDIANCE ) 10 MG TABS tablet Take 1 tablet (10 mg total) by mouth daily. 90 tablet 1   insulin  glargine (LANTUS  SOLOSTAR) 100 UNIT/ML Solostar Pen Inject 36 Units into the skin at bedtime. 30 mL 6   latanoprost (XALATAN) 0.005 % ophthalmic solution Place 1 drop into both eyes at bedtime.     pantoprazole  (PROTONIX ) 40 MG tablet Take 1 tablet (40 mg total) by mouth daily. 30 tablet 0   rosuvastatin  (CRESTOR ) 40 MG tablet Take 1 tablet (40 mg total) by mouth daily. 90 tablet 1   Semaglutide , 1 MG/DOSE, (OZEMPIC , 1 MG/DOSE,) 4 MG/3ML SOPN Inject 1 mg as directed once a week. 3 mL 0   sulfamethoxazole -trimethoprim  (BACTRIM ) 400-80 MG  tablet Take 1 tablet by mouth daily. 30 tablet 0   tamsulosin  (FLOMAX ) 0.4 MG CAPS capsule Take 1 capsule (0.4 mg total) by mouth daily. 90 capsule 1   No facility-administered medications prior to visit.     ROS Review of Systems  Constitutional:  Negative for activity change and appetite change.  HENT:  Negative for sinus pressure and sore throat.   Respiratory:  Negative for chest  tightness, shortness of breath and wheezing.   Cardiovascular:  Negative for chest pain and palpitations.  Gastrointestinal:  Negative for abdominal distention, abdominal pain and constipation.  Genitourinary: Negative.   Musculoskeletal: Negative.   Psychiatric/Behavioral:  Negative for behavioral problems and dysphoric mood.     Objective:  BP 124/84   Pulse (!) 107   Temp 98.4 F (36.9 C) (Oral)   Ht 6' 2 (1.88 m)   Wt 270 lb 12.8 oz (122.8 kg)   SpO2 97%   BMI 34.77 kg/m      05/06/2024    3:43 PM 02/15/2024   11:59 AM 02/15/2024    7:52 AM  BP/Weight  Systolic BP 124 117 104  Diastolic BP 84 70 70  Wt. (Lbs) 270.8    BMI 34.77 kg/m2        Physical Exam Constitutional:      Appearance: He is well-developed.  Cardiovascular:     Rate and Rhythm: Tachycardia present.     Heart sounds: Normal heart sounds. No murmur heard. Pulmonary:     Effort: Pulmonary effort is normal.     Breath sounds: Normal breath sounds. No wheezing or rales.  Chest:     Chest wall: No tenderness.  Abdominal:     General: Bowel sounds are normal. There is no distension.     Palpations: Abdomen is soft. There is no mass.     Tenderness: There is no abdominal tenderness.  Musculoskeletal:        General: Normal range of motion.     Right lower leg: No edema.     Left lower leg: No edema.  Neurological:     Mental Status: He is alert and oriented to person, place, and time.  Psychiatric:        Mood and Affect: Mood normal.        Latest Ref Rng & Units 02/15/2024    1:49 AM 02/14/2024    11:50 AM 07/12/2023    8:23 AM  CMP  Glucose 70 - 99 mg/dL 829  92  877   BUN 8 - 23 mg/dL 10  5  7    Creatinine 0.61 - 1.24 mg/dL 9.06  9.06  9.14   Sodium 135 - 145 mmol/L 136  142  138   Potassium 3.5 - 5.1 mmol/L 4.3  3.8  3.6   Chloride 98 - 111 mmol/L 103  107  102   CO2 22 - 32 mmol/L 24  25  25    Calcium  8.9 - 10.3 mg/dL 9.1  8.9  9.3   Total Protein 6.5 - 8.1 g/dL  7.7  8.6   Total Bilirubin 0.0 - 1.2 mg/dL  1.1  1.0   Alkaline Phos 38 - 126 U/L  149  135   AST 15 - 41 U/L  25  28   ALT 0 - 44 U/L  31  30     Lipid Panel     Component Value Date/Time   CHOL 132 09/05/2023 1146   TRIG 97 09/05/2023 1146   HDL 38 (L) 09/05/2023 1146   CHOLHDL 7.8 (H) 06/16/2020 1130   CHOLHDL 9.2 12/13/2019 1647   VLDL 60 (H) 12/13/2019 1647   LDLCALC 76 09/05/2023 1146    CBC    Component Value Date/Time   WBC 7.9 02/14/2024 1150   RBC 5.29 02/14/2024 1150   HGB 15.6 02/14/2024 1150   HGB 15.6 09/08/2022 0839   HCT 47.2 02/14/2024 1150   HCT 45.9  09/08/2022 0839   PLT 289 02/14/2024 1150   PLT 248 09/08/2022 0839   MCV 89.2 02/14/2024 1150   MCV 88 09/08/2022 0839   MCH 29.5 02/14/2024 1150   MCHC 33.1 02/14/2024 1150   RDW 13.3 02/14/2024 1150   RDW 13.3 09/08/2022 0839   LYMPHSABS 2.3 02/14/2024 1150   LYMPHSABS 2.5 09/08/2022 0839   MONOABS 0.6 02/14/2024 1150   EOSABS 0.3 02/14/2024 1150   EOSABS 0.1 09/08/2022 0839   BASOSABS 0.1 02/14/2024 1150   BASOSABS 0.0 09/08/2022 0839    Lab Results  Component Value Date   HGBA1C 6.7 05/06/2024       09/05/2023   11:11 AM 08/16/2023   11:13 AM 03/08/2023   10:21 AM 09/05/2022    8:51 AM 06/05/2022    9:12 AM  Depression screen PHQ 2/9  Decreased Interest 0 0 0 0 0  Down, Depressed, Hopeless 0 0 0 0 0  PHQ - 2 Score 0 0 0 0 0  Altered sleeping 0 0 0 0   Tired, decreased energy 0 0 0 0   Change in appetite 0 0 0 0   Feeling bad or failure about yourself  0 0 0 0   Trouble concentrating 0 0 0 0   Moving slowly  or fidgety/restless 0 0 0 0   Suicidal thoughts 0 0 0 0   PHQ-9 Score 0  0  0  0    Difficult doing work/chores Not difficult at all Not difficult at all        Data saved with a previous flowsheet row definition       Assessment & Plan Type 2 diabetes mellitus complicated by diabetic neuropathy Diabetes well-controlled, A1c 6.7. Neuropathy symptoms persist. - Continue Lantus  36 units at bedtime, Ozempic  1 mg weekly, Jardiance  10 mg daily. - Continue Cymbalta  for neuropathy management. -Counseled on Diabetic diet, the healthy plate, 849 minutes of moderate intensity exercise/week Blood sugar logs with fasting goals of 80-120 mg/dl, random of less than 819 and in the event of sugars less than 60 mg/dl or greater than 599 mg/dl encouraged to notify the clinic. Advised on the need for annual eye exams, annual foot exams, Pneumonia vaccine.  Benign prostatic hyperplasia with lower urinary tract symptoms Current tamsulosin  ineffective.  Offered to increase tamsulosin  from 0.4 to 0.8 mg but he declines and is awaiting urology evaluation. - Referred to urologist for further evaluation of BPH and erectile dysfunction.  Erectile dysfunction Persists despite Viagra  use. Concern about cardiovascular risks. - Referred to urologist for evaluation of erectile dysfunction.  Hyperlipidemia associated with type 2 diabetes mellitus Cholesterol levels last checked in May. No immediate retesting needed. - Schedule fasting cholesterol test at next visit.  General Health Maintenance Engages in regular walking exercise. Last eye exam date unknown. - Ensure annual eye exam is completed. - Continue regular exercise regimen.      Meds ordered this encounter  Medications   DULoxetine  (CYMBALTA ) 60 MG capsule    Sig: Take 1 capsule (60 mg total) by mouth daily. For neuropathy.    Dispense:  90 capsule    Refill:  1   insulin  glargine (LANTUS  SOLOSTAR) 100 UNIT/ML Solostar Pen    Sig: Inject 36  Units into the skin at bedtime.    Dispense:  30 mL    Refill:  6   rosuvastatin  (CRESTOR ) 40 MG tablet    Sig: Take 1 tablet (40 mg total) by mouth daily.  Dispense:  90 tablet    Refill:  1   Semaglutide , 1 MG/DOSE, (OZEMPIC , 1 MG/DOSE,) 4 MG/3ML SOPN    Sig: Inject 1 mg as directed once a week.    Dispense:  9 mL    Refill:  1   tamsulosin  (FLOMAX ) 0.4 MG CAPS capsule    Sig: Take 1 capsule (0.4 mg total) by mouth daily.    Dispense:  90 capsule    Refill:  1   empagliflozin  (JARDIANCE ) 10 MG TABS tablet    Sig: Take 1 tablet (10 mg total) by mouth daily.    Dispense:  90 tablet    Refill:  1    Follow-up: Return in about 6 months (around 11/03/2024) for Chronic medical conditions.       Corrina Sabin, MD, FAAFP. [facility] [place], [place] [phone]   05/06/2024, 4:18 PM    [1] No Known Allergies  "

## 2024-11-03 ENCOUNTER — Ambulatory Visit: Payer: Self-pay | Admitting: Family Medicine
